# Patient Record
Sex: Female | Born: 1955
Health system: Southern US, Community
[De-identification: ages and names within clinical notes are randomized; demographics above are authoritative.]

## PROBLEM LIST (undated history)

## (undated) DIAGNOSIS — F1010 Alcohol abuse, uncomplicated: Secondary | ICD-10-CM

## (undated) DIAGNOSIS — J189 Pneumonia, unspecified organism: Secondary | ICD-10-CM

## (undated) DIAGNOSIS — K7460 Unspecified cirrhosis of liver: Secondary | ICD-10-CM

## (undated) DIAGNOSIS — N12 Tubulo-interstitial nephritis, not specified as acute or chronic: Secondary | ICD-10-CM

## (undated) DIAGNOSIS — K449 Diaphragmatic hernia without obstruction or gangrene: Secondary | ICD-10-CM

## (undated) DIAGNOSIS — K5730 Diverticulosis of large intestine without perforation or abscess without bleeding: Secondary | ICD-10-CM

## (undated) HISTORY — DX: Unspecified cirrhosis of liver: K74.60

## (undated) HISTORY — DX: Alcohol abuse, uncomplicated: F10.10

## (undated) HISTORY — DX: Diaphragmatic hernia without obstruction or gangrene: K44.9

## (undated) HISTORY — PX: TUBAL LIGATION: SHX77

## (undated) HISTORY — DX: Diverticulosis of large intestine without perforation or abscess without bleeding: K57.30

## (undated) HISTORY — DX: Pneumonia, unspecified organism: J18.9

## (undated) HISTORY — DX: Tubulo-interstitial nephritis, not specified as acute or chronic: N12

---

## 2007-06-13 ENCOUNTER — Ambulatory Visit: Payer: Self-pay | Admitting: Internal Medicine

## 2007-08-01 ENCOUNTER — Ambulatory Visit: Payer: Self-pay | Admitting: Internal Medicine

## 2007-08-06 ENCOUNTER — Ambulatory Visit: Payer: Self-pay | Admitting: Internal Medicine

## 2009-07-26 ENCOUNTER — Emergency Department: Payer: Self-pay | Admitting: Internal Medicine

## 2019-10-14 ENCOUNTER — Inpatient Hospital Stay (HOSPITAL_COMMUNITY)
Admission: EM | Admit: 2019-10-14 | Discharge: 2019-10-23 | DRG: 871 | Disposition: A | Discharge status: Home / Self Care | Payer: Self-pay | Attending: Student | Admitting: Student

## 2019-10-14 ENCOUNTER — Encounter (HOSPITAL_COMMUNITY): Payer: Self-pay

## 2019-10-14 ENCOUNTER — Other Ambulatory Visit: Payer: Self-pay

## 2019-10-14 ENCOUNTER — Emergency Department (HOSPITAL_COMMUNITY): Payer: Self-pay

## 2019-10-14 DIAGNOSIS — J9601 Acute respiratory failure with hypoxia: Secondary | ICD-10-CM | POA: Diagnosis present

## 2019-10-14 DIAGNOSIS — Z20822 Contact with and (suspected) exposure to covid-19: Secondary | ICD-10-CM | POA: Diagnosis present

## 2019-10-14 DIAGNOSIS — A419 Sepsis, unspecified organism: Secondary | ICD-10-CM

## 2019-10-14 DIAGNOSIS — F1010 Alcohol abuse, uncomplicated: Secondary | ICD-10-CM | POA: Diagnosis present

## 2019-10-14 DIAGNOSIS — J189 Pneumonia, unspecified organism: Secondary | ICD-10-CM | POA: Diagnosis present

## 2019-10-14 DIAGNOSIS — E8809 Other disorders of plasma-protein metabolism, not elsewhere classified: Secondary | ICD-10-CM | POA: Diagnosis present

## 2019-10-14 DIAGNOSIS — D509 Iron deficiency anemia, unspecified: Secondary | ICD-10-CM | POA: Diagnosis present

## 2019-10-14 DIAGNOSIS — K766 Portal hypertension: Secondary | ICD-10-CM | POA: Diagnosis present

## 2019-10-14 DIAGNOSIS — R188 Other ascites: Secondary | ICD-10-CM

## 2019-10-14 DIAGNOSIS — D72829 Elevated white blood cell count, unspecified: Secondary | ICD-10-CM

## 2019-10-14 DIAGNOSIS — K769 Liver disease, unspecified: Secondary | ICD-10-CM

## 2019-10-14 DIAGNOSIS — K5900 Constipation, unspecified: Secondary | ICD-10-CM | POA: Diagnosis present

## 2019-10-14 DIAGNOSIS — E872 Acidosis, unspecified: Secondary | ICD-10-CM

## 2019-10-14 DIAGNOSIS — E871 Hypo-osmolality and hyponatremia: Secondary | ICD-10-CM | POA: Diagnosis present

## 2019-10-14 DIAGNOSIS — K7040 Alcoholic hepatic failure without coma: Secondary | ICD-10-CM | POA: Diagnosis present

## 2019-10-14 DIAGNOSIS — D6959 Other secondary thrombocytopenia: Secondary | ICD-10-CM | POA: Diagnosis present

## 2019-10-14 DIAGNOSIS — E876 Hypokalemia: Secondary | ICD-10-CM | POA: Diagnosis present

## 2019-10-14 DIAGNOSIS — Z79899 Other long term (current) drug therapy: Secondary | ICD-10-CM

## 2019-10-14 DIAGNOSIS — D649 Anemia, unspecified: Secondary | ICD-10-CM

## 2019-10-14 DIAGNOSIS — R7989 Other specified abnormal findings of blood chemistry: Secondary | ICD-10-CM

## 2019-10-14 DIAGNOSIS — R17 Unspecified jaundice: Secondary | ICD-10-CM

## 2019-10-14 DIAGNOSIS — D696 Thrombocytopenia, unspecified: Secondary | ICD-10-CM

## 2019-10-14 DIAGNOSIS — R791 Abnormal coagulation profile: Secondary | ICD-10-CM

## 2019-10-14 DIAGNOSIS — D684 Acquired coagulation factor deficiency: Secondary | ICD-10-CM | POA: Diagnosis present

## 2019-10-14 DIAGNOSIS — E538 Deficiency of other specified B group vitamins: Secondary | ICD-10-CM | POA: Diagnosis present

## 2019-10-14 DIAGNOSIS — N179 Acute kidney failure, unspecified: Secondary | ICD-10-CM | POA: Diagnosis present

## 2019-10-14 DIAGNOSIS — K649 Unspecified hemorrhoids: Secondary | ICD-10-CM | POA: Diagnosis present

## 2019-10-14 DIAGNOSIS — F17210 Nicotine dependence, cigarettes, uncomplicated: Secondary | ICD-10-CM | POA: Diagnosis present

## 2019-10-14 DIAGNOSIS — R0902 Hypoxemia: Secondary | ICD-10-CM

## 2019-10-14 DIAGNOSIS — D539 Nutritional anemia, unspecified: Secondary | ICD-10-CM | POA: Diagnosis present

## 2019-10-14 DIAGNOSIS — K7031 Alcoholic cirrhosis of liver with ascites: Secondary | ICD-10-CM | POA: Diagnosis present

## 2019-10-14 DIAGNOSIS — A4151 Sepsis due to Escherichia coli [E. coli]: Principal | ICD-10-CM | POA: Diagnosis present

## 2019-10-14 MED ORDER — LACTATED RINGERS IV BOLUS (SEPSIS)
1000.0000 mL | Freq: Once | INTRAVENOUS | Status: AC
  Administered 2019-10-15: 1000 mL via INTRAVENOUS

## 2019-10-14 MED ORDER — VANCOMYCIN HCL IN DEXTROSE 1-5 GM/200ML-% IV SOLN
1000.0000 mg | Freq: Once | INTRAVENOUS | Status: AC
  Administered 2019-10-15: 1000 mg via INTRAVENOUS
  Filled 2019-10-14: qty 200

## 2019-10-14 MED ORDER — SODIUM CHLORIDE 0.9 % IV SOLN
2.0000 g | Freq: Once | INTRAVENOUS | Status: AC
  Administered 2019-10-15: 2 g via INTRAVENOUS
  Filled 2019-10-14: qty 2

## 2019-10-14 MED ORDER — METRONIDAZOLE IN NACL 5-0.79 MG/ML-% IV SOLN
500.0000 mg | Freq: Once | INTRAVENOUS | Status: AC
  Administered 2019-10-15: 500 mg via INTRAVENOUS
  Filled 2019-10-14: qty 100

## 2019-10-14 NOTE — ED Triage Notes (Signed)
Pt BIB [facility] from home c/o a fever x 2-3 days. Pt's daughter felt like she was more lethargic than normal and warm to the touch. EMS stated fire stated pt's temp was 103 with them and HR 150. Pt is also c/o of more abdominal distention than normal. Pt has a hx of ascites.

## 2019-10-14 NOTE — ED Provider Notes (Addendum)
[facility] EMERGENCY DEPARTMENT Provider Note   CSN: [id number] Arrival date & time: 10/14/19  2332    History Chief Complaint  Patient presents with  . Fever    Shirley Mays is a 64 y.o. female with no known past medical history who presents for evaluation of fever. Patient states she has had a fever at home x3 days. Has had a nonproductive cough. Feels like her abdomen has been larger than it normally has however denies any pain, nausea or vomiting. Per patient's daughter via EMS she felt like her mother was more lethargic. Patient was febrile with oral temp to 103 and heart rate to 150s. Per patient's daughter patient has history of ascites. Patient denies any complaints on initial evaluation. Tolerating p.o. intake at home. Has some lower extremity edema which he states is at baseline. Patient states she does not take any chronic medications and does not have a primary care provider.  History obtained patient past medical records.  No interpreter was used.  Tylenol given by EMS per nursing  Attempted to contact patient's daughter Shirley Mays at [phone].  Unable to reach x2 for collateral information.  HPI     History reviewed. No pertinent past medical history.  Patient Active Problem List   Diagnosis Date Noted  . CAP (community acquired pneumonia) 10/15/2019  . Sepsis (HCC) 10/15/2019  . Liver disease 10/15/2019  . Anemia 10/15/2019  . Thrombocytopenia (HCC) 10/15/2019    History reviewed. No pertinent surgical history.   OB History   No obstetric history on file.     History reviewed. No pertinent family history.  Social History   Tobacco Use  . Smoking status: Not on file  Substance Use Topics  . Alcohol use: Not on file  . Drug use: Not on file    Home Medications Prior to Admission medications   Not on File    Allergies    Patient has no known allergies.  Review of Systems   Review of Systems  Constitutional: Positive for  activity change, fatigue and fever. Negative for appetite change, chills and diaphoresis.  HENT: Negative.   Respiratory: Positive for cough. Negative for apnea, choking, chest tightness, shortness of breath, wheezing and stridor.   Cardiovascular: Negative.   Gastrointestinal: Positive for abdominal distention and nausea.  Genitourinary: Negative.   Musculoskeletal: Negative.   Skin: Negative.   Neurological: Positive for weakness (Generalized ). Negative for dizziness, tremors, seizures, syncope, facial asymmetry, speech difficulty, light-headedness, numbness and headaches.  All other systems reviewed and are negative.   Physical Exam Updated Vital Signs BP 102/62   Pulse (!) 107   Temp (!) 100.7 F (38.2 C) (Oral)   Resp (!) 24   Ht 5\' 7"  (1.702 m)   Wt 90.7 kg   SpO2 93%   BMI 31.32 kg/m   Physical Exam Vitals and nursing note reviewed.  Constitutional:      General: She is not in acute distress.    Appearance: She is well-developed. She is ill-appearing. She is not toxic-appearing.  HENT:     Head: Normocephalic and atraumatic.     Nose: Nose normal.     Mouth/Throat:     Mouth: Mucous membranes are dry.  Eyes:     Pupils: Pupils are equal, round, and reactive to light.     Comments: Scleral icterus   Cardiovascular:     Rate and Rhythm: Tachycardia present.     Pulses: Normal pulses.     Heart  sounds: Normal heart sounds.  Pulmonary:     Effort: Pulmonary effort is normal. No respiratory distress.     Breath sounds: Normal breath sounds.     Comments: Speaks in full sentences without difficulty. 2 L via nasal cannula for supplemental oxygen, does not use chronically at home Abdominal:     General: Bowel sounds are normal. There is distension.     Tenderness: There is no abdominal tenderness. There is no guarding or rebound.     Comments: Distended abdomen however soft, nontender.  Musculoskeletal:        General: Normal range of motion.     Cervical back:  Normal range of motion.     Comments: Moves all four extremities at difficulty. Denna HaggardHomans' sign negative. Compartments soft.  Skin:    General: Skin is warm and dry.     Capillary Refill: Capillary refill takes 2 to 3 seconds.     Comments: Jaundice. 1+ bilateral pitting edema to mid shins no erythema or warmth.  Neurological:     Mental Status: She is alert.     Comments: AxO x3. Cranial nerves II through XII grossly intact No asterixis 4.5/5 strength to bilateral upper and lower extremities without difficulty     ED Results / Procedures / Treatments   Labs (all labs ordered are listed, but only abnormal results are displayed) Labs Reviewed  LACTIC ACID, PLASMA - Abnormal; Notable for the following components:      Result Value   Lactic Acid, Venous 4.4 (*)    All other components within normal limits  LACTIC ACID, PLASMA - Abnormal; Notable for the following components:   Lactic Acid, Venous 3.2 (*)    All other components within normal limits  COMPREHENSIVE METABOLIC PANEL - Abnormal; Notable for the following components:   Sodium 128 (*)    Potassium 3.2 (*)    Chloride 93 (*)    CO2 20 (*)    Glucose, Bld 106 (*)    BUN 27 (*)    Creatinine, Ser 1.29 (*)    Calcium 8.0 (*)    Albumin 2.2 (*)    AST 162 (*)    Total Bilirubin 4.8 (*)    GFR calc non Af Amer 44 (*)    GFR calc Af Amer 51 (*)    All other components within normal limits  CBC WITH DIFFERENTIAL/PLATELET - Abnormal; Notable for the following components:   WBC 17.8 (*)    RBC 2.98 (*)    Hemoglobin 11.1 (*)    HCT 32.4 (*)    MCV 108.7 (*)    MCH 37.2 (*)    RDW 16.6 (*)    Platelets 123 (*)    Neutro Abs 14.0 (*)    Monocytes Absolute 2.3 (*)    Abs Immature Granulocytes 0.19 (*)    All other components within normal limits  APTT - Abnormal; Notable for the following components:   aPTT 47 (*)    All other components within normal limits  PROTIME-INR - Abnormal; Notable for the following  components:   Prothrombin Time 24.6 (*)    INR 2.3 (*)    All other components within normal limits  AMMONIA - Abnormal; Notable for the following components:   Ammonia 76 (*)    All other components within normal limits  SARS CORONAVIRUS 2 BY RT PCR (HOSPITAL ORDER, PERFORMED IN [facility])  CULTURE, BLOOD (ROUTINE X 2)  CULTURE, BLOOD (ROUTINE X 2)  URINE CULTURE  LIPASE, BLOOD  PROCALCITONIN  URINALYSIS, ROUTINE W REFLEX MICROSCOPIC  BRAIN NATRIURETIC PEPTIDE  HIV ANTIBODY (ROUTINE TESTING W REFLEX)  VITAMIN B12  FOLATE  CBC  OSMOLALITY  SODIUM, URINE, RANDOM  OSMOLALITY, URINE  BASIC METABOLIC PANEL  MAGNESIUM  PROTIME-INR    EKG EKG Interpretation  Date/Time:  Tuesday October 14 2019 23:52:42 EDT Ventricular Rate:  115 PR Interval:    QRS Duration: 93 QT Interval:  364 QTC Calculation: 504 R Axis:   32 Text Interpretation: Sinus tachycardia Borderline low voltage, extremity leads Prolonged QT interval Confirmed by Zadie Rhine ([id number]) on 10/14/2019 11:53:38 PM   Radiology DG Chest Port 1 View  Result Date: 10/15/2019 CLINICAL DATA:  Cough EXAM: PORTABLE CHEST 1 VIEW COMPARISON:  July 18, 2009 FINDINGS: There is mild cardiomegaly. Hazy patchy airspace consolidation seen at the left lung base. Mildly increased interstitial markings seen within the left upper lung. The right lung is clear. No acute osseous abnormality. IMPRESSION: Hazy patchy airspace consolidation at the left lung base, concerning for pneumonia. Electronically Signed   By: Jonna Clark M.D.   On: 10/15/2019 00:21    Procedures .Critical Care Performed by: Linwood Dibbles, PA-C Authorized by: Linwood Dibbles, PA-C   Critical care provider statement:    Critical care time (minutes):  45   Critical care time was exclusive of:  Separately billable procedures and treating other patients and teaching time   Critical care was necessary to treat or prevent imminent or  life-threatening deterioration of the following conditions:  Sepsis, dehydration and hepatic failure   Critical care was time spent personally by me on the following activities:  Discussions with consultants, evaluation of patient's response to treatment, examination of patient, ordering and performing treatments and interventions, ordering and review of laboratory studies, ordering and review of radiographic studies, pulse oximetry, re-evaluation of patient's condition, obtaining history from patient or surrogate and review of old charts   (including critical care time)  Medications Ordered in ED Medications  vancomycin (VANCOCIN) IVPB 1000 mg/200 mL premix (1,000 mg Intravenous New Bag/Given 10/15/19 0215)  potassium chloride 10 mEq in 100 mL IVPB (has no administration in time range)  cefTRIAXone (ROCEPHIN) 1 g in sodium chloride 0.9 % 100 mL IVPB (has no administration in time range)  azithromycin (ZITHROMAX) 500 mg in sodium chloride 0.9 % 250 mL IVPB (has no administration in time range)  LORazepam (ATIVAN) tablet 1-4 mg (has no administration in time range)    Or  LORazepam (ATIVAN) injection 1-4 mg (has no administration in time range)  thiamine tablet 100 mg (has no administration in time range)    Or  thiamine (B-1) injection 100 mg (has no administration in time range)  folic acid (FOLVITE) tablet 1 mg (has no administration in time range)  multivitamin with minerals tablet 1 tablet (has no administration in time range)  0.9 %  sodium chloride infusion (has no administration in time range)  acetaminophen (TYLENOL) tablet 650 mg (has no administration in time range)    Or  acetaminophen (TYLENOL) suppository 650 mg (has no administration in time range)  lactated ringers bolus 1,000 mL (0 mLs Intravenous Stopped 10/15/19 0142)  ceFEPIme (MAXIPIME) 2 g in sodium chloride 0.9 % 100 mL IVPB (0 g Intravenous Stopped 10/15/19 0116)  metroNIDAZOLE (FLAGYL) IVPB 500 mg (0 mg Intravenous  Stopped 10/15/19 0243)  sodium chloride 0.9 % bolus 2,000 mL (2,000 mLs Intravenous Bolus from Bag 10/15/19 0143)   ED Course  I have  reviewed the triage vital signs and the nursing notes.  Pertinent labs & imaging results that were available during my care of the patient were reviewed by me and considered in my medical decision making (see chart for details).  64 year old female presents for evaluation of fever.  On arrival she is febrile, tachycardic, tachypneic with oxygen saturation at 90% on room air.  Patient states she has had a cough.  No known Covid contacts.  On arrival code sepsis was called with broad-spectrum antibiotics.  Does have some abdominal distention however is nontender to palpation.  No diarrhea or urinary complaints.  She does have scleral icterus and appears jaundiced.  Admits to chronic alcohol use.  Last use yesterday.  Denies prior DTs or withdrawal seizures.  Per daughter via EMS patient has history of ascites however patient denies any known liver issues.  She is not followed by PCP.  Plan on labs, imaging and reassess.  Started with 1 L of fluids pending lactic.  We will add an additional fluids if MAP trends downwards her lactic acid is greater than 4.  Patient mentating appropriately.  AxO x4. Tylenol given by EMS PTA.  Labs and imaging personally viewed and interpreted: CBC with leukocytosis at 17.8, hemoglobin 16.1 Metabolic panel with hyponatremia to 128, hypokalemia, AKI at 1.29, T bili 4.8 Ammonia 76 Lipase 50 Lactic acid 4.4--> once lactic acid resulted, remaining fluids to total 30/cc/kg bolus consistent with sepsis order set ordered. Trending down to 3.2 Covid negative Procalcitonin 3.31 INR 2.3, patient denies anticoagulation Chest x-ray with consolidation in the left lung base consistent with pneumonia EKG tachycardia, prolonged QT interval  Patient reassessed. States she lives with her daughter Shirley Mays. I try to contact her x2 for additional collateral  information however patient's contact is not answering the telephone. Patient again denies any chronic medical conditions or per EMS has history of ascites, suspect her elevated INR, bili from liver failure. Has prior history of DTs or withdrawal seizures however admits to chronic alcohol use, CIWA order set monitor for possible withdrawal however does not appear to be actively withdrawing from alcohol at this time.  Patient with elevated ammonia however no asterixis on exam. She is mentating appropriately. Abdomen distended however soft. Low suspicion for SB or additional abdominal pathology given nontender abdomen on exam. Patient without any chest pain, no evidence of DVT on exam. Low suspicion for ACS, PE, dissection as cause of her abnormal vital signs. No evidence of septic shock at this time.  Sepsis - Repeat Assessment  Performed at:      Vitals     Blood pressure 102/62, pulse (!) 107, temperature (!) 100.7 F (38.2 C), temperature source Oral, resp. rate (!) 24, height 5\' 7"  (1.702 m), weight 90.7 kg, SpO2 93 %.  Heart:     Tachycardic  Lungs:    Rhonchi  Capillary Refill:   > 2 sec  Peripheral Pulse:   Radial pulse palpable  Skin:     Jaundice  Patient reassessed. Discussed lab and imaging findings. Discussed admission. She is agreeable to this. Patient will be admitted for sepsis likely due to pneumonia on chest x-ray. Will also need further evaluation for her possible liver failure. She has been given broad-spectrum antibiotics and 30 cc/kg bolus. Patient with maps greater than 65. Electolytes replaced in ED.  CONSULT with Dr. Marlowe Sax with [facility] who will evaluate patient for admission  The patient appears reasonably stabilized for admission considering the current resources, flow, and capabilities available in  the ED at this time, and I doubt any other [facility] requiring further screening and/or treatment in the ED prior to admission.  Attending Dr. Bebe Shaggy has evaluated patient. He  agrees above treatment, plan and disposition.    MDM Rules/Calculators/A&P                           Final Clinical Impression(s) / ED Diagnoses Final diagnoses:  Sepsis with acute hypoxic respiratory failure without septic shock, due to unspecified organism (HCC)  Elevated INR  Elevated bilirubin  Hyponatremia  Lactic acidosis  Hypokalemia  AKI (acute kidney injury) (HCC)  Elevated LFTs    Rx / DC Orders ED Discharge Orders    None       Terence Googe A, PA-C 10/15/19 0255    Mehar Sagen A, PA-C 10/15/19 0256    Zadie Rhine, MD 10/15/19 [phone]

## 2019-10-15 ENCOUNTER — Inpatient Hospital Stay (HOSPITAL_COMMUNITY): Payer: Self-pay

## 2019-10-15 ENCOUNTER — Emergency Department (HOSPITAL_COMMUNITY): Payer: Self-pay

## 2019-10-15 ENCOUNTER — Encounter (HOSPITAL_COMMUNITY): Payer: Self-pay | Admitting: Internal Medicine

## 2019-10-15 DIAGNOSIS — A419 Sepsis, unspecified organism: Secondary | ICD-10-CM

## 2019-10-15 DIAGNOSIS — K769 Liver disease, unspecified: Secondary | ICD-10-CM

## 2019-10-15 DIAGNOSIS — D649 Anemia, unspecified: Secondary | ICD-10-CM

## 2019-10-15 DIAGNOSIS — D696 Thrombocytopenia, unspecified: Secondary | ICD-10-CM

## 2019-10-15 DIAGNOSIS — J189 Pneumonia, unspecified organism: Secondary | ICD-10-CM | POA: Diagnosis present

## 2019-10-15 LAB — LACTIC ACID, PLASMA
Lactic Acid, Venous: 4.4 mmol/L (ref 0.5–1.9)
Lactic Acid, Venous: 3.2 mmol/L (ref 0.5–1.9)
Lactic Acid, Venous: 2.4 mmol/L (ref 0.5–1.9)

## 2019-10-15 LAB — CBC WITH DIFFERENTIAL/PLATELET
Abs Immature Granulocytes: 0.19 10*3/uL — ABNORMAL HIGH (ref 0.00–0.07)
Basophils Absolute: 0.1 10*3/uL (ref 0.0–0.1)
Basophils Relative: 0 %
Eosinophils Absolute: 0.1 10*3/uL (ref 0.0–0.5)
Eosinophils Relative: 0 %
HCT: 32.4 % — ABNORMAL LOW (ref 36.0–46.0)
Hemoglobin: 11.1 g/dL — ABNORMAL LOW (ref 12.0–15.0)
Immature Granulocytes: 1 %
Lymphocytes Relative: 7 %
Lymphs Abs: 1.3 10*3/uL (ref 0.7–4.0)
MCH: 37.2 pg — ABNORMAL HIGH (ref 26.0–34.0)
MCHC: 34.3 g/dL (ref 30.0–36.0)
MCV: 108.7 fL — ABNORMAL HIGH (ref 80.0–100.0)
Monocytes Absolute: 2.3 10*3/uL — ABNORMAL HIGH (ref 0.1–1.0)
Monocytes Relative: 13 %
Neutro Abs: 14 10*3/uL — ABNORMAL HIGH (ref 1.7–7.7)
Neutrophils Relative %: 79 %
Platelets: 123 10*3/uL — ABNORMAL LOW (ref 150–400)
RBC: 2.98 MIL/uL — ABNORMAL LOW (ref 3.87–5.11)
RDW: 16.6 % — ABNORMAL HIGH (ref 11.5–15.5)
WBC: 17.8 10*3/uL — ABNORMAL HIGH (ref 4.0–10.5)
nRBC: 0.1 % (ref 0.0–0.2)

## 2019-10-15 LAB — CBC
HCT: 31.5 % — ABNORMAL LOW (ref 36.0–46.0)
Hemoglobin: 10.8 g/dL — ABNORMAL LOW (ref 12.0–15.0)
MCH: 37.1 pg — ABNORMAL HIGH (ref 26.0–34.0)
MCHC: 34.3 g/dL (ref 30.0–36.0)
MCV: 108.2 fL — ABNORMAL HIGH (ref 80.0–100.0)
Platelets: 112 10*3/uL — ABNORMAL LOW (ref 150–400)
RBC: 2.91 MIL/uL — ABNORMAL LOW (ref 3.87–5.11)
RDW: 16.6 % — ABNORMAL HIGH (ref 11.5–15.5)
WBC: 16.5 10*3/uL — ABNORMAL HIGH (ref 4.0–10.5)
nRBC: 0.1 % (ref 0.0–0.2)

## 2019-10-15 LAB — SARS CORONAVIRUS 2 BY RT PCR (HOSPITAL ORDER, PERFORMED IN ~~LOC~~ HOSPITAL LAB): SARS Coronavirus 2: NEGATIVE

## 2019-10-15 LAB — BASIC METABOLIC PANEL
Anion gap: 9 (ref 5–15)
BUN: 23 mg/dL (ref 8–23)
CO2: 23 mmol/L (ref 22–32)
Calcium: 7.6 mg/dL — ABNORMAL LOW (ref 8.9–10.3)
Chloride: 98 mmol/L (ref 98–111)
Creatinine, Ser: 1.09 mg/dL — ABNORMAL HIGH (ref 0.44–1.00)
GFR calc Af Amer: >60 mL/min (ref >60)
GFR calc non Af Amer: 54 mL/min — ABNORMAL LOW (ref >60)
Glucose, Bld: 111 mg/dL — ABNORMAL HIGH (ref 70–99)
Potassium: 3 mmol/L — ABNORMAL LOW (ref 3.5–5.1)
Sodium: 130 mmol/L — ABNORMAL LOW (ref 135–145)

## 2019-10-15 LAB — BLOOD CULTURE ID PANEL (REFLEXED)

## 2019-10-15 LAB — COMPREHENSIVE METABOLIC PANEL
ALT: 41 U/L (ref 0–44)
AST: 162 U/L — ABNORMAL HIGH (ref 15–41)
Albumin: 2.2 g/dL — ABNORMAL LOW (ref 3.5–5.0)
Alkaline Phosphatase: 50 U/L (ref 38–126)
Anion gap: 15 (ref 5–15)
BUN: 27 mg/dL — ABNORMAL HIGH (ref 8–23)
CO2: 20 mmol/L — ABNORMAL LOW (ref 22–32)
Calcium: 8 mg/dL — ABNORMAL LOW (ref 8.9–10.3)
Chloride: 93 mmol/L — ABNORMAL LOW (ref 98–111)
Creatinine, Ser: 1.29 mg/dL — ABNORMAL HIGH (ref 0.44–1.00)
GFR calc Af Amer: 51 mL/min — ABNORMAL LOW (ref >60)
GFR calc non Af Amer: 44 mL/min — ABNORMAL LOW (ref >60)
Glucose, Bld: 106 mg/dL — ABNORMAL HIGH (ref 70–99)
Potassium: 3.2 mmol/L — ABNORMAL LOW (ref 3.5–5.1)
Sodium: 128 mmol/L — ABNORMAL LOW (ref 135–145)
Total Bilirubin: 4.8 mg/dL — ABNORMAL HIGH (ref 0.3–1.2)
Total Protein: 6.8 g/dL (ref 6.5–8.1)

## 2019-10-15 LAB — PROTIME-INR
INR: 2.3 — ABNORMAL HIGH (ref 0.8–1.2)
INR: 2.4 — ABNORMAL HIGH (ref 0.8–1.2)
Prothrombin Time: 24.6 seconds — ABNORMAL HIGH (ref 11.4–15.2)
Prothrombin Time: 25.7 seconds — ABNORMAL HIGH (ref 11.4–15.2)

## 2019-10-15 LAB — VITAMIN B12: Vitamin B-12: 500 pg/mL (ref 180–914)

## 2019-10-15 LAB — MAGNESIUM: Magnesium: 1.4 mg/dL — ABNORMAL LOW (ref 1.7–2.4)

## 2019-10-15 LAB — AMMONIA: Ammonia: 76 umol/L — ABNORMAL HIGH (ref 9–35)

## 2019-10-15 LAB — BRAIN NATRIURETIC PEPTIDE: B Natriuretic Peptide: 114.9 pg/mL — ABNORMAL HIGH (ref 0.0–100.0)

## 2019-10-15 LAB — PROCALCITONIN: Procalcitonin: 3.31 ng/mL

## 2019-10-15 LAB — APTT: aPTT: 47 seconds — ABNORMAL HIGH (ref 24–36)

## 2019-10-15 LAB — OSMOLALITY: Osmolality: 279 mOsm/kg (ref 275–295)

## 2019-10-15 LAB — LIPASE, BLOOD: Lipase: 50 U/L (ref 11–51)

## 2019-10-15 LAB — FOLATE: Folate: 5.9 ng/mL — ABNORMAL LOW (ref >5.9)

## 2019-10-15 LAB — GLUCOSE, CAPILLARY: Glucose-Capillary: 129 mg/dL — ABNORMAL HIGH (ref 70–99)

## 2019-10-15 LAB — HIV ANTIBODY (ROUTINE TESTING W REFLEX): HIV Screen 4th Generation wRfx: NONREACTIVE

## 2019-10-15 MED ORDER — ADULT MULTIVITAMIN W/MINERALS CH
1.0000 | ORAL_TABLET | Freq: Every day | ORAL | Status: DC
  Administered 2019-10-15 – 2019-10-23 (×9): 1 via ORAL
  Filled 2019-10-15 (×9): qty 1

## 2019-10-15 MED ORDER — POTASSIUM CHLORIDE 10 MEQ/100ML IV SOLN: 10.0000 meq | INTRAVENOUS | Status: AC

## 2019-10-15 MED ORDER — ACETAMINOPHEN 325 MG PO TABS
650.0000 mg | ORAL_TABLET | Freq: Four times a day (QID) | ORAL | Status: DC | PRN
  Administered 2019-10-15 – 2019-10-23 (×5): 650 mg via ORAL
  Filled 2019-10-15 (×5): qty 2

## 2019-10-15 MED ORDER — LORAZEPAM 1 MG PO TABS: 1.0000 mg | ORAL_TABLET | ORAL | Status: AC | PRN

## 2019-10-15 MED ORDER — LORAZEPAM 2 MG/ML IJ SOLN: 1.0000 mg | INTRAMUSCULAR | Status: AC | PRN

## 2019-10-15 MED ORDER — SODIUM CHLORIDE 0.9 % IV SOLN: INTRAVENOUS | Status: DC

## 2019-10-15 MED ORDER — SODIUM CHLORIDE 0.9% FLUSH: 10.0000 mL | INTRAVENOUS | Status: DC | PRN

## 2019-10-15 MED ORDER — SODIUM CHLORIDE 0.9 % IV BOLUS
500.0000 mL | Freq: Once | INTRAVENOUS | Status: AC
  Administered 2019-10-15: 500 mL via INTRAVENOUS

## 2019-10-15 MED ORDER — LACTULOSE 10 GM/15ML PO SOLN
30.0000 g | Freq: Two times a day (BID) | ORAL | Status: DC
  Administered 2019-10-15 (×2): 30 g via ORAL
  Filled 2019-10-15 (×3): qty 45

## 2019-10-15 MED ORDER — GUAIFENESIN-DM 100-10 MG/5ML PO SYRP: 5.0000 mL | ORAL_SOLUTION | Freq: Four times a day (QID) | ORAL | Status: DC | PRN

## 2019-10-15 MED ORDER — PHYTONADIONE 5 MG PO TABS
10.0000 mg | ORAL_TABLET | Freq: Once | ORAL | Status: AC
  Administered 2019-10-15: 10 mg via ORAL
  Filled 2019-10-15: qty 2

## 2019-10-15 MED ORDER — SODIUM CHLORIDE 0.9 % IV BOLUS
2000.0000 mL | Freq: Once | INTRAVENOUS | Status: AC
  Administered 2019-10-15: 2000 mL via INTRAVENOUS

## 2019-10-15 MED ORDER — MAGNESIUM SULFATE 2 GM/50ML IV SOLN
2.0000 g | Freq: Once | INTRAVENOUS | Status: AC
  Administered 2019-10-15: 2 g via INTRAVENOUS
  Filled 2019-10-15: qty 50

## 2019-10-15 MED ORDER — THIAMINE HCL 100 MG PO TABS
100.0000 mg | ORAL_TABLET | Freq: Every day | ORAL | Status: DC
  Administered 2019-10-15 – 2019-10-23 (×9): 100 mg via ORAL
  Filled 2019-10-15 (×9): qty 1

## 2019-10-15 MED ORDER — FOLIC ACID 1 MG PO TABS
1.0000 mg | ORAL_TABLET | Freq: Every day | ORAL | Status: DC
  Administered 2019-10-15 – 2019-10-23 (×9): 1 mg via ORAL
  Filled 2019-10-15 (×9): qty 1

## 2019-10-15 MED ORDER — POTASSIUM CHLORIDE CRYS ER 20 MEQ PO TBCR
40.0000 meq | EXTENDED_RELEASE_TABLET | Freq: Two times a day (BID) | ORAL | Status: AC
  Administered 2019-10-15 (×2): 40 meq via ORAL
  Filled 2019-10-15 (×2): qty 2

## 2019-10-15 MED ORDER — ACETAMINOPHEN 650 MG RE SUPP: 650.0000 mg | Freq: Four times a day (QID) | RECTAL | Status: DC | PRN

## 2019-10-15 MED ORDER — SODIUM CHLORIDE 0.9 % IV SOLN
2.0000 g | INTRAVENOUS | Status: DC
  Administered 2019-10-15 – 2019-10-19 (×5): 2 g via INTRAVENOUS
  Filled 2019-10-15: qty 20
  Filled 2019-10-15 (×2): qty 2
  Filled 2019-10-15 (×2): qty 20
  Filled 2019-10-15: qty 2
  Filled 2019-10-15: qty 20

## 2019-10-15 MED ORDER — THIAMINE HCL 100 MG/ML IJ SOLN: 100.0000 mg | Freq: Every day | INTRAMUSCULAR | Status: DC

## 2019-10-15 MED ORDER — SODIUM CHLORIDE 0.9 % IV SOLN: 2.0000 g | Freq: Two times a day (BID) | INTRAVENOUS | Status: DC

## 2019-10-15 MED ORDER — SODIUM CHLORIDE 0.9 % IV BOLUS
250.0000 mL | Freq: Once | INTRAVENOUS | Status: AC
  Administered 2019-10-15: 250 mL via INTRAVENOUS

## 2019-10-15 MED ORDER — SODIUM CHLORIDE 0.9 % IV SOLN
500.0000 mg | INTRAVENOUS | Status: DC
  Administered 2019-10-15 – 2019-10-19 (×5): 500 mg via INTRAVENOUS
  Filled 2019-10-15 (×5): qty 500

## 2019-10-15 MED ORDER — VANCOMYCIN HCL 1250 MG/250ML IV SOLN: 1250.0000 mg | INTRAVENOUS | Status: DC

## 2019-10-15 MED ORDER — SODIUM CHLORIDE 0.9 % IV SOLN
1.0000 g | INTRAVENOUS | Status: DC
  Filled 2019-10-15: qty 10

## 2019-10-15 NOTE — Progress Notes (Signed)
Pharmacy Antibiotic Note  Shirley Mays is a 64 y.o. female admitted on 10/14/2019 with sepsis.  Pharmacy has been consulted for vancomycin and cefepime dosing. Vancomycin 1gm and cefepime 2gm ordered in the ED  Plan: Continue vancomycin 1250 mg IV q24 hours Cont cefepime 2gm IV q12 hours F/u renal function, cultures and clinical course  Height: 5\' 7"  (170.2 cm) Weight: 90.7 kg (200 lb) IBW/kg (Calculated) : 61.6  Temp (24hrs), Avg:100.7 F (38.2 C), Min:100.7 F (38.2 C), Max:100.7 F (38.2 C)  Recent Labs  Lab 10/14/19 2357  WBC 17.8*  CREATININE 1.29*  LATICACIDVEN 4.4*    Estimated Creatinine Clearance: 50.9 mL/min (A) (by C-G formula based on SCr of 1.29 mg/dL (H)).    No Known Allergies  Thank you for allowing pharmacy to be a part of this patient's care.  10/16/19 [place] 10/15/2019 1:24 AM

## 2019-10-15 NOTE — ED Provider Notes (Signed)
Patient seen/examined in the Emergency Department in conjunction with Advanced Practice Provider  Patient reports fever and fatigue Exam : Awake alert, no acute distress.  Patient is febrile and tachycardic Plan: Code sepsis has been called.  We will follow closely    Zadie Rhine, MD 10/15/19 0006

## 2019-10-15 NOTE — H&P (Signed)
History and Physical    Shirley Mays BEE:[id number] DOB: 24-Jan-1956 DOA: 10/14/2019  PCP: Patient, No Pcp Per Patient coming from: Home  Chief Complaint: Fever, lethargy  HPI: Shirley Mays is a 64 y.o. female with no documented past medical history presenting to the ED via EMS for evaluation of fever and lethargy.  Temperature 103 F with EMS and heart rate in the 150s.  Daughter told EMS that patient has a history of ascites.  Patient reports 4-day history of fevers, chills, cough, shortness of breath, and generalized weakness.  Reports longstanding history of daily alcohol use, 30+ years.  ED Course: Febrile, tachycardic, and tachypneic.  Not hypotensive.  Oxygen saturation 88% on room air, placed on 4 L supplemental oxygen.  Labs showing leukocytosis with WBC count 17.8.  Lactic acid 4.4.  Procalcitonin elevated at 3.31.  Hemoglobin 11.1 and MCV 108.  Platelet count 123.  No prior labs for comparison.  Sodium 128, potassium 3.2, BUN 27, creatinine 1.2.  AST 162 and T bili 4.8.  ALT and alk phos normal.  Lipase normal.  INR 2.3.  Ammonia slightly elevated at 76.  UA and urine culture pending.  Blood culture x2 pending.  SARS-CoV-2 PCR test negative.  Chest x-ray personally reviewed showing consolidation at the left lung base concerning for pneumonia.  Patient was given vancomycin, cefepime, metronidazole, and 3 L IV fluid boluses.  Review of Systems:  All systems reviewed and apart from history of presenting illness, are negative.  History reviewed. No pertinent past medical history.  Past Surgical History:  Procedure Laterality Date  . TUBAL LIGATION       reports that she has been smoking cigarettes. She has never used smokeless tobacco. She reports current alcohol use. She reports that she does not use drugs.  No Known Allergies  History reviewed. No pertinent family history.  Prior to Admission medications   Not on File    Physical Exam: Vitals:   10/15/19 0145 10/15/19 0200  10/15/19 0230 10/15/19 0250  BP: (!) 106/56 104/62 92/61 102/62  Pulse: (!) 103 (!) 103 99 (!) 107  Resp: (!) 22 20 (!) 24   Temp:      TempSrc:      SpO2: 95% 95% 93%   Weight:      Height:        Physical Exam Constitutional:      General: She is not in acute distress.    Appearance: She is ill-appearing. She is not diaphoretic.  HENT:     Head: Normocephalic.     Mouth/Throat:     Mouth: Mucous membranes are dry.  Eyes:     Extraocular Movements: Extraocular movements intact.  Cardiovascular:     Rate and Rhythm: Regular rhythm. Tachycardia present.     Pulses: Normal pulses.  Pulmonary:     Effort: Pulmonary effort is normal.     Breath sounds: No wheezing.     Comments: Decreased breath sounds at the left lung base Abdominal:     General: Bowel sounds are normal. There is distension.     Palpations: Abdomen is soft.     Tenderness: There is no abdominal tenderness. There is no guarding.     Comments: Mild distention  Musculoskeletal:     Cervical back: Normal range of motion and neck supple.     Right lower leg: Edema present.     Left lower leg: Edema present.     Comments: +3 pedal edema bilaterally  Skin:  General: Skin is warm and dry.  Neurological:     Mental Status: She is alert and oriented to person, place, and time.     Labs on Admission: I have personally reviewed following labs and imaging studies  CBC: Recent Labs  Lab 10/14/19 2357  WBC 17.8*  NEUTROABS 14.0*  HGB 11.1*  HCT 32.4*  MCV 108.7*  PLT 786*   Basic Metabolic Panel: Recent Labs  Lab 10/14/19 2357  NA 128*  K 3.2*  CL 93*  CO2 20*  GLUCOSE 106*  BUN 27*  CREATININE 1.29*  CALCIUM 8.0*   GFR: Estimated Creatinine Clearance: 50.9 mL/min (A) (by C-G formula based on SCr of 1.29 mg/dL (H)). Liver Function Tests: Recent Labs  Lab 10/14/19 2357  AST 162*  ALT 41  ALKPHOS 50  BILITOT 4.8*  PROT 6.8  ALBUMIN 2.2*   Recent Labs  Lab 10/14/19 2357  LIPASE 50     Recent Labs  Lab 10/14/19 2357  AMMONIA 76*   Coagulation Profile: Recent Labs  Lab 10/14/19 2357  INR 2.3*   Cardiac Enzymes: No results for input(s): CKTOTAL, CKMB, CKMBINDEX, TROPONINI in the last 168 hours. BNP (last 3 results) No results for input(s): PROBNP in the last 8760 hours. HbA1C: No results for input(s): HGBA1C in the last 72 hours. CBG: No results for input(s): GLUCAP in the last 168 hours. Lipid Profile: No results for input(s): CHOL, HDL, LDLCALC, TRIG, CHOLHDL, LDLDIRECT in the last 72 hours. Thyroid Function Tests: No results for input(s): TSH, T4TOTAL, FREET4, T3FREE, THYROIDAB in the last 72 hours. Anemia Panel: No results for input(s): VITAMINB12, FOLATE, FERRITIN, TIBC, IRON, RETICCTPCT in the last 72 hours. Urine analysis: No results found for: COLORURINE, APPEARANCEUR, [place], [place], [place], [place], [place], [place], [place], [place], [place], [place]  Radiological Exams on Admission: DG Chest Port 1 View  Result Date: 10/15/2019 CLINICAL DATA:  Cough EXAM: PORTABLE CHEST 1 VIEW COMPARISON:  July 18, 2009 FINDINGS: There is mild cardiomegaly. Hazy patchy airspace consolidation seen at the left lung base. Mildly increased interstitial markings seen within the left upper lung. The right lung is clear. No acute osseous abnormality. IMPRESSION: Hazy patchy airspace consolidation at the left lung base, concerning for pneumonia. Electronically Signed   By: Prudencio Pair M.D.   On: 10/15/2019 00:21    EKG: Independently reviewed.  Sinus tachycardia, QTC 504.  Assessment/Plan Principal Problem:   CAP (community acquired pneumonia) Active Problems:   Sepsis ([place])   Liver disease   Anemia   Thrombocytopenia ([place])   Sepsis and acute hypoxemic respiratory failure secondary to community-acquired pneumonia: Febrile, tachycardic, and tachypneic.  Not hypotensive.  Oxygen saturation 88% on room air, currently satting well on 4 L  supplemental oxygen WBC count 17.8.  Lactic acid 4.4.  Procalcitonin elevated at 3.31.  SARS-CoV-2 PCR test negative.  Chest x-ray personally reviewed showing consolidation at the left lung base concerning for pneumonia. -Ceftriaxone and azithromycin.  Patient received IV fluid boluses per sepsis protocol, continue IV fluid hydration.  Blood culture x2 pending. Trend lactate. Continue to monitor CBC. -Continuous pulse ox, supplemental oxygen to keep oxygen saturation above 92%  Liver disease/ coagulopathy: Has mild abdominal distention on exam.  AST 162 and T bili 4.8.  ALT and alk phos normal. INR 2.3.  Ammonia slightly elevated at 76.  Could possibly have underlying liver cirrhosis given history of alcohol use. -Right upper quadrant ultrasound. Hold lactulose at this time as she does not appear encephalopathic.  Continue to monitor  PT/INR.  Alcohol use disorder: Patient reports longstanding history of daily alcohol use.  Currently slightly tachycardic in the setting of sepsis. -CIWA protocol; Ativan as needed. Thiamine, folate, and multivitamin.  Macrocytic anemia: Hemoglobin 11.1 and MCV 108.  Likely related to alcohol use. -Check folate and B12 levels  Mild thrombocytopenia: Platelet count 123.  Likely related to alcohol use and underlying liver disease. -Continue to monitor  Hyponatremia: Possibly related to underlying liver disease in the setting of alcohol use.  Sodium 128, no prior labs for comparison. -IV fluid hydration.  Check serum osmolarity, urine sodium and urine osmolarity.  Repeat BMP in a.m.  Hypokalemia: Potassium 3.2. -Replete potassium.  Check magnesium level and replete if low.  Continue to monitor electrolytes.  DVT prophylaxis: SCDs given elevated INR Code Status: Full code Family Communication: No family available this time. Disposition Plan: Status is: Inpatient  Remains inpatient appropriate because:Ongoing diagnostic testing needed not appropriate for outpatient  work up, IV treatments appropriate due to intensity of illness or inability to take PO and Inpatient level of care appropriate due to severity of illness   Dispo: The patient is from: Home              Anticipated d/c is to: Home              Anticipated d/c date is: 3 days              Patient currently is not medically stable to d/c.  The medical decision making on this patient was of high complexity and the patient is at high risk for clinical deterioration, therefore this is a level 3 visit.  Shela Leff MD [facility]  If 7PM-7AM, please contact night-coverage [email or web address]  10/15/2019, 3:09 AM

## 2019-10-15 NOTE — Progress Notes (Signed)
PROGRESS NOTE    Shirley Mays  NWG:[id number] DOB: 1956-04-21 DOA: 10/14/2019 PCP: Patient, No Pcp Per   Brief Narrative: 64 year old with no documented past medical history presented to the ED for evaluation of fever and lethargy.  Patient was found to be febrile temperature 103, tachycardic.  Daughter told EMS that patient has a history of ascites.  She reports 4 days of fever, chills, cough shortness of breath.  Reports a longstanding history of daily alcohol use more than 30 years.  Patient admitted with sepsis, lactic acid 4.4, leukocytosis, compensator cirrhosis Monia bacteremia.   Assessment & Plan:   Principal Problem:   CAP (community acquired pneumonia) Active Problems:   Sepsis (HCC)   Liver disease   Anemia   Thrombocytopenia (HCC)  1-acute hypoxic respiratory failure secondary to pneumonia: Repeat a chest x-ray this morning showed some pulmonary edema as well. Continue with IV antibiotics ceftriaxone and azithromycin. Stop IV fluids. Continue with oxygen supplementation currently at 4 L.  2-sepsis secondary to pneumonia found to have E. coli bacteremia. Continue with IV ceftriaxone.  3-Liver disease coagulopathy, increased bilirubin. Likely has cirrhosis Ultrasound without significant ascites findings consistent with cirrhosis Ammonia level elevated start lactulose. Vitamin K.  4-Alcohol  use disorder: Continue to monitor on CIWA protocol 5-hypomagnesemia: Replete magnesium 6-hyponatremia: We will see if IV fluids 7-hypokalemia: Continue with potassium supplementation 8-anemia: Folic acid deficiency: Started on supplementation  Estimated body mass index is 31.32 kg/m as calculated from the following:   Height as of this encounter: 5\' 7"  (1.702 m).   Weight as of this encounter: 90.7 kg.   DVT prophylaxis: SCDs Code Status: Full code Family Communication: Care discussed with patient Disposition Plan:  Status is: Inpatient  Remains inpatient appropriate  because:Hemodynamically unstable   Dispo: The patient is from: Home              Anticipated d/c is to: Home              Anticipated d/c date is: 3 days              Patient currently is not medically stable to d/c.        Consultants:   None  Procedures:  Right upper quadrant ultrasound:Layering sludge/stones. No definite evidence of acute cholecystitis.  Findings suggestive of cirrhosis  Small amount of perihepatic ascites.    Antimicrobials:  Ceftriaxone and azithromycin  Subjective: Patient is a sleepy, wake up answer questions.  She report feeling a little bit better today.  Report mild cough.  Denies worsening shortness of breath  Objective: Vitals:   10/15/19 0230 10/15/19 0250 10/15/19 0414 10/15/19 0750  BP: 92/61 102/62 114/62 120/60  Pulse: 99 (!) 107 (!) 102 (!) 116  Resp: (!) 24  18 18   Temp:   98.9 F (37.2 C) 98.6 F (37 C)  TempSrc:   Oral   SpO2: 93%  90% 91%  Weight:      Height:        Intake/Output Summary (Last 24 hours) at 10/15/2019 1527 Last data filed at 10/15/2019 1517 Gross per 24 hour  Intake 3056.35 ml  Output --  Net 3056.35 ml   Filed Weights   10/15/19 0038  Weight: 90.7 kg    Examination:  General exam: Appears calm and comfortable  Respiratory system: C bilateral rhonchus. Respiratory effort normal. Cardiovascular system: S1 & S2 heard, RRR. No JVD, murmurs, rubs, gallops or clicks. No pedal edema. Gastrointestinal system: Abdomen is nondistended, soft and nontender.  No organomegaly or masses felt. Normal bowel sounds heard. Central nervous system: Alert and oriented. No focal neurological deficits. Extremities: Symmetric 5 x 5 power.   Data Reviewed: I have personally reviewed following labs and imaging studies  CBC: Recent Labs  Lab 10/14/19 2357 10/15/19 0520  WBC 17.8* 16.5*  NEUTROABS 14.0*  --   HGB 11.1* 10.8*  HCT 32.4* 31.5*  MCV 108.7* 108.2*  PLT 123* 112*   Basic Metabolic  Panel: Recent Labs  Lab 10/14/19 2357 10/15/19 0520  NA 128* 130*  K 3.2* 3.0*  CL 93* 98  CO2 20* 23  GLUCOSE 106* 111*  BUN 27* 23  CREATININE 1.29* 1.09*  CALCIUM 8.0* 7.6*  MG  --  1.4*   GFR: Estimated Creatinine Clearance: 60.3 mL/min (A) (by C-G formula based on SCr of 1.09 mg/dL (H)). Liver Function Tests: Recent Labs  Lab 10/14/19 2357  AST 162*  ALT 41  ALKPHOS 50  BILITOT 4.8*  PROT 6.8  ALBUMIN 2.2*   Recent Labs  Lab 10/14/19 2357  LIPASE 50   Recent Labs  Lab 10/14/19 2357  AMMONIA 76*   Coagulation Profile: Recent Labs  Lab 10/14/19 2357 10/15/19 0520  INR 2.3* 2.4*   Cardiac Enzymes: No results for input(s): CKTOTAL, CKMB, CKMBINDEX, TROPONINI in the last 168 hours. BNP (last 3 results) No results for input(s): PROBNP in the last 8760 hours. HbA1C: No results for input(s): HGBA1C in the last 72 hours. CBG: Recent Labs  Lab 10/15/19 0442  GLUCAP 129*   Lipid Profile: No results for input(s): CHOL, HDL, LDLCALC, TRIG, CHOLHDL, LDLDIRECT in the last 72 hours. Thyroid Function Tests: No results for input(s): TSH, T4TOTAL, FREET4, T3FREE, THYROIDAB in the last 72 hours. Anemia Panel: Recent Labs    10/15/19 0520  VITAMINB12 500  FOLATE 5.9*   Sepsis Labs: Recent Labs  Lab 10/14/19 2357 10/15/19 0153 10/15/19 0520  PROCALCITON 3.31  --   --   LATICACIDVEN 4.4* 3.2* 2.4*    Recent Results (from the past 240 hour(s))  Blood Culture (routine x 2)     Status: None (Preliminary result)   Collection Time: 10/14/19 11:57 PM   Specimen: BLOOD RIGHT FOREARM  Result Value Ref Range Status   Specimen Description BLOOD RIGHT FOREARM  Final   Special Requests   Final    BOTTLES DRAWN AEROBIC AND ANAEROBIC Blood Culture adequate volume   Culture  Setup Time   Final    GRAM NEGATIVE RODS AEROBIC BOTTLE ONLY Performed at [facility], [address] [place], [place] [address]    Culture PENDING  Incomplete   Report Status  PENDING  Incomplete  SARS Coronavirus 2 by RT PCR (hospital order, performed in [facility] hospital lab) Nasopharyngeal Nasopharyngeal Swab     Status: None   Collection Time: 10/14/19 11:57 PM   Specimen: Nasopharyngeal Swab  Result Value Ref Range Status   SARS Coronavirus 2 NEGATIVE NEGATIVE Final    Comment: (NOTE) SARS-CoV-2 target nucleic acids are NOT DETECTED.  The SARS-CoV-2 RNA is generally detectable in upper and lower respiratory specimens during the acute phase of infection. The lowest concentration of SARS-CoV-2 viral copies this assay can detect is 250 copies / mL. A negative result does not preclude SARS-CoV-2 infection and should not be used as the sole basis for treatment or other patient management decisions.  A negative result may occur with improper specimen collection / handling, submission of specimen other than nasopharyngeal swab, presence of  viral mutation(s) within the areas targeted by this assay, and inadequate number of viral copies (<250 copies / mL). A negative result must be combined with clinical observations, patient history, and epidemiological information.  Fact Sheet for Patients:   StrictlyIdeas.no  Fact Sheet for Healthcare Providers: BankingDealers.co.za  This test is not yet approved or  cleared by the [place] FDA and has been authorized for detection and/or diagnosis of SARS-CoV-2 by FDA under an Emergency Use Authorization (EUA).  This EUA will remain in effect (meaning this test can be used) for the duration of the COVID-19 declaration under Section 564(b)(1) of the Act, 21 U.S.C. section 360bbb-3(b)(1), unless the authorization is terminated or revoked sooner.  Performed at [facility], [place] [address], [place], [place] [address]   Blood Culture (routine x 2)     Status: None (Preliminary result)   Collection Time: 10/15/19 12:08 AM   Specimen: BLOOD LEFT HAND  Result  Value Ref Range Status   Specimen Description BLOOD LEFT HAND  Final   Special Requests   Final    BOTTLES DRAWN AEROBIC AND ANAEROBIC Blood Culture results may not be optimal due to an inadequate volume of blood received in culture bottles   Culture  Setup Time   Final    GRAM NEGATIVE RODS ANAEROBIC BOTTLE ONLY Organism ID to follow Performed at [facility], [place] [address], [place], [place] [address]    Culture GRAM NEGATIVE RODS  Final   Report Status PENDING  Incomplete         Radiology Studies: DG CHEST PORT 1 VIEW  Result Date: 10/15/2019 CLINICAL DATA:  Fever and hypoxia EXAM: PORTABLE CHEST 1 VIEW COMPARISON:  October 15, 2019 FINDINGS: There is a left pleural effusion with atelectasis and questionable superimposed pneumonia left lower lobe, stable. There is a small right pleural effusion. There is mild interstitial edema. There is mild cardiomegaly with pulmonary venous hypertension. There is aortic atherosclerosis. IMPRESSION: Mild cardiomegaly with a degree of pulmonary vascular congestion and mild interstitial edema. Question a degree of congestive heart failure. Small left pleural effusion. Concern for pneumonia left lower lobe with consolidation left base. A degree of alveolar edema in this area is possible. Aortic Atherosclerosis (ICD10-I70.0). Electronically Signed   By: Lowella Grip III M.D.   On: 10/15/2019 10:00   DG Chest Port 1 View  Result Date: 10/15/2019 CLINICAL DATA:  Cough EXAM: PORTABLE CHEST 1 VIEW COMPARISON:  July 18, 2009 FINDINGS: There is mild cardiomegaly. Hazy patchy airspace consolidation seen at the left lung base. Mildly increased interstitial markings seen within the left upper lung. The right lung is clear. No acute osseous abnormality. IMPRESSION: Hazy patchy airspace consolidation at the left lung base, concerning for pneumonia. Electronically Signed   By: Prudencio Pair M.D.   On: 10/15/2019 00:21   US Abdomen Limited RUQ  Result Date:  10/15/2019 CLINICAL DATA:  Elevated LFTs EXAM: ULTRASOUND ABDOMEN LIMITED RIGHT UPPER QUADRANT COMPARISON:  None. FINDINGS: Gallbladder: Layering sludge/small stones are seen. No sonographic Murphy sign noted by sonographer. Common bile duct: Diameter: 5 Liver: A nodular liver contour is seen with a heterogeneous echotexture. Portal vein is patent on color Doppler imaging with normal direction of blood flow towards the liver. Other: A small amount of perihepatic ascites is seen. IMPRESSION: Layering sludge/stones. No definite evidence of acute cholecystitis. Findings suggestive of cirrhosis Small amount of perihepatic ascites. Electronically Signed   By: Prudencio Pair M.D.   On: 10/15/2019 03:28  Scheduled Meds:  folic acid  1 mg Oral Daily   lactulose  30 g Oral BID   multivitamin with minerals  1 tablet Oral Daily   potassium chloride  40 mEq Oral BID   thiamine  100 mg Oral Daily   Or   thiamine  100 mg Intravenous Daily   Continuous Infusions:  azithromycin 500 mg (10/15/19 0547)   [START ON 10/16/2019] cefTRIAXone (ROCEPHIN)  IV       LOS: 0 days    Time spent: 35 minutes    Katlin Bortner A Arieal Cuoco, MD [facility]   If 7PM-7AM, please contact night-coverage [email or web address]  10/15/2019, 3:27 PM

## 2019-10-15 NOTE — Progress Notes (Signed)
PHARMACY - PHYSICIAN COMMUNICATION CRITICAL VALUE ALERT - BLOOD CULTURE IDENTIFICATION (BCID)  Shirley Mays is an 64 y.o. female who presented to [facility] on 10/14/2019 with a chief complaint of fever and fatigue  Assessment:  Patient presented to the ED with fever and weakness thought to be from community acquired pneumonia.  Ceftriaxone and azithromycin started.  2 sets of blood cultures found to be positive with Ecoli.  Name of physician (or Provider) Contacted: Dr. Sunnie Nielsen paged  Current antibiotics: Ceftriaxone + azithromycin  Changes to prescribed antibiotics recommended: Will increase ceftriaxone to 2g IV daily.  Consider stopping azithromycin if appropriate.   Results for orders placed or performed during the hospital encounter of 10/14/19  Blood Culture ID Panel (Reflexed) (Collected: 10/15/2019 12:08 AM)  Result Value Ref Range   Enterococcus species NOT DETECTED NOT DETECTED   Listeria monocytogenes NOT DETECTED NOT DETECTED   Staphylococcus species NOT DETECTED NOT DETECTED   Staphylococcus aureus (BCID) NOT DETECTED NOT DETECTED   Streptococcus species NOT DETECTED NOT DETECTED   Streptococcus agalactiae NOT DETECTED NOT DETECTED   Streptococcus pneumoniae NOT DETECTED NOT DETECTED   Streptococcus pyogenes NOT DETECTED NOT DETECTED   Acinetobacter baumannii NOT DETECTED NOT DETECTED   Enterobacteriaceae species DETECTED (A) NOT DETECTED   Enterobacter cloacae complex NOT DETECTED NOT DETECTED   Escherichia coli DETECTED (A) NOT DETECTED   Klebsiella oxytoca NOT DETECTED NOT DETECTED   Klebsiella pneumoniae NOT DETECTED NOT DETECTED   Proteus species NOT DETECTED NOT DETECTED   Serratia marcescens NOT DETECTED NOT DETECTED   Carbapenem resistance NOT DETECTED NOT DETECTED   Haemophilus influenzae NOT DETECTED NOT DETECTED   Neisseria meningitidis NOT DETECTED NOT DETECTED   Pseudomonas aeruginosa NOT DETECTED NOT DETECTED   Candida albicans NOT DETECTED NOT DETECTED    Candida glabrata NOT DETECTED NOT DETECTED   Candida krusei NOT DETECTED NOT DETECTED   Candida parapsilosis NOT DETECTED NOT DETECTED   Candida tropicalis NOT DETECTED NOT DETECTED    Mickeal Skinner 10/15/2019  3:34 PM

## 2019-10-16 ENCOUNTER — Inpatient Hospital Stay (HOSPITAL_COMMUNITY): Payer: Self-pay

## 2019-10-16 HISTORY — PX: IR PARACENTESIS: IMG2679

## 2019-10-16 LAB — COMPREHENSIVE METABOLIC PANEL
ALT: 48 U/L — ABNORMAL HIGH (ref 0–44)
AST: 203 U/L — ABNORMAL HIGH (ref 15–41)
Albumin: 1.9 g/dL — ABNORMAL LOW (ref 3.5–5.0)
Alkaline Phosphatase: 58 U/L (ref 38–126)
Anion gap: 6 (ref 5–15)
BUN: 20 mg/dL (ref 8–23)
CO2: 24 mmol/L (ref 22–32)
Calcium: 7.5 mg/dL — ABNORMAL LOW (ref 8.9–10.3)
Chloride: 98 mmol/L (ref 98–111)
Creatinine, Ser: 0.85 mg/dL (ref 0.44–1.00)
GFR calc Af Amer: >60 mL/min (ref >60)
GFR calc non Af Amer: >60 mL/min (ref >60)
Glucose, Bld: 111 mg/dL — ABNORMAL HIGH (ref 70–99)
Potassium: 5.1 mmol/L (ref 3.5–5.1)
Sodium: 128 mmol/L — ABNORMAL LOW (ref 135–145)
Total Bilirubin: 5.5 mg/dL — ABNORMAL HIGH (ref 0.3–1.2)
Total Protein: 5.9 g/dL — ABNORMAL LOW (ref 6.5–8.1)

## 2019-10-16 LAB — CBC
HCT: 30.3 % — ABNORMAL LOW (ref 36.0–46.0)
Hemoglobin: 10.7 g/dL — ABNORMAL LOW (ref 12.0–15.0)
MCH: 37.3 pg — ABNORMAL HIGH (ref 26.0–34.0)
MCHC: 35.3 g/dL (ref 30.0–36.0)
MCV: 105.6 fL — ABNORMAL HIGH (ref 80.0–100.0)
Platelets: 117 10*3/uL — ABNORMAL LOW (ref 150–400)
RBC: 2.87 MIL/uL — ABNORMAL LOW (ref 3.87–5.11)
RDW: 16.2 % — ABNORMAL HIGH (ref 11.5–15.5)
WBC: 18.2 10*3/uL — ABNORMAL HIGH (ref 4.0–10.5)
nRBC: 0.1 % (ref 0.0–0.2)

## 2019-10-16 LAB — PROTEIN, PLEURAL OR PERITONEAL FLUID: Total protein, fluid: <3 g/dL

## 2019-10-16 LAB — BODY FLUID CELL COUNT WITH DIFFERENTIAL
Eos, Fluid: 0 %
Lymphs, Fluid: 8 %
Monocyte-Macrophage-Serous Fluid: 59 % (ref 50–90)
Neutrophil Count, Fluid: 33 % — ABNORMAL HIGH (ref 0–25)
Total Nucleated Cell Count, Fluid: 590 cu mm (ref 0–1000)

## 2019-10-16 LAB — GRAM STAIN

## 2019-10-16 LAB — PROTIME-INR
INR: 2.1 — ABNORMAL HIGH (ref 0.8–1.2)
Prothrombin Time: 22.4 seconds — ABNORMAL HIGH (ref 11.4–15.2)

## 2019-10-16 LAB — ALBUMIN, PLEURAL OR PERITONEAL FLUID: Albumin, Fluid: <1 g/dL

## 2019-10-16 LAB — AMMONIA: Ammonia: 22 umol/L (ref 9–35)

## 2019-10-16 MED ORDER — FUROSEMIDE 10 MG/ML IJ SOLN
20.0000 mg | Freq: Once | INTRAMUSCULAR | Status: AC
  Administered 2019-10-16: 20 mg via INTRAVENOUS
  Filled 2019-10-16: qty 2

## 2019-10-16 MED ORDER — PHYTONADIONE 5 MG PO TABS
10.0000 mg | ORAL_TABLET | Freq: Once | ORAL | Status: AC
  Administered 2019-10-16: 10 mg via ORAL
  Filled 2019-10-16: qty 2

## 2019-10-16 MED ORDER — LIDOCAINE HCL 1 % IJ SOLN
INTRAMUSCULAR | Status: DC | PRN
  Administered 2019-10-16: 10 mL

## 2019-10-16 MED ORDER — LIDOCAINE HCL 1 % IJ SOLN
INTRAMUSCULAR | Status: AC
  Filled 2019-10-16: qty 20

## 2019-10-16 MED ORDER — LACTULOSE 10 GM/15ML PO SOLN
30.0000 g | Freq: Every day | ORAL | Status: DC
  Administered 2019-10-16: 30 g via ORAL

## 2019-10-16 MED ORDER — FUROSEMIDE 20 MG PO TABS
20.0000 mg | ORAL_TABLET | Freq: Every day | ORAL | Status: DC
  Administered 2019-10-17: 20 mg via ORAL
  Filled 2019-10-16: qty 1

## 2019-10-16 MED ORDER — LACTULOSE 10 GM/15ML PO SOLN
30.0000 g | Freq: Every day | ORAL | Status: DC
  Administered 2019-10-16 – 2019-10-18 (×3): 30 g via ORAL
  Filled 2019-10-16 (×3): qty 45

## 2019-10-16 MED ORDER — SPIRONOLACTONE 25 MG PO TABS
50.0000 mg | ORAL_TABLET | Freq: Every day | ORAL | Status: DC
  Administered 2019-10-16 – 2019-10-17 (×2): 50 mg via ORAL
  Filled 2019-10-16 (×2): qty 2

## 2019-10-16 NOTE — Progress Notes (Signed)
PROGRESS NOTE    Shirley Mays  WFU:[id number] DOB: 11-Aug-1955 DOA: 10/14/2019 PCP: Patient, No Pcp Per   Brief Narrative: 64 year old with no documented past medical history presented to the ED for evaluation of fever and lethargy.  Patient was found to be febrile temperature 103, tachycardic.  Daughter told EMS that patient has a history of ascites.  She reports 4 days of fever, chills, cough shortness of breath.  Reports a longstanding history of daily alcohol use more than 30 years.  Patient admitted with sepsis, lactic acid 4.4, leukocytosis, compensator cirrhosis Monia bacteremia.   Assessment & Plan:   Principal Problem:   CAP (community acquired pneumonia) Active Problems:   Sepsis (HCC)   Liver disease   Anemia   Thrombocytopenia (HCC)  1-acute hypoxic respiratory failure secondary to pneumonia: Repeat a chest x-ray this morning showed some pulmonary edema as well. Continue with IV antibiotics ceftriaxone and azithromycin. Stop IV fluids. Continue with oxygen supplementation currently at 4 L. Will give IV lasix.   2-sepsis secondary to pneumonia found to have E. coli bacteremia. Continue with IV ceftriaxone. WBC still elevated.   3-Liver disease coagulopathy, increased bilirubin. Decompensated Liver failure Likely has cirrhosis Ultrasound without significant ascites findings consistent with cirrhosis Ammonia level elevated start lactulose. Vitamin K. GI consulted. Underwent paracentesis 6/17 yielding 1.2 L. Follow fluid results.   4-Alcohol  use disorder: Continue to monitor on CIWA protocol. counseling provided.  5-Hypomagnesemia: Replete magnesium 6-hyponatremia: related to cirrhosis. Monitor on lasix.  7-hypokalemia: replaced.  8-anemia: Folic acid deficiency: Started on supplementation  Estimated body mass index is 31.32 kg/m as calculated from the following:   Height as of this encounter: 5\' 7"  (1.702 m).   Weight as of this encounter: 90.7 kg.   DVT  prophylaxis: SCDs Code Status: Full code Family Communication: Care discussed with patient Disposition Plan:  Status is: Inpatient  Remains inpatient appropriate because:Hemodynamically unstable   Dispo: The patient is from: Home              Anticipated d/c is to: Home              Anticipated d/c date is: 3 days              Patient currently is not medically stable to d/c.        Consultants:   None  Procedures:  Right upper quadrant ultrasound:Layering sludge/stones. No definite evidence of acute cholecystitis.  Findings suggestive of cirrhosis  Small amount of perihepatic ascites.    Antimicrobials:  Ceftriaxone and azithromycin  Subjective: Dyspnea improved.  deneis abdominal pain.  Feels less confuse  Objective: Vitals:   10/15/19 0750 10/15/19 1953 10/15/19 2007 10/16/19 0733  BP: 120/60 (!) 95/53 (!) 95/53 101/61  Pulse: (!) 116 84 84 98  Resp: 18   18  Temp: 98.6 F (37 C) (!) 97.2 F (36.2 C) (!) 97.2 F (36.2 C) 98.5 F (36.9 C)  TempSrc:   Oral   SpO2: 91% 100% 100% 99%  Weight:      Height:        Intake/Output Summary (Last 24 hours) at 10/16/2019 1617 Last data filed at 10/16/2019 0400 Gross per 24 hour  Intake 100.64 ml  Output --  Net 100.64 ml   Filed Weights   10/15/19 0038  Weight: 90.7 kg    Examination:  General exam; NAD Respiratory system; Bilateral crackles.  Cardiovascular system: S 1, S 2 RRR Gastrointestinal system: BS present, soft, nt Central nervous system:  alert Extremities: Symmetric power.   Data Reviewed: I have personally reviewed following labs and imaging studies  CBC: Recent Labs  Lab 10/14/19 2357 10/15/19 0520 10/16/19 0500  WBC 17.8* 16.5* 18.2*  NEUTROABS 14.0*  --   --   HGB 11.1* 10.8* 10.7*  HCT 32.4* 31.5* 30.3*  MCV 108.7* 108.2* 105.6*  PLT 123* 112* 117*   Basic Metabolic Panel: Recent Labs  Lab 10/14/19 2357 10/15/19 0520 10/16/19 0500  NA 128* 130* 128*  K 3.2*  3.0* 5.1  CL 93* 98 98  CO2 20* 23 24  GLUCOSE 106* 111* 111*  BUN 27* 23 20  CREATININE 1.29* 1.09* 0.85  CALCIUM 8.0* 7.6* 7.5*  MG  --  1.4*  --    GFR: Estimated Creatinine Clearance: 77.3 mL/min (by C-G formula based on SCr of 0.85 mg/dL). Liver Function Tests: Recent Labs  Lab 10/14/19 2357 10/16/19 0500  AST 162* 203*  ALT 41 48*  ALKPHOS 50 58  BILITOT 4.8* 5.5*  PROT 6.8 5.9*  ALBUMIN 2.2* 1.9*   Recent Labs  Lab 10/14/19 2357  LIPASE 50   Recent Labs  Lab 10/14/19 2357 10/16/19 0617  AMMONIA 76* 22   Coagulation Profile: Recent Labs  Lab 10/14/19 2357 10/15/19 0520 10/16/19 0500  INR 2.3* 2.4* 2.1*   Cardiac Enzymes: No results for input(s): CKTOTAL, CKMB, CKMBINDEX, TROPONINI in the last 168 hours. BNP (last 3 results) No results for input(s): PROBNP in the last 8760 hours. HbA1C: No results for input(s): HGBA1C in the last 72 hours. CBG: Recent Labs  Lab 10/15/19 0442  GLUCAP 129*   Lipid Profile: No results for input(s): CHOL, HDL, LDLCALC, TRIG, CHOLHDL, LDLDIRECT in the last 72 hours. Thyroid Function Tests: No results for input(s): TSH, T4TOTAL, FREET4, T3FREE, THYROIDAB in the last 72 hours. Anemia Panel: Recent Labs    10/15/19 0520  VITAMINB12 500  FOLATE 5.9*   Sepsis Labs: Recent Labs  Lab 10/14/19 2357 10/15/19 0153 10/15/19 0520  PROCALCITON 3.31  --   --   LATICACIDVEN 4.4* 3.2* 2.4*    Recent Results (from the past 240 hour(s))  Blood Culture (routine x 2)     Status: None (Preliminary result)   Collection Time: 10/14/19 11:57 PM   Specimen: BLOOD RIGHT FOREARM  Result Value Ref Range Status   Specimen Description BLOOD RIGHT FOREARM  Final   Special Requests   Final    BOTTLES DRAWN AEROBIC AND ANAEROBIC Blood Culture adequate volume   Culture  Setup Time   Final    GRAM NEGATIVE RODS IN BOTH AEROBIC AND ANAEROBIC BOTTLES CRITICAL RESULT CALLED TO, READ BACK BY AND VERIFIED WITHPeter Minium PHARMD [id number]  10/15/19 A BROWNING Performed at [facility], [address], [place], [place] [address]    Culture GRAM NEGATIVE RODS  Final   Report Status PENDING  Incomplete  SARS Coronavirus 2 by RT PCR (hospital order, performed in [facility] hospital lab) Nasopharyngeal Nasopharyngeal Swab     Status: None   Collection Time: 10/14/19 11:57 PM   Specimen: Nasopharyngeal Swab  Result Value Ref Range Status   SARS Coronavirus 2 NEGATIVE NEGATIVE Final    Comment: (NOTE) SARS-CoV-2 target nucleic acids are NOT DETECTED.  The SARS-CoV-2 RNA is generally detectable in upper and lower respiratory specimens during the acute phase of infection. The lowest concentration of SARS-CoV-2 viral copies this assay can detect is 250 copies / mL. A negative result does not preclude SARS-CoV-2 infection and  should not be used as the sole basis for treatment or other patient management decisions.  A negative result may occur with improper specimen collection / handling, submission of specimen other than nasopharyngeal swab, presence of viral mutation(s) within the areas targeted by this assay, and inadequate number of viral copies (<250 copies / mL). A negative result must be combined with clinical observations, patient history, and epidemiological information.  Fact Sheet for Patients:   BoilerBrush.com.cyhttps://www.fda.gov/media/136312/download  Fact Sheet for Healthcare Providers: https://pope.com/https://www.fda.gov/media/136313/download  This test is not yet approved or  cleared by the [place] FDA and has been authorized for detection and/or diagnosis of SARS-CoV-2 by FDA under an Emergency Use Authorization (EUA).  This EUA will remain in effect (meaning this test can be used) for the duration of the COVID-19 declaration under Section 564(b)(1) of the Act, 21 U.S.C. section 360bbb-3(b)(1), unless the authorization is terminated or revoked sooner.  Performed at [facility], [address] [place],  [place] [address]   Blood Culture (routine x 2)     Status: Abnormal (Preliminary result)   Collection Time: 10/15/19 12:08 AM   Specimen: BLOOD LEFT HAND  Result Value Ref Range Status   Specimen Description BLOOD LEFT HAND  Final   Special Requests   Final    BOTTLES DRAWN AEROBIC AND ANAEROBIC Blood Culture results may not be optimal due to an inadequate volume of blood received in culture bottles   Culture  Setup Time   Final    GRAM NEGATIVE RODS ANAEROBIC BOTTLE ONLY Organism ID to follow CRITICAL RESULT CALLED TO, READ BACK BY AND VERIFIED WITHPeter Minium: J FRENS PHARMD [id number] 10/15/19 A BROWNING Performed at [facility], [address] [place], [place] [address]    Culture ESCHERICHIA COLI (A)  Final   Report Status PENDING  Incomplete  Blood Culture ID Panel (Reflexed)     Status: Abnormal   Collection Time: 10/15/19 12:08 AM  Result Value Ref Range Status   Enterococcus species NOT DETECTED NOT DETECTED Final   Listeria monocytogenes NOT DETECTED NOT DETECTED Final   Staphylococcus species NOT DETECTED NOT DETECTED Final   Staphylococcus aureus (BCID) NOT DETECTED NOT DETECTED Final   Streptococcus species NOT DETECTED NOT DETECTED Final   Streptococcus agalactiae NOT DETECTED NOT DETECTED Final   Streptococcus pneumoniae NOT DETECTED NOT DETECTED Final   Streptococcus pyogenes NOT DETECTED NOT DETECTED Final   Acinetobacter baumannii NOT DETECTED NOT DETECTED Final   Enterobacteriaceae species DETECTED (A) NOT DETECTED Final    Comment: Enterobacteriaceae represent a large family of gram-negative bacteria, not a single organism. CRITICAL RESULT CALLED TO, READ BACK BY AND VERIFIED WITH: Peter MiniumJ FRENS PHARMD 1527 10/15/19 A BROWNING    Enterobacter cloacae complex NOT DETECTED NOT DETECTED Final   Escherichia coli DETECTED (A) NOT DETECTED Final    Comment: CRITICAL RESULT CALLED TO, READ BACK BY AND VERIFIED WITH: Peter MiniumJ FRENS PHARMD 1527 10/15/19 A BROWNING    Klebsiella oxytoca NOT  DETECTED NOT DETECTED Final   Klebsiella pneumoniae NOT DETECTED NOT DETECTED Final   Proteus species NOT DETECTED NOT DETECTED Final   Serratia marcescens NOT DETECTED NOT DETECTED Final   Carbapenem resistance NOT DETECTED NOT DETECTED Final   Haemophilus influenzae NOT DETECTED NOT DETECTED Final   Neisseria meningitidis NOT DETECTED NOT DETECTED Final   Pseudomonas aeruginosa NOT DETECTED NOT DETECTED Final   Candida albicans NOT DETECTED NOT DETECTED Final   Candida glabrata NOT DETECTED NOT DETECTED Final   Candida krusei  NOT DETECTED NOT DETECTED Final   Candida parapsilosis NOT DETECTED NOT DETECTED Final   Candida tropicalis NOT DETECTED NOT DETECTED Final    Comment: Performed at [facility], [address], [place], [place] [address]         Radiology Studies: DG CHEST PORT 1 VIEW  Result Date: 10/15/2019 CLINICAL DATA:  Fever and hypoxia EXAM: PORTABLE CHEST 1 VIEW COMPARISON:  October 15, 2019 FINDINGS: There is a left pleural effusion with atelectasis and questionable superimposed pneumonia left lower lobe, stable. There is a small right pleural effusion. There is mild interstitial edema. There is mild cardiomegaly with pulmonary venous hypertension. There is aortic atherosclerosis. IMPRESSION: Mild cardiomegaly with a degree of pulmonary vascular congestion and mild interstitial edema. Question a degree of congestive heart failure. Small left pleural effusion. Concern for pneumonia left lower lobe with consolidation left base. A degree of alveolar edema in this area is possible. Aortic Atherosclerosis (ICD10-I70.0). Electronically Signed   By: Lowella Grip III M.D.   On: 10/15/2019 10:00   DG Chest Port 1 View  Result Date: 10/15/2019 CLINICAL DATA:  Cough EXAM: PORTABLE CHEST 1 VIEW COMPARISON:  July 18, 2009 FINDINGS: There is mild cardiomegaly. Hazy patchy airspace consolidation seen at the left lung base. Mildly increased interstitial markings seen within the  left upper lung. The right lung is clear. No acute osseous abnormality. IMPRESSION: Hazy patchy airspace consolidation at the left lung base, concerning for pneumonia. Electronically Signed   By: Prudencio Pair M.D.   On: 10/15/2019 00:21   US Abdomen Limited RUQ  Result Date: 10/15/2019 CLINICAL DATA:  Elevated LFTs EXAM: ULTRASOUND ABDOMEN LIMITED RIGHT UPPER QUADRANT COMPARISON:  None. FINDINGS: Gallbladder: Layering sludge/small stones are seen. No sonographic Murphy sign noted by sonographer. Common bile duct: Diameter: 5 Liver: A nodular liver contour is seen with a heterogeneous echotexture. Portal vein is patent on color Doppler imaging with normal direction of blood flow towards the liver. Other: A small amount of perihepatic ascites is seen. IMPRESSION: Layering sludge/stones. No definite evidence of acute cholecystitis. Findings suggestive of cirrhosis Small amount of perihepatic ascites. Electronically Signed   By: Prudencio Pair M.D.   On: 10/15/2019 03:28   IR Paracentesis  Result Date: 10/16/2019 INDICATION: Ascites secondary to alcoholic cirrhosis. Request for diagnostic and therapeutic paracentesis. EXAM: ULTRASOUND GUIDED PARACENTESIS MEDICATIONS: 1% lidocaine 15 mL COMPLICATIONS: None immediate. PROCEDURE: Informed written consent was obtained from the patient after a discussion of the risks, benefits and alternatives to treatment. A timeout was performed prior to the initiation of the procedure. Initial ultrasound scanning demonstrates a moderate amount of ascites within the right lower abdominal quadrant. The right lower abdomen was prepped and draped in the usual sterile fashion. 1% lidocaine was used for local anesthesia. Following this, a 19 gauge, 7-cm, Yueh catheter was introduced. An ultrasound image was saved for documentation purposes. The paracentesis was performed. The catheter was removed and a dressing was applied. The patient tolerated the procedure well without immediate post  procedural complication. FINDINGS: A total of approximately 1.2 L of clear yellow fluid was removed. Samples were sent to the laboratory as requested by the clinical team. IMPRESSION: Successful ultrasound-guided paracentesis yielding 1.2 liters of peritoneal fluid. Read by: Gareth Eagle, PA-C Electronically Signed   By: Lucrezia Europe M.D.   On: 10/16/2019 14:43        Scheduled Meds:  folic acid  1 mg Oral Daily   [START ON 10/17/2019] furosemide  20 mg  Oral Daily   [START ON 10/17/2019] lactulose  30 g Oral Daily   lidocaine       multivitamin with minerals  1 tablet Oral Daily   spironolactone  50 mg Oral Daily   thiamine  100 mg Oral Daily   Or   thiamine  100 mg Intravenous Daily   Continuous Infusions:  azithromycin 500 mg (10/16/19 0651)   cefTRIAXone (ROCEPHIN)  IV 2 g (10/15/19 1724)     LOS: 1 day    Time spent: 35 minutes    Keasha Malkiewicz A Deaunte Dente, MD [facility]   If 7PM-7AM, please contact night-coverage [email or web address]  10/16/2019, 4:17 PM

## 2019-10-16 NOTE — Procedures (Signed)
PROCEDURE SUMMARY:  Successful US guided paracentesis from right lateral abdomen.  Yielded 1.2 liters of clear yellow fluid.  No immediate complications.  Patient tolerated well.  EBL = trace  Specimen was sent for labs.  Toniann Fail S Mairely Foxworth PA-C 10/16/2019 2:44 PM

## 2019-10-16 NOTE — Consult Note (Addendum)
Referring Provider: Dr. Niel Hummer Primary Care Physician:  Patient, No Pcp Per Primary Gastroenterologist: Althia Forts  Reason for Consultation: Cirrhosis, ascites  HPI: Shirley Mays is a 64 y.o. female with history of chronic alcohol use and tubal ligation presenting for consultation of cirrhosis and ascites.  Patient presented to the ED on 6/15 with fever (103F), tachycardia (HR in the 150s), nonproductive cough, and lethargy.  Findings were consistent with sepsis, thought to be related to community-acquired pneumonia.  Patient was also reportedly confused.  Today, patient states she is feeling bit better, though she feels cold.  She endorses diffuse abdominal pain and constipation.  She states she has been constipated for several days.  She denies a history of constipation in the past.  She denies any melena or hematochezia.  Patient reports abdominal distention for at least 1 month, though thinks this has slowly improved.  She is also reported worsening bilateral lower extremity edema (L>R) for the past 3 weeks.  She reports having heartburn once last week for which she took Alka-Seltzer which caused her to feel nauseated and have 1 episode of emesis.  Denies any coffee-ground emesis or hematemesis.  Denies any heartburn, nausea, or vomiting since that time.  Denies any dysphagia, changes in appetite, unexplained weight loss.  Patient denies any history of known liver disease.  Denies prior blood transfusions.  Denies IV drug use.  Patient reports chronic alcohol use.  Recently, she states she has not had as much alcohol to drink because she has felt ill after drinking.  However, up until a few days ago, she drank at least 3 shots per day, sometimes more.  Denies any aspirin, NSAID, or blood thinner use.  She has never had an EGD or colonoscopy.  Patient oriented to person, place, time today and able to provide history, report location, and report date.  Ultrasound 10/15/2019: A nodular  liver contour is seen with a heterogeneous echotexture, suggestive of cirrhosis.  Small amount of perihepatic ascites. Gallbladder with layering sludge/stones. No definite evidence of acute cholecystitis.  Normal CBD diameter (69mm).   History reviewed. No pertinent past medical history.  Past Surgical History:  Procedure Laterality Date  . TUBAL LIGATION      Prior to Admission medications   Not on File    Scheduled Meds: . folic acid  1 mg Oral Daily  . [START ON 10/17/2019] lactulose  30 g Oral Daily  . multivitamin with minerals  1 tablet Oral Daily  . thiamine  100 mg Oral Daily   Or  . thiamine  100 mg Intravenous Daily   Continuous Infusions: . azithromycin 500 mg (10/16/19 0651)  . cefTRIAXone (ROCEPHIN)  IV 2 g (10/15/19 1724)   PRN Meds:.acetaminophen **OR** acetaminophen, guaiFENesin-dextromethorphan, LORazepam **OR** LORazepam, sodium chloride flush  Allergies as of 10/14/2019  . (No Known Allergies)    History reviewed. No pertinent family history.  Social History   Socioeconomic History  . Marital status: Single    Spouse name: Not on file  . Number of children: Not on file  . Years of education: Not on file  . Highest education level: Not on file  Occupational History  . Not on file  Tobacco Use  . Smoking status: Current Every Day Smoker    Types: Cigarettes  . Smokeless tobacco: Never Used  Substance and Sexual Activity  . Alcohol use: Yes    Comment: Daily  . Drug use: Never  . Sexual activity: Not on file  Other Topics Concern  .  Not on file  Social History Narrative  . Not on file   Social Determinants of Health   Financial Resource Strain:   . Difficulty of Paying Living Expenses:   Food Insecurity:   . Worried About Programme researcher, broadcasting/film/video in the Last Year:   . Barista in the Last Year:   Transportation Needs:   . Freight forwarder (Medical):   Marland Kitchen Lack of Transportation (Non-Medical):   Physical Activity:   . Days of  Exercise per Week:   . Minutes of Exercise per Session:   Stress:   . Feeling of Stress :   Social Connections:   . Frequency of Communication with Friends and Family:   . Frequency of Social Gatherings with Friends and Family:   . Attends Religious Services:   . Active Member of Clubs or Organizations:   . Attends Banker Meetings:   Marland Kitchen Marital Status:   Intimate Partner Violence:   . Fear of Current or Ex-Partner:   . Emotionally Abused:   Marland Kitchen Physically Abused:   . Sexually Abused:     Review of Systems: Review of Systems  Constitutional: Positive for chills, fever and malaise/fatigue. Negative for weight loss.  HENT: Negative for hearing loss and tinnitus.   Eyes: Negative for pain and redness.  Respiratory: Positive for cough. Negative for shortness of breath.   Cardiovascular: Negative for chest pain and palpitations.  Gastrointestinal: Positive for abdominal pain and constipation. Negative for blood in stool, diarrhea, heartburn, melena, nausea and vomiting.  Genitourinary: Negative for flank pain and hematuria.  Musculoskeletal: Negative for falls and joint pain.  Skin: Negative for itching and rash.  Neurological: Negative for seizures and loss of consciousness.  Endo/Heme/Allergies: Negative for polydipsia. Does not bruise/bleed easily.  Psychiatric/Behavioral: Positive for substance abuse (alcohol). The patient is not nervous/anxious.     Physical Exam: Vital signs: Vitals:   10/15/19 2007 10/16/19 0733  BP: (!) 95/53 101/61  Pulse: 84 98  Resp:  18  Temp: (!) 97.2 F (36.2 C) 98.5 F (36.9 C)  SpO2: 100% 99%   Last BM Date: 10/15/19 Physical Exam Constitutional:      General: She is not in acute distress.    Appearance: Normal appearance. She is well-developed.  HENT:     Head: Normocephalic and atraumatic.     Nose: Nose normal.     Mouth/Throat:     Mouth: Mucous membranes are moist.     Pharynx: Oropharynx is clear.  Eyes:     General:  Scleral icterus present.     Extraocular Movements: Extraocular movements intact.  Cardiovascular:     Rate and Rhythm: Normal rate and regular rhythm.     Pulses: Normal pulses.     Heart sounds: Normal heart sounds.  Pulmonary:     Effort: Pulmonary effort is normal. No respiratory distress.     Breath sounds: Normal breath sounds.  Abdominal:     General: Bowel sounds are normal. There is distension (moderate).     Palpations: Abdomen is soft. There is no mass.     Tenderness: There is abdominal tenderness (mild, diffuse). There is no guarding or rebound.     Hernia: No hernia is present.  Musculoskeletal:        General: No deformity.     Cervical back: Normal range of motion and neck supple.     Right lower leg: Edema (1+ pedal edema) present.     Left lower leg:  Edema (2-3+ pedal edema) present.  Skin:    General: Skin is warm and dry.     Coloration: Skin is jaundiced.  Neurological:     General: No focal deficit present.     Mental Status: She is alert and oriented to person, place, and time.  Psychiatric:        Mood and Affect: Mood normal.        Behavior: Behavior normal.    GI:  Lab Results: Recent Labs    10/14/19 2357 10/15/19 0520 10/16/19 0500  WBC 17.8* 16.5* 18.2*  HGB 11.1* 10.8* 10.7*  HCT 32.4* 31.5* 30.3*  PLT 123* 112* 117*   BMET Recent Labs    10/14/19 2357 10/15/19 0520 10/16/19 0500  NA 128* 130* 128*  K 3.2* 3.0* 5.1  CL 93* 98 98  CO2 20* 23 24  GLUCOSE 106* 111* 111*  BUN 27* 23 20  CREATININE 1.29* 1.09* 0.85  CALCIUM 8.0* 7.6* 7.5*   LFT Recent Labs    10/16/19 0500  PROT 5.9*  ALBUMIN 1.9*  AST 203*  ALT 48*  ALKPHOS 58  BILITOT 5.5*   PT/INR Recent Labs    10/15/19 0520 10/16/19 0500  LABPROT 25.7* 22.4*  INR 2.4* 2.1*     Studies/Results: DG CHEST PORT 1 VIEW  Result Date: 10/15/2019 CLINICAL DATA:  Fever and hypoxia EXAM: PORTABLE CHEST 1 VIEW COMPARISON:  October 15, 2019 FINDINGS: There is a left  pleural effusion with atelectasis and questionable superimposed pneumonia left lower lobe, stable. There is a small right pleural effusion. There is mild interstitial edema. There is mild cardiomegaly with pulmonary venous hypertension. There is aortic atherosclerosis. IMPRESSION: Mild cardiomegaly with a degree of pulmonary vascular congestion and mild interstitial edema. Question a degree of congestive heart failure. Small left pleural effusion. Concern for pneumonia left lower lobe with consolidation left base. A degree of alveolar edema in this area is possible. Aortic Atherosclerosis (ICD10-I70.0). Electronically Signed   By: Bretta Bang III M.D.   On: 10/15/2019 10:00   DG Chest Port 1 View  Result Date: 10/15/2019 CLINICAL DATA:  Cough EXAM: PORTABLE CHEST 1 VIEW COMPARISON:  July 18, 2009 FINDINGS: There is mild cardiomegaly. Hazy patchy airspace consolidation seen at the left lung base. Mildly increased interstitial markings seen within the left upper lung. The right lung is clear. No acute osseous abnormality. IMPRESSION: Hazy patchy airspace consolidation at the left lung base, concerning for pneumonia. Electronically Signed   By: Jonna Clark M.D.   On: 10/15/2019 00:21   US Abdomen Limited RUQ  Result Date: 10/15/2019 CLINICAL DATA:  Elevated LFTs EXAM: ULTRASOUND ABDOMEN LIMITED RIGHT UPPER QUADRANT COMPARISON:  None. FINDINGS: Gallbladder: Layering sludge/small stones are seen. No sonographic Murphy sign noted by sonographer. Common bile duct: Diameter: 5 Liver: A nodular liver contour is seen with a heterogeneous echotexture. Portal vein is patent on color Doppler imaging with normal direction of blood flow towards the liver. Other: A small amount of perihepatic ascites is seen. IMPRESSION: Layering sludge/stones. No definite evidence of acute cholecystitis. Findings suggestive of cirrhosis Small amount of perihepatic ascites. Electronically Signed   By: Jonna Clark M.D.   On:  10/15/2019 03:28    Impression Decompensated cirrhosis with abdominal ascites; MELD-Na of 27 as of 10/16/19 -Cirrhosis per imaging, abdominal distention per physical exam.  Suspect alcoholic cirrhosis.  AST>ALT is consistent with this. -T bili 5.5/AST 203/ALT 48/ALP 58 today, increased from T bili 4.8/AST 162/ALT 41/ALP 50 on 6/15  Sepsis: suspected to be secondary to pneumonia; however, blood culture positive for enterobacteriaceae and E. Coli so we have concern for SBP  Chronic alcohol use  Plan: Recommend proceeding with IR-guided paracentesis to rule out SBP as well to as to further evaluate ascitic fluid.  If >5L fluid removed, she will need 50g albumin post-paracentesis.  Continue empiric antibiotic therapy.  Initiate spironolactone 50mg  once daily (ordered) and continue Lasix 20mg  once daily (ordered).  Start lactulose 30g once daily; titrate for 2-3 soft bowel movements per day.   Continue to monitor LFTs.  Recommend outpatient EGD (for variceal screening) and outpatient colonoscopy (patient past due for colon cancer screening). No signs of GI bleeding at this time, and patient currently has sepsis, so we do not believe inpatient work-up is indicated.  [facility] will follow.    LOS: 1 day    PA-C 10/16/2019, 12:38 PM  Contact #  [phone]

## 2019-10-17 LAB — CBC
HCT: 30.3 % — ABNORMAL LOW (ref 36.0–46.0)
Hemoglobin: 11 g/dL — ABNORMAL LOW (ref 12.0–15.0)
MCH: 37.5 pg — ABNORMAL HIGH (ref 26.0–34.0)
MCHC: 36.3 g/dL — ABNORMAL HIGH (ref 30.0–36.0)
MCV: 103.4 fL — ABNORMAL HIGH (ref 80.0–100.0)
Platelets: 142 10*3/uL — ABNORMAL LOW (ref 150–400)
RBC: 2.93 MIL/uL — ABNORMAL LOW (ref 3.87–5.11)
RDW: 16.2 % — ABNORMAL HIGH (ref 11.5–15.5)
WBC: 17.8 10*3/uL — ABNORMAL HIGH (ref 4.0–10.5)
nRBC: 0.1 % (ref 0.0–0.2)

## 2019-10-17 LAB — CULTURE, BLOOD (ROUTINE X 2): Special Requests: ADEQUATE

## 2019-10-17 LAB — COMPREHENSIVE METABOLIC PANEL
ALT: 66 U/L — ABNORMAL HIGH (ref 0–44)
AST: 229 U/L — ABNORMAL HIGH (ref 15–41)
Albumin: 2 g/dL — ABNORMAL LOW (ref 3.5–5.0)
Alkaline Phosphatase: 50 U/L (ref 38–126)
Anion gap: 10 (ref 5–15)
BUN: 14 mg/dL (ref 8–23)
CO2: 23 mmol/L (ref 22–32)
Calcium: 7.7 mg/dL — ABNORMAL LOW (ref 8.9–10.3)
Chloride: 96 mmol/L — ABNORMAL LOW (ref 98–111)
Creatinine, Ser: 0.81 mg/dL (ref 0.44–1.00)
GFR calc Af Amer: >60 mL/min (ref >60)
GFR calc non Af Amer: >60 mL/min (ref >60)
Glucose, Bld: 125 mg/dL — ABNORMAL HIGH (ref 70–99)
Potassium: 2.9 mmol/L — ABNORMAL LOW (ref 3.5–5.1)
Sodium: 129 mmol/L — ABNORMAL LOW (ref 135–145)
Total Bilirubin: 8 mg/dL — ABNORMAL HIGH (ref 0.3–1.2)
Total Protein: 6.4 g/dL — ABNORMAL LOW (ref 6.5–8.1)

## 2019-10-17 LAB — SODIUM, URINE, RANDOM: Sodium, Ur: <10 mmol/L

## 2019-10-17 LAB — CYTOLOGY - NON PAP

## 2019-10-17 LAB — HEPATITIS B SURFACE ANTIGEN: Hepatitis B Surface Ag: NONREACTIVE

## 2019-10-17 LAB — OSMOLALITY, URINE: Osmolality, Ur: 464 mOsm/kg (ref 300–900)

## 2019-10-17 LAB — HEPATITIS C ANTIBODY: HCV Ab: NONREACTIVE

## 2019-10-17 LAB — HEPATITIS B CORE ANTIBODY, TOTAL: Hep B Core Total Ab: NONREACTIVE

## 2019-10-17 MED ORDER — POTASSIUM CHLORIDE CRYS ER 20 MEQ PO TBCR
40.0000 meq | EXTENDED_RELEASE_TABLET | Freq: Once | ORAL | Status: AC
  Administered 2019-10-17: 40 meq via ORAL
  Filled 2019-10-17: qty 2

## 2019-10-17 MED ORDER — SPIRONOLACTONE 25 MG PO TABS
100.0000 mg | ORAL_TABLET | Freq: Every day | ORAL | Status: DC
  Administered 2019-10-18: 100 mg via ORAL
  Filled 2019-10-17: qty 4

## 2019-10-17 NOTE — Progress Notes (Signed)
PROGRESS NOTE    Shirley Mays  RCV:[id number] DOB: 1956-03-21 DOA: 10/14/2019 PCP: Patient, No Pcp Per   Brief Narrative: 64 year old with no documented past medical history presented to the ED for evaluation of fever and lethargy.  Patient was found to be febrile temperature 103, tachycardic.  Daughter told EMS that patient has a history of ascites.  She reports 4 days of fever, chills, cough shortness of breath.  Reports a longstanding history of daily alcohol use more than 30 years.  Patient admitted with sepsis, lactic acid 4.4, leukocytosis, compensator cirrhosis Monia bacteremia.   Assessment & Plan:   Principal Problem:   CAP (community acquired pneumonia) Active Problems:   Sepsis ([place])   Liver disease   Anemia   Thrombocytopenia ([place])  1-Acute Hypoxic respiratory failure secondary to pneumonia: Repeat a chest x-ray this morning showed some pulmonary edema as well. Continue with IV antibiotics ceftriaxone and azithromycin. Stop IV fluids. Initially requiring 4 L , taper as possible.  On oral lasix.   2-Sepsis secondary to pneumonia found to have E. coli bacteremia. Continue with IV ceftriaxone. WBC still elevated. trending down today at 17.  E coli sensitive to ceftriaxone.  Repeat Blood culture.   3-Liver disease coagulopathy, increased bilirubin. Decompensated Liver failure Likely has cirrhosis Ultrasound without significant ascites findings consistent with cirrhosis Ammonia level elevated start lactulose. Vitamin K. GI consulted. Underwent paracentesis 6/17 yielding 1.2 L. Follow fluid results. [place], she will need SBP prophylaxis at discharge.  Started on lasix, spironolactone.  Appreciate GI evaluation.  Worsening bili level. Follow GI rec  4-Alcohol  use disorder: Continue to monitor on CIWA protocol. counseling provided.  5-Hypomagnesemia: Replete magnesium 6-Hyponatremia: related to cirrhosis. Monitor on lasix.  7-Hypokalemia: replete orally.  8-Anemia:  Folic acid deficiency: Started on supplementation  Estimated body mass index is 31.32 kg/m as calculated from the following:   Height as of this encounter: 5\' 7"  (1.702 m).   Weight as of this encounter: 90.7 kg.   DVT prophylaxis: SCDs Code Status: Full code Family Communication: Care discussed with patient Disposition Plan:  Status is: Inpatient  Remains inpatient appropriate because:Hemodynamically unstable   Dispo: The patient is from: Home              Anticipated d/c is to: Home              Anticipated d/c date is: 3 days              Patient currently is not medically stable to d/c.        Consultants:   None  Procedures:  Right upper quadrant ultrasound:Layering sludge/stones. No definite evidence of acute cholecystitis.  Findings suggestive of cirrhosis  Small amount of perihepatic ascites.    Antimicrobials:  Ceftriaxone and azithromycin  Subjective: Breathing better, feeling better.  Moving her bowel   Objective: Vitals:   10/16/19 0733 10/16/19 1629 10/16/19 2134 10/17/19 0851  BP: 101/61 101/60 100/65 (!) 109/53  Pulse: 98 99 87 98  Resp: 18 16 17 16   Temp: 98.5 F (36.9 C) 98.8 F (37.1 C) 98.5 F (36.9 C) 98.6 F (37 C)  TempSrc:   Oral   SpO2: 99% 96% 97% 93%  Weight:      Height:        Intake/Output Summary (Last 24 hours) at 10/17/2019 1424 Last data filed at 10/16/2019 2000 Gross per 24 hour  Intake 120 ml  Output --  Net 120 ml   [employer]  10/15/19 0038  Weight: 90.7 kg    Examination:  General exam; NAD Respiratory system; Bilateral crackles.  Cardiovascular system: S 1, S 2 RRR Gastrointestinal system: BS present, soft, nt Central nervous system: Alert Extremities: Symmetric power.   Data Reviewed: I have personally reviewed following labs and imaging studies  CBC: Recent Labs  Lab 10/14/19 2357 10/15/19 0520 10/16/19 0500 10/17/19 0925  WBC 17.8* 16.5* 18.2* 17.8*  NEUTROABS 14.0*  --   --    --   HGB 11.1* 10.8* 10.7* 11.0*  HCT 32.4* 31.5* 30.3* 30.3*  MCV 108.7* 108.2* 105.6* 103.4*  PLT 123* 112* 117* 142*   Basic Metabolic Panel: Recent Labs  Lab 10/14/19 2357 10/15/19 0520 10/16/19 0500 10/17/19 0925  NA 128* 130* 128* 129*  K 3.2* 3.0* 5.1 2.9*  CL 93* 98 98 96*  CO2 20* 23 24 23   GLUCOSE 106* 111* 111* 125*  BUN 27* 23 20 14   CREATININE 1.29* 1.09* 0.85 0.81  CALCIUM 8.0* 7.6* 7.5* 7.7*  MG  --  1.4*  --   --    GFR: Estimated Creatinine Clearance: 81.1 mL/min (by C-G formula based on SCr of 0.81 mg/dL). Liver Function Tests: Recent Labs  Lab 10/14/19 2357 10/16/19 0500 10/17/19 0925  AST 162* 203* 229*  ALT 41 48* 66*  ALKPHOS 50 58 50  BILITOT 4.8* 5.5* 8.0*  PROT 6.8 5.9* 6.4*  ALBUMIN 2.2* 1.9* 2.0*   Recent Labs  Lab 10/14/19 2357  LIPASE 50   Recent Labs  Lab 10/14/19 2357 10/16/19 0617  AMMONIA 76* 22   Coagulation Profile: Recent Labs  Lab 10/14/19 2357 10/15/19 0520 10/16/19 0500  INR 2.3* 2.4* 2.1*   Cardiac Enzymes: No results for input(s): CKTOTAL, CKMB, CKMBINDEX, TROPONINI in the last 168 hours. BNP (last 3 results) No results for input(s): PROBNP in the last 8760 hours. HbA1C: No results for input(s): HGBA1C in the last 72 hours. CBG: Recent Labs  Lab 10/15/19 0442  GLUCAP 129*   Lipid Profile: No results for input(s): CHOL, HDL, LDLCALC, TRIG, CHOLHDL, LDLDIRECT in the last 72 hours. Thyroid Function Tests: No results for input(s): TSH, T4TOTAL, FREET4, T3FREE, THYROIDAB in the last 72 hours. Anemia Panel: Recent Labs    10/15/19 0520  VITAMINB12 500  FOLATE 5.9*   Sepsis Labs: Recent Labs  Lab 10/14/19 2357 10/15/19 0153 10/15/19 0520  PROCALCITON 3.31  --   --   LATICACIDVEN 4.4* 3.2* 2.4*    Recent Results (from the past 240 hour(s))  Blood Culture (routine x 2)     Status: Abnormal   Collection Time: 10/14/19 11:57 PM   Specimen: BLOOD RIGHT FOREARM  Result Value Ref Range Status    Specimen Description BLOOD RIGHT FOREARM  Final   Special Requests   Final    BOTTLES DRAWN AEROBIC AND ANAEROBIC Blood Culture adequate volume   Culture  Setup Time   Final    GRAM NEGATIVE RODS IN BOTH AEROBIC AND ANAEROBIC BOTTLES CRITICAL RESULT CALLED TO, READ BACK BY AND VERIFIED WITH: 10/17/19 PHARMD 1527 10/15/19 A BROWNING    Culture (A)  Final    ESCHERICHIA COLI SUSCEPTIBILITIES PERFORMED ON PREVIOUS CULTURE WITHIN THE LAST 5 DAYS. Performed at [facility], [address], [place], [address] [place]    Report Status 10/17/2019 FINAL  Final  SARS Coronavirus 2 by RT PCR (hospital order, performed in [facility] hospital lab) Nasopharyngeal Nasopharyngeal Swab     Status: None   Collection  Time: 10/14/19 11:57 PM   Specimen: Nasopharyngeal Swab  Result Value Ref Range Status   SARS Coronavirus 2 NEGATIVE NEGATIVE Final    Comment: (NOTE) SARS-CoV-2 target nucleic acids are NOT DETECTED.  The SARS-CoV-2 RNA is generally detectable in upper and lower respiratory specimens during the acute phase of infection. The lowest concentration of SARS-CoV-2 viral copies this assay can detect is 250 copies / mL. A negative result does not preclude SARS-CoV-2 infection and should not be used as the sole basis for treatment or other patient management decisions.  A negative result may occur with improper specimen collection / handling, submission of specimen other than nasopharyngeal swab, presence of viral mutation(s) within the areas targeted by this assay, and inadequate number of viral copies (<250 copies / mL). A negative result must be combined with clinical observations, patient history, and epidemiological information.  Fact Sheet for Patients:   BoilerBrush.com.cy  Fact Sheet for Healthcare Providers: https://pope.com/  This test is not yet approved or  cleared by the [place] FDA and has been authorized for detection  and/or diagnosis of SARS-CoV-2 by FDA under an Emergency Use Authorization (EUA).  This EUA will remain in effect (meaning this test can be used) for the duration of the COVID-19 declaration under Section 564(b)(1) of the Act, 21 U.S.C. section 360bbb-3(b)(1), unless the authorization is terminated or revoked sooner.  Performed at [facility], [address] [place], [place] [address]   Blood Culture (routine x 2)     Status: Abnormal   Collection Time: 10/15/19 12:08 AM   Specimen: BLOOD LEFT HAND  Result Value Ref Range Status   Specimen Description BLOOD LEFT HAND  Final   Special Requests   Final    BOTTLES DRAWN AEROBIC AND ANAEROBIC Blood Culture results may not be optimal due to an inadequate volume of blood received in culture bottles   Culture  Setup Time   Final    GRAM NEGATIVE RODS IN BOTH AEROBIC AND ANAEROBIC BOTTLES CRITICAL RESULT CALLED TO, READ BACK BY AND VERIFIED WITHPeter Minium PHARMD 6045 10/15/19 A BROWNING Performed at [facility], [address], [place], [place] [address]    Culture ESCHERICHIA COLI (A)  Final   Report Status 10/17/2019 FINAL  Final   Organism ID, Bacteria ESCHERICHIA COLI  Final      Susceptibility   Escherichia coli - MIC*    AMPICILLIN >=32 RESISTANT Resistant     CEFAZOLIN <=4 SENSITIVE Sensitive     CEFEPIME <=0.12 SENSITIVE Sensitive     CEFTAZIDIME <=1 SENSITIVE Sensitive     CEFTRIAXONE <=0.25 SENSITIVE Sensitive     CIPROFLOXACIN <=0.25 SENSITIVE Sensitive     GENTAMICIN <=1 SENSITIVE Sensitive     IMIPENEM <=0.25 SENSITIVE Sensitive     TRIMETH/SULFA <=20 SENSITIVE Sensitive     AMPICILLIN/SULBACTAM 16 INTERMEDIATE Intermediate     PIP/TAZO <=4 SENSITIVE Sensitive     * ESCHERICHIA COLI  Blood Culture ID Panel (Reflexed)     Status: Abnormal   Collection Time: 10/15/19 12:08 AM  Result Value Ref Range Status   Enterococcus species NOT DETECTED NOT DETECTED Final   Listeria monocytogenes NOT DETECTED NOT  DETECTED Final   Staphylococcus species NOT DETECTED NOT DETECTED Final   Staphylococcus aureus (BCID) NOT DETECTED NOT DETECTED Final   Streptococcus species NOT DETECTED NOT DETECTED Final   Streptococcus agalactiae NOT DETECTED NOT DETECTED Final   Streptococcus pneumoniae NOT DETECTED NOT DETECTED Final   Streptococcus pyogenes  NOT DETECTED NOT DETECTED Final   Acinetobacter baumannii NOT DETECTED NOT DETECTED Final   Enterobacteriaceae species DETECTED (A) NOT DETECTED Final    Comment: Enterobacteriaceae represent a large family of gram-negative bacteria, not a single organism. CRITICAL RESULT CALLED TO, READ BACK BY AND VERIFIED WITH: Peter Minium PHARMD 1527 10/15/19 A BROWNING    Enterobacter cloacae complex NOT DETECTED NOT DETECTED Final   Escherichia coli DETECTED (A) NOT DETECTED Final    Comment: CRITICAL RESULT CALLED TO, READ BACK BY AND VERIFIED WITH: Peter Minium PHARMD 1527 10/15/19 A BROWNING    Klebsiella oxytoca NOT DETECTED NOT DETECTED Final   Klebsiella pneumoniae NOT DETECTED NOT DETECTED Final   Proteus species NOT DETECTED NOT DETECTED Final   Serratia marcescens NOT DETECTED NOT DETECTED Final   Carbapenem resistance NOT DETECTED NOT DETECTED Final   Haemophilus influenzae NOT DETECTED NOT DETECTED Final   Neisseria meningitidis NOT DETECTED NOT DETECTED Final   Pseudomonas aeruginosa NOT DETECTED NOT DETECTED Final   Candida albicans NOT DETECTED NOT DETECTED Final   Candida glabrata NOT DETECTED NOT DETECTED Final   Candida krusei NOT DETECTED NOT DETECTED Final   Candida parapsilosis NOT DETECTED NOT DETECTED Final   Candida tropicalis NOT DETECTED NOT DETECTED Final    Comment: Performed at [facility], [address] [place], [place] [address]  Gram stain     Status: None   Collection Time: 10/16/19  2:50 PM   Specimen: Abdomen; Peritoneal Fluid  Result Value Ref Range Status   Specimen Description FLUID PERITONEAL ABDOMEN  Final   Special Requests  NONE  Final   Gram Stain   Final    WBC PRESENT,BOTH PMN AND MONONUCLEAR NO ORGANISMS SEEN CYTOSPIN SMEAR Performed at [facility], [address] [place], [place] [address]    Report Status 10/16/2019 FINAL  Final  Culture, body fluid-bottle     Status: None (Preliminary result)   Collection Time: 10/16/19  2:50 PM   Specimen: Fluid  Result Value Ref Range Status   Specimen Description FLUID PERITONEAL ABDOMEN  Final   Special Requests BOTTLES DRAWN AEROBIC AND ANAEROBIC  Final   Culture   Final    NO GROWTH < 12 HOURS Performed at [facility], [address], [place], [place] [address]    Report Status PENDING  Incomplete         Radiology Studies: IR Paracentesis  Result Date: 10/16/2019 INDICATION: Ascites secondary to alcoholic cirrhosis. Request for diagnostic and therapeutic paracentesis. EXAM: ULTRASOUND GUIDED PARACENTESIS MEDICATIONS: 1% lidocaine 15 mL COMPLICATIONS: None immediate. PROCEDURE: Informed written consent was obtained from the patient after a discussion of the risks, benefits and alternatives to treatment. A timeout was performed prior to the initiation of the procedure. Initial ultrasound scanning demonstrates a moderate amount of ascites within the right lower abdominal quadrant. The right lower abdomen was prepped and draped in the usual sterile fashion. 1% lidocaine was used for local anesthesia. Following this, a 19 gauge, 7-cm, Yueh catheter was introduced. An ultrasound image was saved for documentation purposes. The paracentesis was performed. The catheter was removed and a dressing was applied. The patient tolerated the procedure well without immediate post procedural complication. FINDINGS: A total of approximately 1.2 L of clear yellow fluid was removed. Samples were sent to the laboratory as requested by the clinical team. IMPRESSION: Successful ultrasound-guided paracentesis yielding 1.2 liters of peritoneal fluid. Read by: Corrin Parker,  PA-C Electronically Signed   By: Algis Downs  Deanne CofferHassell M.D.   On: 10/16/2019 14:43        Scheduled Meds: . folic acid  1 mg Oral Daily  . furosemide  20 mg Oral Daily  . lactulose  30 g Oral Daily  . multivitamin with minerals  1 tablet Oral Daily  . potassium chloride  40 mEq Oral Once  . spironolactone  50 mg Oral Daily  . thiamine  100 mg Oral Daily   Or  . thiamine  100 mg Intravenous Daily   Continuous Infusions: . azithromycin 500 mg (10/17/19 0538)  . cefTRIAXone (ROCEPHIN)  IV 2 g (10/16/19 1738)     LOS: 2 days    Time spent: 35 minutes    Juluis Fitzsimmons A Nickholas Goldston, MD [facility]   If 7PM-7AM, please contact night-coverage [email or web address]  10/17/2019, 2:24 PM

## 2019-10-17 NOTE — Progress Notes (Addendum)
[facility] Gastroenterology Progress Note  Shirley Mays 64 y.o. Jul 25, 1955  CC:  Cirrhosis, abdominal ascites  Subjective: Patient reports feeling well today. She has had a couple of stools since lactulose was initiated and is noi longer feeling constipated.  She denies abdominal pain, nausea, and vomiting, melena, and hematochezia.  She is tolerating a diet and had a banana and milk for breakfast.  ROS : Review of Systems  Respiratory: Negative for cough and shortness of breath.   Cardiovascular: Negative for chest pain and palpitations.  Gastrointestinal: Negative for abdominal pain, blood in stool, constipation, diarrhea, heartburn, melena, nausea and vomiting.   Objective: Vital signs in last 24 hours: Vitals:   10/16/19 2134 10/17/19 0851  BP: 100/65 (!) 109/53  Pulse: 87 98  Resp: 17 16  Temp: 98.5 F (36.9 C) 98.6 F (37 C)  SpO2: 97% 93%    Physical Exam:  General:  Alert, oriented x3, cooperative, no acute distress  Head:  Normocephalic, without obvious abnormality, atraumatic  Eyes:   Scleral icterus, EOMs intact  Lungs:    Coarse lung sounds bilaterally, respirations unlabored  Heart:  Regular rate and rhythm, S1, S2 normal  Abdomen:   Soft, non-tender, mildly distended, bowel sounds active all four quadrants,  no guarding or peritoneal signs  Extremities:  2+ bilateral pedal edema  Pulses: 2+ and symmetric    Lab Results: Recent Labs    10/15/19 0520 10/16/19 0500  NA 130* 128*  K 3.0* 5.1  CL 98 98  CO2 23 24  GLUCOSE 111* 111*  BUN 23 20  CREATININE 1.09* 0.85  CALCIUM 7.6* 7.5*  MG 1.4*  --    Recent Labs    10/14/19 2357 10/16/19 0500  AST 162* 203*  ALT 41 48*  ALKPHOS 50 58  BILITOT 4.8* 5.5*  PROT 6.8 5.9*  ALBUMIN 2.2* 1.9*   Recent Labs    10/14/19 2357 10/14/19 2357 10/15/19 0520 10/16/19 0500  WBC 17.8*   < > 16.5* 18.2*  NEUTROABS 14.0*  --   --   --   HGB 11.1*   < > 10.8* 10.7*  HCT 32.4*   < > 31.5* 30.3*  MCV 108.7*   < >  108.2* 105.6*  PLT 123*   < > 112* 117*   < > = values in this interval not displayed.   Recent Labs    10/15/19 0520 10/16/19 0500  LABPROT 25.7* 22.4*  INR 2.4* 2.1*   Impression Decompensated cirrhosis with abdominal ascites; MELD-Na of 27 as of 10/16/19 -Underwent paracentesis yesterday which yielded 1.2L of ascitic fluid.  SAAG >1.1, compatible with portal HTN. Cell count showed 590 WBCs with 33% neutrophils, no compatible with SBP but patient has been on Ceftriaxone for CAP.  Total protein in ascitic fluid <3. -Cirrhosis per imaging, abdominal distention per physical exam.  Suspect alcoholic cirrhosis.  AST>ALT is consistent with this. -Today, T bilirubin increased to 8.0 with AST 229/ALT 66/ALP 50 as compared to yesterday T bili 5.5/AST 203/ALT 48/ALP 58 -Hepatitis B/C Ab screen ordered and were negative.  Hepatitis B surface Ag pending.  Sepsis: blood culture positive for enterobacteriaceae and E. Coli so we have concern for SBP, patient currently being treated with antibiotic therapy  Chronic alcohol use  Plan: Continue empiric antibiotic therapy.  Addendum: CMP resulted and shows worsening hypokalemia (K+ of 2.9).  We will hold Lasix for now and increase spironolactone to 100mg  once daily.  Addendum: Patient has had 4 bowel movements today so  we will decrease lactulose to 10g once daily; titrate for 2-3 soft bowel movements per day.   Due to ascitic fluid total protein <3, patient will need SBP prophylaxis as an outpatient: 1 tablet of Bactrim double strength daily.  Continue to monitor LFTs.  I discussed the importance of complete alcohol cessation with the patient in the setting of cirrhosis.  Patient expressed understanding and states she plans to abstain from alcohol at time of discharge.  Recommend outpatient EGD (for variceal screening) and outpatient colonoscopy (patient past due for colon cancer screening). No signs of GI bleeding at this time, and patient  currently has sepsis, so we do not believe inpatient work-up is indicated.  [facility] will follow.  Edrick Kins PA-C 10/17/2019, 10:49 AM  Contact #  [phone]

## 2019-10-18 LAB — COMPREHENSIVE METABOLIC PANEL
ALT: 57 U/L — ABNORMAL HIGH (ref 0–44)
AST: 180 U/L — ABNORMAL HIGH (ref 15–41)
Albumin: 1.8 g/dL — ABNORMAL LOW (ref 3.5–5.0)
Alkaline Phosphatase: 53 U/L (ref 38–126)
Anion gap: 7 (ref 5–15)
BUN: 13 mg/dL (ref 8–23)
CO2: 23 mmol/L (ref 22–32)
Calcium: 7.6 mg/dL — ABNORMAL LOW (ref 8.9–10.3)
Chloride: 97 mmol/L — ABNORMAL LOW (ref 98–111)
Creatinine, Ser: 0.87 mg/dL (ref 0.44–1.00)
GFR calc Af Amer: >60 mL/min (ref >60)
GFR calc non Af Amer: >60 mL/min (ref >60)
Glucose, Bld: 91 mg/dL (ref 70–99)
Potassium: 3.2 mmol/L — ABNORMAL LOW (ref 3.5–5.1)
Sodium: 127 mmol/L — ABNORMAL LOW (ref 135–145)
Total Bilirubin: 8.2 mg/dL — ABNORMAL HIGH (ref 0.3–1.2)
Total Protein: 5.6 g/dL — ABNORMAL LOW (ref 6.5–8.1)

## 2019-10-18 LAB — URINE CULTURE: Culture: NO GROWTH

## 2019-10-18 LAB — CBC
HCT: 28.4 % — ABNORMAL LOW (ref 36.0–46.0)
Hemoglobin: 10.1 g/dL — ABNORMAL LOW (ref 12.0–15.0)
MCH: 37 pg — ABNORMAL HIGH (ref 26.0–34.0)
MCHC: 35.6 g/dL (ref 30.0–36.0)
MCV: 104 fL — ABNORMAL HIGH (ref 80.0–100.0)
Platelets: 143 10*3/uL — ABNORMAL LOW (ref 150–400)
RBC: 2.73 MIL/uL — ABNORMAL LOW (ref 3.87–5.11)
RDW: 16.2 % — ABNORMAL HIGH (ref 11.5–15.5)
WBC: 17.6 10*3/uL — ABNORMAL HIGH (ref 4.0–10.5)
nRBC: 0.1 % (ref 0.0–0.2)

## 2019-10-18 MED ORDER — POTASSIUM CHLORIDE CRYS ER 20 MEQ PO TBCR
40.0000 meq | EXTENDED_RELEASE_TABLET | Freq: Once | ORAL | Status: AC
  Administered 2019-10-18: 40 meq via ORAL
  Filled 2019-10-18: qty 2

## 2019-10-18 MED ORDER — LACTULOSE 10 GM/15ML PO SOLN
10.0000 g | Freq: Two times a day (BID) | ORAL | Status: DC
  Administered 2019-10-19: 10 g via ORAL
  Filled 2019-10-18 (×3): qty 15

## 2019-10-18 MED ORDER — SPIRONOLACTONE 25 MG PO TABS
200.0000 mg | ORAL_TABLET | Freq: Every day | ORAL | Status: DC
  Administered 2019-10-19 – 2019-10-22 (×4): 200 mg via ORAL
  Filled 2019-10-18 (×4): qty 8

## 2019-10-18 MED ORDER — ALBUMIN HUMAN 25 % IV SOLN
25.0000 g | Freq: Four times a day (QID) | INTRAVENOUS | Status: AC
  Administered 2019-10-18 – 2019-10-19 (×3): 25 g via INTRAVENOUS
  Filled 2019-10-18 (×3): qty 100

## 2019-10-18 MED ORDER — POTASSIUM CHLORIDE CRYS ER 20 MEQ PO TBCR
20.0000 meq | EXTENDED_RELEASE_TABLET | Freq: Once | ORAL | Status: AC
  Administered 2019-10-18: 20 meq via ORAL
  Filled 2019-10-18: qty 1

## 2019-10-18 NOTE — Plan of Care (Signed)

## 2019-10-18 NOTE — Progress Notes (Signed)
Subjective: Patient complains of more leg swelling today, some abdominal swelling as well. She feels very tired and complains of pain in the leg when she attempts to walk. She has been having several bowel movements with lactulose.  Objective: Vital signs in last 24 hours: Temp:  [97.9 F (36.6 C)-98.4 F (36.9 C)] 97.9 F (36.6 C) (06/19 0804) Pulse Rate:  [89-97] 97 (06/19 0804) Resp:  [14-20] 16 (06/19 0804) BP: (87-97)/(44-60) 97/60 (06/19 0804) SpO2:  [94 %-95 %] 94 % (06/19 0804) Weight change:  Last BM Date: 10/16/19  PE: Prominent icterus, mild pallor GENERAL: Alert, oriented x3, no asterixis ABDOMEN: Soft, mild distention, mild tenderness, normoactive bowel sounds EXTREMITIES: Worsening bipedal pitting  edema  Lab Results: Results for orders placed or performed during the hospital encounter of 10/14/19 (from the past 48 hour(s))  Cytology - Non PAP;     Status: None   Collection Time: 10/16/19  2:50 PM  Result Value Ref Range   CYTOLOGY - NON GYN      CYTOLOGY - NON PAP CASE: [id number] PATIENT: Shirley Mays Non-Gynecological Cytology Report     Clinical History: None provided Specimen Submitted:  A. PERITONEL FLUID, PARACENTESIS   FINAL MICROSCOPIC DIAGNOSIS: - Reactive mesothelial cells present  SPECIMEN ADEQUACY: Satisfactory for evaluation  GROSS: Received is/are: 900 cc's of gold fluid (NM:nm) Smears: 0 Concentration Method (Thin Prep): 1 Cell Block: 1 Conventional Additional Studies: 2 Hematology slides labeled [id number]     Final Diagnosis performed by Gillie Manners, MD.   Electronically signed 10/17/2019 Technical and / or Professional components performed at [employer] [employer], [place] [address], [place], [place] [address].  Immunohistochemistry Technical component (if applicable) was performed at [facility]. [address] [place], [place], [place] [address].   IMMUNOHISTOCHEMISTRY DISCLAIMER (if  applicable): Some of these immunohistochemical stains may have b een developed and the performance characteristics determine by [facility]. Some may not have been cleared or approved by the U.S. Food and Drug Administration. The FDA has determined that such clearance or approval is not necessary. This test is used for clinical purposes. It should not be regarded as investigational or for research. This laboratory is certified under the [employer] (CLIA-88) as qualified to perform high complexity clinical laboratory testing.  The controls stained appropriately.   Body fluid cell count with differential     Status: Abnormal   Collection Time: 10/16/19  2:50 PM  Result Value Ref Range   Fluid Type-FCT PERITONEAL     Comment: CORRECTED ON 06/17 AT 1459: PREVIOUSLY REPORTED AS ABDOMEN   Color, Fluid YELLOW (A) YELLOW   Appearance, Fluid HAZY (A) CLEAR   Total Nucleated Cell Count, Fluid 590 0 - 1,000 cu mm   Neutrophil Count, Fluid 33 (H) 0 - 25 %   Lymphs, Fluid 8 %   Monocyte-Macrophage-Serous Fluid 59 50 - 90 %   Eos, Fluid 0 %    Comment: Performed at [facility], [place] [address] [place], [place] [address]  Gram stain     Status: None   Collection Time: 10/16/19  2:50 PM   Specimen: Abdomen; Peritoneal Fluid  Result Value Ref Range   Specimen Description FLUID PERITONEAL ABDOMEN    Special Requests NONE    Gram Stain      WBC PRESENT,BOTH PMN AND MONONUCLEAR NO ORGANISMS SEEN CYTOSPIN SMEAR Performed at [facility], [place] [address], [place], [place] [address]  Report Status 10/16/2019 FINAL   Albumin, pleural or peritoneal fluid     Status: None   Collection Time: 10/16/19  2:50 PM  Result Value Ref Range   Albumin, Fluid <1.0 g/dL    Comment: REPEATED TO VERIFY   Fluid Type-FALB PERITONEAL     Comment: Performed at [facility], [address] [place], [place] [address] CORRECTED ON 06/17 AT 1459:  PREVIOUSLY REPORTED AS ABDOMEN   Protein, pleural or peritoneal fluid     Status: None   Collection Time: 10/16/19  2:50 PM  Result Value Ref Range   Total protein, fluid <3.0 g/dL    Comment: REPEATED TO VERIFY   Fluid Type-FTP PERITONEAL     Comment: Performed at [facility], [address], [place], [place] [address] CORRECTED ON 06/17 AT 1459: PREVIOUSLY REPORTED AS ABDOMEN   Culture, body fluid-bottle     Status: None (Preliminary result)   Collection Time: 10/16/19  2:50 PM   Specimen: Fluid  Result Value Ref Range   Specimen Description FLUID PERITONEAL ABDOMEN    Special Requests BOTTLES DRAWN AEROBIC AND ANAEROBIC    Culture      NO GROWTH 2 DAYS Performed at [facility], [address] [place], [place] [address]    Report Status PENDING   Culture, blood (routine x 2)     Status: None (Preliminary result)   Collection Time: 10/17/19  9:22 AM   Specimen: BLOOD  Result Value Ref Range   Specimen Description BLOOD RIGHT ANTECUBITAL    Special Requests      BOTTLES DRAWN AEROBIC AND ANAEROBIC Blood Culture adequate volume   Culture      NO GROWTH 1 DAY Performed at [facility], [address], [place], [place] [address]    Report Status PENDING   CBC     Status: Abnormal   Collection Time: 10/17/19  9:25 AM  Result Value Ref Range   WBC 17.8 (H) 4.0 - 10.5 K/uL   RBC 2.93 (L) 3.87 - 5.11 MIL/uL   Hemoglobin 11.0 (L) 12.0 - 15.0 g/dL   HCT 18.2 (L) 36 - 46 %   MCV 103.4 (H) 80.0 - 100.0 fL   MCH 37.5 (H) 26.0 - 34.0 pg   MCHC 36.3 (H) 30.0 - 36.0 g/dL   RDW 99.3 (H) 71.6 - 96.7 %   Platelets 142 (L) 150 - 400 K/uL   nRBC 0.1 0.0 - 0.2 %    Comment: Performed at [facility], [address], [place], [place] [address]  Comprehensive metabolic panel     Status: Abnormal   Collection Time: 10/17/19  9:25 AM  Result Value Ref Range   Sodium 129 (L) 135 - 145 mmol/L   Potassium 2.9 (L) 3.5 - 5.1 mmol/L   Chloride 96 (L) 98 - 111 mmol/L    CO2 23 22 - 32 mmol/L   Glucose, Bld 125 (H) 70 - 99 mg/dL    Comment: Glucose reference range applies only to samples taken after fasting for at least 8 hours.   BUN 14 8 - 23 mg/dL   Creatinine, Ser 0.17 0.44 - 1.00 mg/dL   Calcium 7.7 (L) 8.9 - 10.3 mg/dL   Total Protein 6.4 (L) 6.5 - 8.1 g/dL   Albumin 2.0 (L) 3.5 - 5.0 g/dL   AST 510 (H) 15 - 41 U/L   ALT 66 (H) 0 - 44 U/L   Alkaline Phosphatase 50  38 - 126 U/L   Total Bilirubin 8.0 (H) 0.3 - 1.2 mg/dL   GFR calc non Af Amer >60 >60 mL/min   GFR calc Af Amer >60 >60 mL/min   Anion gap 10 5 - 15    Comment: Performed at [facility], [address] [place], [place] [address]  Culture, blood (routine x 2)     Status: None (Preliminary result)   Collection Time: 10/17/19  9:27 AM   Specimen: BLOOD LEFT HAND  Result Value Ref Range   Specimen Description BLOOD LEFT HAND    Special Requests      BOTTLES DRAWN AEROBIC ONLY Blood Culture adequate volume   Culture      NO GROWTH 1 DAY Performed at [facility], [address] [place], [place] [address]    Report Status PENDING   Sodium, urine, random     Status: None   Collection Time: 10/17/19 10:00 AM  Result Value Ref Range   Sodium, Ur <10 mmol/L    Comment: REPEATED TO VERIFY Performed at [facility], [address], [place], [place] [address]   Osmolality, urine     Status: None   Collection Time: 10/17/19 10:00 AM  Result Value Ref Range   Osmolality, Ur 464 300 - 900 mOsm/kg    Comment: Performed at [facility], [address] [place], [place] [address]  Hepatitis C antibody     Status: None   Collection Time: 10/17/19 11:11 AM  Result Value Ref Range   HCV Ab NON REACTIVE NON REACTIVE    Comment: (NOTE) Nonreactive HCV antibody screen is consistent with no HCV infections,  unless recent infection is suspected or other evidence exists to indicate HCV infection.  Performed at [facility], [address] [place],  [place] [address]   Hepatitis B core antibody, total     Status: None   Collection Time: 10/17/19 11:11 AM  Result Value Ref Range   Hep B Core Total Ab NON REACTIVE NON REACTIVE    Comment: Performed at [facility], [address] [place], [place] [address]  Hepatitis B surface antigen     Status: None   Collection Time: 10/17/19 11:11 AM  Result Value Ref Range   Hepatitis B Surface Ag NON REACTIVE NON REACTIVE    Comment: Performed at [facility], [address] [place], [place] [address]  Comprehensive metabolic panel     Status: Abnormal   Collection Time: 10/18/19  3:35 AM  Result Value Ref Range   Sodium 127 (L) 135 - 145 mmol/L   Potassium 3.2 (L) 3.5 - 5.1 mmol/L   Chloride 97 (L) 98 - 111 mmol/L   CO2 23 22 - 32 mmol/L   Glucose, Bld 91 70 - 99 mg/dL    Comment: Glucose reference range applies only to samples taken after fasting for at least 8 hours.   BUN 13 8 - 23 mg/dL   Creatinine, Ser 4.08 0.44 - 1.00 mg/dL   Calcium 7.6 (L) 8.9 - 10.3 mg/dL   Total Protein 5.6 (L) 6.5 - 8.1 g/dL   Albumin 1.8 (L) 3.5 - 5.0 g/dL   AST 144 (H) 15 - 41 U/L   ALT 57 (H) 0 - 44 U/L   Alkaline Phosphatase 53 38 - 126 U/L   Total Bilirubin 8.2 (H) 0.3 - 1.2 mg/dL   GFR calc non Af Amer >60 >60 mL/min  GFR calc Af Amer >60 >60 mL/min   Anion gap 7 5 - 15    Comment: Performed at [facility], [address] [place], [place] [address]  CBC     Status: Abnormal   Collection Time: 10/18/19  3:35 AM  Result Value Ref Range   WBC 17.6 (H) 4.0 - 10.5 K/uL   RBC 2.73 (L) 3.87 - 5.11 MIL/uL   Hemoglobin 10.1 (L) 12.0 - 15.0 g/dL   HCT 27.2 (L) 36 - 46 %   MCV 104.0 (H) 80.0 - 100.0 fL   MCH 37.0 (H) 26.0 - 34.0 pg   MCHC 35.6 30.0 - 36.0 g/dL   RDW 53.6 (H) 64.4 - 03.4 %   Platelets 143 (L) 150 - 400 K/uL   nRBC 0.1 0.0 - 0.2 %    Comment: Performed at [facility], [address], [place], [place] [address]    Studies/Results: IR Paracentesis  Result Date:  10/16/2019 INDICATION: Ascites secondary to alcoholic cirrhosis. Request for diagnostic and therapeutic paracentesis. EXAM: ULTRASOUND GUIDED PARACENTESIS MEDICATIONS: 1% lidocaine 15 mL COMPLICATIONS: None immediate. PROCEDURE: Informed written consent was obtained from the patient after a discussion of the risks, benefits and alternatives to treatment. A timeout was performed prior to the initiation of the procedure. Initial ultrasound scanning demonstrates a moderate amount of ascites within the right lower abdominal quadrant. The right lower abdomen was prepped and draped in the usual sterile fashion. 1% lidocaine was used for local anesthesia. Following this, a 19 gauge, 7-cm, Yueh catheter was introduced. An ultrasound image was saved for documentation purposes. The paracentesis was performed. The catheter was removed and a dressing was applied. The patient tolerated the procedure well without immediate post procedural complication. FINDINGS: A total of approximately 1.2 L of clear yellow fluid was removed. Samples were sent to the laboratory as requested by the clinical team. IMPRESSION: Successful ultrasound-guided paracentesis yielding 1.2 liters of peritoneal fluid. Read by: Corrin Parker, PA-C Electronically Signed   By: Corlis Leak M.D.   On: 10/16/2019 14:43    Medications: I have reviewed the patient's current medications.  Assessment: Decompensated cirrhosis likely related to alcohol use Ascites, status post paracentesis, possible SBP as patient had elevated WBC and ANC but was treated with antibiotics for community-acquired pneumonia No signs of encephalopathy currently Worsening pedal edema with hypokalemia and hyponatremia  Plan: Increase spironolactone 200 mg daily.  Furosemide was discontinued yesterday because of hyponatremia. Plan on giving IV albumin x4 doses today. Decrease lactulose to 10 g twice daily as diarrhea may also worsen hypokalemia and hyponatremia. Continue folic acid,  multivitamin and thiamine. Possible discharge in a.m.. Will need EGD for variceal screening and screening colonoscopy as an outpatient   Kerin Salen, MD 10/18/2019, 10:09 AM

## 2019-10-18 NOTE — Progress Notes (Signed)
PROGRESS NOTE    Shirley Mays  ZOX:[id number] DOB: March 06, 1956 DOA: 10/14/2019 PCP: Shirley Mays, No Pcp Per   Brief Narrative: 64 year old with no documented past medical history presented to the ED for evaluation of fever and lethargy.  Shirley Mays was found to be febrile temperature 103, tachycardic.  Daughter told EMS that Shirley Mays has a history of ascites.  She reports 4 days of fever, chills, cough shortness of breath.  Reports a longstanding history of daily alcohol use more than 30 years.  Shirley Mays admitted with sepsis, lactic acid 4.4, leukocytosis, compensator cirrhosis Monia bacteremia.   Assessment & Plan:   Principal Problem:   CAP (community acquired pneumonia) Active Problems:   Sepsis (HCC)   Liver disease   Anemia   Thrombocytopenia (HCC)  1-Acute Hypoxic respiratory failure secondary to pneumonia: Repeat a chest x-ray this morning showed some pulmonary edema as well. Continue with IV antibiotics ceftriaxone and azithromycin. Stop IV fluids. Initially requiring 4 L , now on room air.  On oral lasix.   2-Sepsis secondary to pneumonia found to have E. coli bacteremia. Continue with IV ceftriaxone. WBC stable at 17 E coli sensitive to ceftriaxone.  Repeat Blood culture. Pending.   3-Liver disease coagulopathy, increased bilirubin. Decompensated Liver failure Likely has cirrhosis Ultrasound without significant ascites findings consistent with cirrhosis Ammonia level elevated start lactulose. Vitamin K. GI consulted. Underwent paracentesis 6/17 yielding 1.2 L. Follow fluid results. ANC 194, she will need SBP prophylaxis at discharge.  Started on lasix, spironolactone.  Appreciate GI evaluation.  Worsening LE edema, plan for IV lasix and Albumin today.   4-Alcohol  use disorder: Continue to monitor on CIWA protocol. counseling provided.  5-Hypomagnesemia: Replete magnesium 6-Hyponatremia: related to cirrhosis. Monitor on lasix.  7-Hypokalemia: replete orally.  8-Anemia:  Folic acid deficiency: Started on supplementation  Estimated body mass index is 31.32 kg/m as calculated from the following:   Height as of this encounter: 5\' 7"  (1.702 m).   Weight as of this encounter: 90.7 kg.   DVT prophylaxis: SCDs Code Status: Full code Family Communication: Care discussed with Shirley Mays Disposition Plan:  Status is: Inpatient  Remains inpatient appropriate because:Hemodynamically unstable   Dispo: The Shirley Mays is from: Home              Anticipated d/c is to: Home              Anticipated d/c date is: 3 days              Shirley Mays currently is not medically stable to d/c.        Consultants:   None  Procedures:  Right upper quadrant ultrasound:Layering sludge/stones. No definite evidence of acute cholecystitis.  Findings suggestive of cirrhosis  Small amount of perihepatic ascites.    Antimicrobials:  Ceftriaxone and azithromycin  Subjective: Report worsening LE edema. Denies worsening dyspnea. Having BM.   Objective: Vitals:   10/17/19 1514 10/17/19 2056 10/18/19 0111 10/18/19 0804  BP: (!) 94/48 (!) 87/44 (!) 96/57 97/60  Pulse: 95 89 94 97  Resp: 14 20  16   Temp: 98.1 F (36.7 C) 98.4 F (36.9 C)  97.9 F (36.6 C)  TempSrc:      SpO2:  95%  94%  Weight:      Height:        Intake/Output Summary (Last 24 hours) at 10/18/2019 0902 Last data filed at 10/17/2019 2030 Gross per 24 hour  Intake 180 ml  Output --  Net 180 ml   10/20/2019  10/15/19 0038  Weight: 90.7 kg    Examination:  General exam; NAD Respiratory system: Crackles bases Cardiovascular system: S 1, S 2 RRR Gastrointestinal system: BS present, soft, nt Central nervous system: Alert Extremities: plus 2 edema   Data Reviewed: I have personally reviewed following labs and imaging studies  CBC: Recent Labs  Lab 10/14/19 2357 10/15/19 0520 10/16/19 0500 10/17/19 0925 10/18/19 0335  WBC 17.8* 16.5* 18.2* 17.8* 17.6*  NEUTROABS 14.0*  --   --    --   --   HGB 11.1* 10.8* 10.7* 11.0* 10.1*  HCT 32.4* 31.5* 30.3* 30.3* 28.4*  MCV 108.7* 108.2* 105.6* 103.4* 104.0*  PLT 123* 112* 117* 142* 017*   Basic Metabolic Panel: Recent Labs  Lab 10/14/19 2357 10/15/19 0520 10/16/19 0500 10/17/19 0925 10/18/19 0335  NA 128* 130* 128* 129* 127*  K 3.2* 3.0* 5.1 2.9* 3.2*  CL 93* 98 98 96* 97*  CO2 20* 23 24 23 23   GLUCOSE 106* 111* 111* 125* 91  BUN 27* 23 20 14 13   CREATININE 1.29* 1.09* 0.85 0.81 0.87  CALCIUM 8.0* 7.6* 7.5* 7.7* 7.6*  MG  --  1.4*  --   --   --    GFR: Estimated Creatinine Clearance: 75.5 mL/min (by C-G formula based on SCr of 0.87 mg/dL). Liver Function Tests: Recent Labs  Lab 10/14/19 2357 10/16/19 0500 10/17/19 0925 10/18/19 0335  AST 162* 203* 229* 180*  ALT 41 48* 66* 57*  ALKPHOS 50 58 50 53  BILITOT 4.8* 5.5* 8.0* 8.2*  PROT 6.8 5.9* 6.4* 5.6*  ALBUMIN 2.2* 1.9* 2.0* 1.8*   Recent Labs  Lab 10/14/19 2357  LIPASE 50   Recent Labs  Lab 10/14/19 2357 10/16/19 0617  AMMONIA 76* 22   Coagulation Profile: Recent Labs  Lab 10/14/19 2357 10/15/19 0520 10/16/19 0500  INR 2.3* 2.4* 2.1*   Cardiac Enzymes: No results for input(s): CKTOTAL, CKMB, CKMBINDEX, TROPONINI in the last 168 hours. BNP (last 3 results) No results for input(s): PROBNP in the last 8760 hours. HbA1C: No results for input(s): HGBA1C in the last 72 hours. CBG: Recent Labs  Lab 10/15/19 0442  GLUCAP 129*   Lipid Profile: No results for input(s): CHOL, HDL, LDLCALC, TRIG, CHOLHDL, LDLDIRECT in the last 72 hours. Thyroid Function Tests: No results for input(s): TSH, T4TOTAL, FREET4, T3FREE, THYROIDAB in the last 72 hours. Anemia Panel: No results for input(s): VITAMINB12, FOLATE, FERRITIN, TIBC, IRON, RETICCTPCT in the last 72 hours. Sepsis Labs: Recent Labs  Lab 10/14/19 2357 10/15/19 0153 10/15/19 0520  PROCALCITON 3.31  --   --   LATICACIDVEN 4.4* 3.2* 2.4*    Recent Results (from the past 240  hour(s))  Blood Culture (routine x 2)     Status: Abnormal   Collection Time: 10/14/19 11:57 PM   Specimen: BLOOD RIGHT FOREARM  Result Value Ref Range Status   Specimen Description BLOOD RIGHT FOREARM  Final   Special Requests   Final    BOTTLES DRAWN AEROBIC AND ANAEROBIC Blood Culture adequate volume   Culture  Setup Time   Final    GRAM NEGATIVE RODS IN BOTH AEROBIC AND ANAEROBIC BOTTLES CRITICAL RESULT CALLED TO, READ BACK BY AND VERIFIED WITH: Andres Shad PHARMD 1527 10/15/19 A BROWNING    Culture (A)  Final    ESCHERICHIA COLI SUSCEPTIBILITIES PERFORMED ON PREVIOUS CULTURE WITHIN THE LAST 5 DAYS. Performed at [facility], [place] [address], [place], [place] [address]    Report Status  10/17/2019 FINAL  Final  SARS Coronavirus 2 by RT PCR (hospital order, performed in [facility] hospital lab) Nasopharyngeal Nasopharyngeal Swab     Status: None   Collection Time: 10/14/19 11:57 PM   Specimen: Nasopharyngeal Swab  Result Value Ref Range Status   SARS Coronavirus 2 NEGATIVE NEGATIVE Final    Comment: (NOTE) SARS-CoV-2 target nucleic acids are NOT DETECTED.  The SARS-CoV-2 RNA is generally detectable in upper and lower respiratory specimens during the acute phase of infection. The lowest concentration of SARS-CoV-2 viral copies this assay can detect is 250 copies / mL. A negative result does not preclude SARS-CoV-2 infection and should not be used as the sole basis for treatment or other Shirley Mays management decisions.  A negative result may occur with improper specimen collection / handling, submission of specimen other than nasopharyngeal swab, presence of viral mutation(s) within the areas targeted by this assay, and inadequate number of viral copies (<250 copies / mL). A negative result must be combined with clinical observations, Shirley Mays history, and epidemiological information.  Fact Sheet for Patients:   BoilerBrush.com.cyhttps://www.fda.gov/media/136312/download  Fact Sheet for  Healthcare Providers: https://pope.com/https://www.fda.gov/media/136313/download  This test is not yet approved or  cleared by the [place] FDA and has been authorized for detection and/or diagnosis of SARS-CoV-2 by FDA under an Emergency Use Authorization (EUA).  This EUA will remain in effect (meaning this test can be used) for the duration of the COVID-19 declaration under Section 564(b)(1) of the Act, 21 U.S.C. section 360bbb-3(b)(1), unless the authorization is terminated or revoked sooner.  Performed at [facility], [address] [place], [place] [address]   Blood Culture (routine x 2)     Status: Abnormal   Collection Time: 10/15/19 12:08 AM   Specimen: BLOOD LEFT HAND  Result Value Ref Range Status   Specimen Description BLOOD LEFT HAND  Final   Special Requests   Final    BOTTLES DRAWN AEROBIC AND ANAEROBIC Blood Culture results may not be optimal due to an inadequate volume of blood received in culture bottles   Culture  Setup Time   Final    GRAM NEGATIVE RODS IN BOTH AEROBIC AND ANAEROBIC BOTTLES CRITICAL RESULT CALLED TO, READ BACK BY AND VERIFIED WITHPeter Minium: J FRENS PHARMD [id number] 10/15/19 A BROWNING Performed at [facility], [address] [place], [place] [address]    Culture ESCHERICHIA COLI (A)  Final   Report Status 10/17/2019 FINAL  Final   Organism ID, Bacteria ESCHERICHIA COLI  Final      Susceptibility   Escherichia coli - MIC*    AMPICILLIN >=32 RESISTANT Resistant     CEFAZOLIN <=4 SENSITIVE Sensitive     CEFEPIME <=0.12 SENSITIVE Sensitive     CEFTAZIDIME <=1 SENSITIVE Sensitive     CEFTRIAXONE <=0.25 SENSITIVE Sensitive     CIPROFLOXACIN <=0.25 SENSITIVE Sensitive     GENTAMICIN <=1 SENSITIVE Sensitive     IMIPENEM <=0.25 SENSITIVE Sensitive     TRIMETH/SULFA <=20 SENSITIVE Sensitive     AMPICILLIN/SULBACTAM 16 INTERMEDIATE Intermediate     PIP/TAZO <=4 SENSITIVE Sensitive     * ESCHERICHIA COLI  Blood Culture ID Panel (Reflexed)     Status: Abnormal    Collection Time: 10/15/19 12:08 AM  Result Value Ref Range Status   Enterococcus species NOT DETECTED NOT DETECTED Final   Listeria monocytogenes NOT DETECTED NOT DETECTED Final   Staphylococcus species NOT DETECTED NOT DETECTED Final   Staphylococcus aureus (BCID) NOT DETECTED NOT DETECTED Final  Streptococcus species NOT DETECTED NOT DETECTED Final   Streptococcus agalactiae NOT DETECTED NOT DETECTED Final   Streptococcus pneumoniae NOT DETECTED NOT DETECTED Final   Streptococcus pyogenes NOT DETECTED NOT DETECTED Final   Acinetobacter baumannii NOT DETECTED NOT DETECTED Final   Enterobacteriaceae species DETECTED (A) NOT DETECTED Final    Comment: Enterobacteriaceae represent a large family of gram-negative bacteria, not a single organism. CRITICAL RESULT CALLED TO, READ BACK BY AND VERIFIED WITH: Peter Minium PHARMD 1527 10/15/19 A BROWNING    Enterobacter cloacae complex NOT DETECTED NOT DETECTED Final   Escherichia coli DETECTED (A) NOT DETECTED Final    Comment: CRITICAL RESULT CALLED TO, READ BACK BY AND VERIFIED WITH: Peter Minium PHARMD 1527 10/15/19 A BROWNING    Klebsiella oxytoca NOT DETECTED NOT DETECTED Final   Klebsiella pneumoniae NOT DETECTED NOT DETECTED Final   Proteus species NOT DETECTED NOT DETECTED Final   Serratia marcescens NOT DETECTED NOT DETECTED Final   Carbapenem resistance NOT DETECTED NOT DETECTED Final   Haemophilus influenzae NOT DETECTED NOT DETECTED Final   Neisseria meningitidis NOT DETECTED NOT DETECTED Final   Pseudomonas aeruginosa NOT DETECTED NOT DETECTED Final   Candida albicans NOT DETECTED NOT DETECTED Final   Candida glabrata NOT DETECTED NOT DETECTED Final   Candida krusei NOT DETECTED NOT DETECTED Final   Candida parapsilosis NOT DETECTED NOT DETECTED Final   Candida tropicalis NOT DETECTED NOT DETECTED Final    Comment: Performed at [facility], [address] [place], [place] [address]  Gram stain     Status: None   Collection  Time: 10/16/19  2:50 PM   Specimen: Abdomen; Peritoneal Fluid  Result Value Ref Range Status   Specimen Description FLUID PERITONEAL ABDOMEN  Final   Special Requests NONE  Final   Gram Stain   Final    WBC PRESENT,BOTH PMN AND MONONUCLEAR NO ORGANISMS SEEN CYTOSPIN SMEAR Performed at [facility], [address], [place], [place] [address]    Report Status 10/16/2019 FINAL  Final  Culture, body fluid-bottle     Status: None (Preliminary result)   Collection Time: 10/16/19  2:50 PM   Specimen: Fluid  Result Value Ref Range Status   Specimen Description FLUID PERITONEAL ABDOMEN  Final   Special Requests BOTTLES DRAWN AEROBIC AND ANAEROBIC  Final   Culture   Final    NO GROWTH < 12 HOURS Performed at [facility], [address] [place], [place] [address]    Report Status PENDING  Incomplete         Radiology Studies: IR Paracentesis  Result Date: 10/16/2019 INDICATION: Ascites secondary to alcoholic cirrhosis. Request for diagnostic and therapeutic paracentesis. EXAM: ULTRASOUND GUIDED PARACENTESIS MEDICATIONS: 1% lidocaine 15 mL COMPLICATIONS: None immediate. PROCEDURE: Informed written consent was obtained from the Shirley Mays after a discussion of the risks, benefits and alternatives to treatment. A timeout was performed prior to the initiation of the procedure. Initial ultrasound scanning demonstrates a moderate amount of ascites within the right lower abdominal quadrant. The right lower abdomen was prepped and draped in the usual sterile fashion. 1% lidocaine was used for local anesthesia. Following this, a 19 gauge, 7-cm, Yueh catheter was introduced. An ultrasound image was saved for documentation purposes. The paracentesis was performed. The catheter was removed and a dressing was applied. The Shirley Mays tolerated the procedure well without immediate post procedural complication. FINDINGS: A total of approximately 1.2 L of clear yellow fluid was removed. Samples were sent  to  the laboratory as requested by the clinical team. IMPRESSION: Successful ultrasound-guided paracentesis yielding 1.2 liters of peritoneal fluid. Read by: Corrin Parker, PA-C Electronically Signed   By: Corlis Leak M.D.   On: 10/16/2019 14:43        Scheduled Meds: . folic acid  1 mg Oral Daily  . lactulose  30 g Oral Daily  . multivitamin with minerals  1 tablet Oral Daily  . spironolactone  100 mg Oral Daily  . thiamine  100 mg Oral Daily   Or  . thiamine  100 mg Intravenous Daily   Continuous Infusions: . azithromycin 500 mg (10/18/19 0540)  . cefTRIAXone (ROCEPHIN)  IV 2 g (10/17/19 1755)     LOS: 3 days    Time spent: 35 minutes    Prithvi Kooi A Matraca Hunkins, MD [facility]   If 7PM-7AM, please contact night-coverage [email or web address]  10/18/2019, 9:02 AM

## 2019-10-19 LAB — CBC
HCT: 27.2 % — ABNORMAL LOW (ref 36.0–46.0)
Hemoglobin: 9.8 g/dL — ABNORMAL LOW (ref 12.0–15.0)
MCH: 37.7 pg — ABNORMAL HIGH (ref 26.0–34.0)
MCHC: 36 g/dL (ref 30.0–36.0)
MCV: 104.6 fL — ABNORMAL HIGH (ref 80.0–100.0)
Platelets: 243 10*3/uL (ref 150–400)
RBC: 2.6 MIL/uL — ABNORMAL LOW (ref 3.87–5.11)
RDW: 16.8 % — ABNORMAL HIGH (ref 11.5–15.5)
WBC: 18 10*3/uL — ABNORMAL HIGH (ref 4.0–10.5)
nRBC: 0.2 % (ref 0.0–0.2)

## 2019-10-19 LAB — BASIC METABOLIC PANEL
Anion gap: 7 (ref 5–15)
BUN: 12 mg/dL (ref 8–23)
CO2: 24 mmol/L (ref 22–32)
Calcium: 8 mg/dL — ABNORMAL LOW (ref 8.9–10.3)
Chloride: 99 mmol/L (ref 98–111)
Creatinine, Ser: 0.74 mg/dL (ref 0.44–1.00)
GFR calc Af Amer: >60 mL/min (ref >60)
GFR calc non Af Amer: >60 mL/min (ref >60)
Glucose, Bld: 86 mg/dL (ref 70–99)
Potassium: 4.4 mmol/L (ref 3.5–5.1)
Sodium: 130 mmol/L — ABNORMAL LOW (ref 135–145)

## 2019-10-19 LAB — GLUCOSE, CAPILLARY: Glucose-Capillary: 121 mg/dL — ABNORMAL HIGH (ref 70–99)

## 2019-10-19 MED ORDER — ONDANSETRON HCL 4 MG/2ML IJ SOLN: 4.0000 mg | Freq: Four times a day (QID) | INTRAMUSCULAR | Status: DC | PRN

## 2019-10-19 MED ORDER — WITCH HAZEL-GLYCERIN EX PADS
MEDICATED_PAD | Freq: Two times a day (BID) | CUTANEOUS | Status: DC | PRN
  Filled 2019-10-19 (×3): qty 100

## 2019-10-19 MED ORDER — NICOTINE 14 MG/24HR TD PT24
14.0000 mg | MEDICATED_PATCH | Freq: Every day | TRANSDERMAL | Status: DC
  Administered 2019-10-20 – 2019-10-23 (×5): 14 mg via TRANSDERMAL
  Filled 2019-10-19 (×5): qty 1

## 2019-10-19 MED ORDER — PHENYLEPH-SHARK LIV OIL-MO-PET 0.25-3-14-71.9 % RE OINT
TOPICAL_OINTMENT | Freq: Two times a day (BID) | RECTAL | Status: DC | PRN
  Filled 2019-10-19: qty 28.4

## 2019-10-19 NOTE — Progress Notes (Signed)
Subjective: Patient complains of continued leg swelling. She has been having multiple bowel movements on lactulose. Denies nausea, vomiting or abdominal pain.  Objective: Vital signs in last 24 hours: Temp:  [97.9 F (36.6 C)-98.1 F (36.7 C)] 97.9 F (36.6 C) (06/20 0831) Pulse Rate:  [82-98] 82 (06/20 0831) Resp:  [16-19] 16 (06/20 0831) BP: (95-114)/(54-65) 96/54 (06/20 0831) SpO2:  [98 %] 98 % (06/20 0831) Weight change:  Last BM Date: 10/18/19  PE: Prominent pallor, prominent icterus GENERAL: Alert, oriented x3 ABDOMEN: Soft, mild distention, no tenderness, no rebound EXTREMITIES: Bipedal pitting edema of lower extremities  Lab Results: Results for orders placed or performed during the hospital encounter of 10/14/19 (from the past 48 hour(s))  Comprehensive metabolic panel     Status: Abnormal   Collection Time: 10/18/19  3:35 AM  Result Value Ref Range   Sodium 127 (L) 135 - 145 mmol/L   Potassium 3.2 (L) 3.5 - 5.1 mmol/L   Chloride 97 (L) 98 - 111 mmol/L   CO2 23 22 - 32 mmol/L   Glucose, Bld 91 70 - 99 mg/dL    Comment: Glucose reference range applies only to samples taken after fasting for at least 8 hours.   BUN 13 8 - 23 mg/dL   Creatinine, Ser 7.42 0.44 - 1.00 mg/dL   Calcium 7.6 (L) 8.9 - 10.3 mg/dL   Total Protein 5.6 (L) 6.5 - 8.1 g/dL   Albumin 1.8 (L) 3.5 - 5.0 g/dL   AST 595 (H) 15 - 41 U/L   ALT 57 (H) 0 - 44 U/L   Alkaline Phosphatase 53 38 - 126 U/L   Total Bilirubin 8.2 (H) 0.3 - 1.2 mg/dL   GFR calc non Af Amer >60 >60 mL/min   GFR calc Af Amer >60 >60 mL/min   Anion gap 7 5 - 15    Comment: Performed at [facility], [address] [place], [place] [address]  CBC     Status: Abnormal   Collection Time: 10/18/19  3:35 AM  Result Value Ref Range   WBC 17.6 (H) 4.0 - 10.5 K/uL   RBC 2.73 (L) 3.87 - 5.11 MIL/uL   Hemoglobin 10.1 (L) 12.0 - 15.0 g/dL   HCT 64.3 (L) 36 - 46 %   MCV 104.0 (H) 80.0 - 100.0 fL   MCH 37.0 (H) 26.0 - 34.0 pg    MCHC 35.6 30.0 - 36.0 g/dL   RDW 32.9 (H) 51.8 - 84.1 %   Platelets 143 (L) 150 - 400 K/uL   nRBC 0.1 0.0 - 0.2 %    Comment: Performed at [facility], [address] [place], [place] [address]  CBC     Status: Abnormal   Collection Time: 10/19/19  6:35 AM  Result Value Ref Range   WBC 18.0 (H) 4.0 - 10.5 K/uL   RBC 2.60 (L) 3.87 - 5.11 MIL/uL   Hemoglobin 9.8 (L) 12.0 - 15.0 g/dL   HCT 01.6 (L) 36 - 46 %   MCV 104.6 (H) 80.0 - 100.0 fL   MCH 37.7 (H) 26.0 - 34.0 pg   MCHC 36.0 30.0 - 36.0 g/dL   RDW 01.0 (H) 93.2 - 35.5 %   Platelets 243 150 - 400 K/uL   nRBC 0.2 0.0 - 0.2 %    Comment: Performed at [facility], [address] [place], [place] [address]  Basic metabolic panel     Status: Abnormal   Collection  Time: 10/19/19  6:35 AM  Result Value Ref Range   Sodium 130 (L) 135 - 145 mmol/L   Potassium 4.4 3.5 - 5.1 mmol/L    Comment: SLIGHT HEMOLYSIS   Chloride 99 98 - 111 mmol/L   CO2 24 22 - 32 mmol/L   Glucose, Bld 86 70 - 99 mg/dL    Comment: Glucose reference range applies only to samples taken after fasting for at least 8 hours.   BUN 12 8 - 23 mg/dL   Creatinine, Ser 0.74 0.44 - 1.00 mg/dL   Calcium 8.0 (L) 8.9 - 10.3 mg/dL   GFR calc non Af Amer >60 >60 mL/min   GFR calc Af Amer >60 >60 mL/min   Anion gap 7 5 - 15    Comment: Performed at [facility] [address], [place], [place] [address]    Studies/Results: No results found.  Medications: I have reviewed the patient's current medications.  Assessment: Decompensated liver cirrhosis-newly diagnosed likely related to alcohol Ascites, status post paracentesis, elevated WBC and neutrophils, likely had SBP but was treated with IV antibiotics for pneumonia History of encephalopathy, on lactulose  Continues to have elevated WBC count of 18, hemoglobin relatively stable at 9.8, MCV elevated at 104.6, platelet 243 Mild hyponatremia today sodium 130, potassium normal at 4.4, normal renal  function  Plan: Continue spironolactone at 200 mg p.o. daily for today. If tomorrow sodium is above 130, start furosemide 40 mg daily and decrease spironolactone to 100 mg daily, which is to be the dose to be taken at home on discharge. Continue lactulose. Patient will need to be placed on Bactrim double strength 1 tablet p.o. daily for SBP prophylaxis on discharge. She will need to follow-up for EGD for variceal screening and colonoscopy for screening as an outpatient. GI will sign off, please recall if needed.   Ronnette Juniper, MD 10/19/2019, 12:42 PM

## 2019-10-19 NOTE — Progress Notes (Signed)
PROGRESS NOTE    Shirley Mays  FBP:[id number] DOB: 1955-10-13 DOA: 10/14/2019 PCP: Patient, No Pcp Per   Brief Narrative: 64 year old with no documented past medical history presented to the ED for evaluation of fever and lethargy.  Patient was found to be febrile temperature 103, tachycardic.  Daughter told EMS that patient has a history of ascites.  She reports 4 days of fever, chills, cough shortness of breath.  Reports a longstanding history of daily alcohol use more than 30 years.  Patient admitted with sepsis, lactic acid 4.4, leukocytosis, compensator cirrhosis Monia bacteremia.   Assessment & Plan:   Principal Problem:   CAP (community acquired pneumonia) Active Problems:   Sepsis ([place])   Liver disease   Anemia   Thrombocytopenia ([place])  1-Acute Hypoxic respiratory failure secondary to pneumonia: Repeat a chest x-ray this morning showed some pulmonary edema as well. Continue with IV antibiotics ceftriaxone and azithromycin. Stop IV fluids. Initially requiring 4 L , now on room air.  Improved.   2-Sepsis secondary to pneumonia found to have E. coli bacteremia. Continue with IV ceftriaxone. WBC 18. Continue with IV antibiotics.  E coli sensitive to ceftriaxone.  Repeat Blood culture. No growth to date.   3-Liver disease coagulopathy, increased bilirubin. Decompensated Liver failure Likely has cirrhosis Ultrasound without significant ascites findings consistent with cirrhosis Ammonia level elevated start lactulose. Vitamin K. GI consulted. Underwent paracentesis 6/17 yielding 1.2 L. Follow fluid results. Posey 194, she will need SBP prophylaxis at discharge.  Started on lasix, spironolactone.  Appreciate GI evaluation.  Received albumin. Plan to start lasix tomorrow if sodium stable.   4-Alcohol  use disorder: Continue to monitor on CIWA protocol. counseling provided.  5-Hypomagnesemia: Replete magnesium 6-Hyponatremia: related to cirrhosis. Monitor on lasix.    7-Hypokalemia: replete orally.  8-Anemia: Folic acid deficiency: Started on supplementation  Estimated body mass index is 31.32 kg/m as calculated from the following:   Height as of this encounter: 5\' 7"  (1.702 m).   Weight as of this encounter: 90.7 kg.   DVT prophylaxis: SCDs Code Status: Full code Family Communication: Care discussed with patient Disposition Plan:  Status is: Inpatient  Remains inpatient appropriate because:Hemodynamically unstable   Dispo: The patient is from: Home              Anticipated d/c is to: Home              Anticipated d/c date is: 3 days              Patient currently is not medically stable to d/c.        Consultants:   None  Procedures:  Right upper quadrant ultrasound:Layering sludge/stones. No definite evidence of acute cholecystitis.  Findings suggestive of cirrhosis  Small amount of perihepatic ascites.    Antimicrobials:  Ceftriaxone and azithromycin  Subjective: No ne complaints. Still with LE edema.   Objective: Vitals:   10/18/19 1610 10/18/19 2129 10/19/19 0600 10/19/19 0831  BP: 114/65 95/61 102/63 (!) 96/54  Pulse: 97 98 88 82  Resp: 16 19 18 16   Temp: 98 F (36.7 C) 98 F (36.7 C) 98.1 F (36.7 C) 97.9 F (36.6 C)  TempSrc:  Oral Oral   SpO2: 98% 98% 98% 98%  Weight:      Height:       No intake or output data in the 24 hours ending 10/19/19 1323 Filed Weights   10/15/19 0038  Weight: 90.7 kg    Examination:  General exam; NAD  Respiratory system: CTA Cardiovascular system: S 1, S 2 RRR Gastrointestinal system: BS present. Soft, nt Central nervous system: Alert Extremities: plus 2 edema   Data Reviewed: I have personally reviewed following labs and imaging studies  CBC: Recent Labs  Lab 10/14/19 2357 10/14/19 2357 10/15/19 0520 10/16/19 0500 10/17/19 0925 10/18/19 0335 10/19/19 0635  WBC 17.8*   < > 16.5* 18.2* 17.8* 17.6* 18.0*  NEUTROABS 14.0*  --   --   --   --   --   --    HGB 11.1*   < > 10.8* 10.7* 11.0* 10.1* 9.8*  HCT 32.4*   < > 31.5* 30.3* 30.3* 28.4* 27.2*  MCV 108.7*   < > 108.2* 105.6* 103.4* 104.0* 104.6*  PLT 123*   < > 112* 117* 142* 143* 243   < > = values in this interval not displayed.   Basic Metabolic Panel: Recent Labs  Lab 10/15/19 0520 10/16/19 0500 10/17/19 0925 10/18/19 0335 10/19/19 0635  NA 130* 128* 129* 127* 130*  K 3.0* 5.1 2.9* 3.2* 4.4  CL 98 98 96* 97* 99  CO2 23 24 23 23 24   GLUCOSE 111* 111* 125* 91 86  BUN 23 20 14 13 12   CREATININE 1.09* 0.85 0.81 0.87 0.74  CALCIUM 7.6* 7.5* 7.7* 7.6* 8.0*  MG 1.4*  --   --   --   --    GFR: Estimated Creatinine Clearance: 82.1 mL/min (by C-G formula based on SCr of 0.74 mg/dL). Liver Function Tests: Recent Labs  Lab 10/14/19 2357 10/16/19 0500 10/17/19 0925 10/18/19 0335  AST 162* 203* 229* 180*  ALT 41 48* 66* 57*  ALKPHOS 50 58 50 53  BILITOT 4.8* 5.5* 8.0* 8.2*  PROT 6.8 5.9* 6.4* 5.6*  ALBUMIN 2.2* 1.9* 2.0* 1.8*   Recent Labs  Lab 10/14/19 2357  LIPASE 50   Recent Labs  Lab 10/14/19 2357 10/16/19 0617  AMMONIA 76* 22   Coagulation Profile: Recent Labs  Lab 10/14/19 2357 10/15/19 0520 10/16/19 0500  INR 2.3* 2.4* 2.1*   Cardiac Enzymes: No results for input(s): CKTOTAL, CKMB, CKMBINDEX, TROPONINI in the last 168 hours. BNP (last 3 results) No results for input(s): PROBNP in the last 8760 hours. HbA1C: No results for input(s): HGBA1C in the last 72 hours. CBG: Recent Labs  Lab 10/15/19 0442  GLUCAP 129*   Lipid Profile: No results for input(s): CHOL, HDL, LDLCALC, TRIG, CHOLHDL, LDLDIRECT in the last 72 hours. Thyroid Function Tests: No results for input(s): TSH, T4TOTAL, FREET4, T3FREE, THYROIDAB in the last 72 hours. Anemia Panel: No results for input(s): VITAMINB12, FOLATE, FERRITIN, TIBC, IRON, RETICCTPCT in the last 72 hours. Sepsis Labs: Recent Labs  Lab 10/14/19 2357 10/15/19 0153 10/15/19 0520  PROCALCITON 3.31  --   --    LATICACIDVEN 4.4* 3.2* 2.4*    Recent Results (from the past 240 hour(s))  Blood Culture (routine x 2)     Status: Abnormal   Collection Time: 10/14/19 11:57 PM   Specimen: BLOOD RIGHT FOREARM  Result Value Ref Range Status   Specimen Description BLOOD RIGHT FOREARM  Final   Special Requests   Final    BOTTLES DRAWN AEROBIC AND ANAEROBIC Blood Culture adequate volume   Culture  Setup Time   Final    GRAM NEGATIVE RODS IN BOTH AEROBIC AND ANAEROBIC BOTTLES CRITICAL RESULT CALLED TO, READ BACK BY AND VERIFIED WITH06/18/21 PHARMD 1527 10/15/19 A BROWNING    Culture (A)  Final  ESCHERICHIA COLI SUSCEPTIBILITIES PERFORMED ON PREVIOUS CULTURE WITHIN THE LAST 5 DAYS. Performed at [facility], [address] [place], [place] [address]    Report Status 10/17/2019 FINAL  Final  SARS Coronavirus 2 by RT PCR (hospital order, performed in [facility] hospital lab) Nasopharyngeal Nasopharyngeal Swab     Status: None   Collection Time: 10/14/19 11:57 PM   Specimen: Nasopharyngeal Swab  Result Value Ref Range Status   SARS Coronavirus 2 NEGATIVE NEGATIVE Final    Comment: (NOTE) SARS-CoV-2 target nucleic acids are NOT DETECTED.  The SARS-CoV-2 RNA is generally detectable in upper and lower respiratory specimens during the acute phase of infection. The lowest concentration of SARS-CoV-2 viral copies this assay can detect is 250 copies / mL. A negative result does not preclude SARS-CoV-2 infection and should not be used as the sole basis for treatment or other patient management decisions.  A negative result may occur with improper specimen collection / handling, submission of specimen other than nasopharyngeal swab, presence of viral mutation(s) within the areas targeted by this assay, and inadequate number of viral copies (<250 copies / mL). A negative result must be combined with clinical observations, patient history, and epidemiological information.  Fact Sheet for Patients:    BoilerBrush.com.cy  Fact Sheet for Healthcare Providers: https://pope.com/  This test is not yet approved or  cleared by the [place] FDA and has been authorized for detection and/or diagnosis of SARS-CoV-2 by FDA under an Emergency Use Authorization (EUA).  This EUA will remain in effect (meaning this test can be used) for the duration of the COVID-19 declaration under Section 564(b)(1) of the Act, 21 U.S.C. section 360bbb-3(b)(1), unless the authorization is terminated or revoked sooner.  Performed at [facility], [address], [place], [place] [address]   Blood Culture (routine x 2)     Status: Abnormal   Collection Time: 10/15/19 12:08 AM   Specimen: BLOOD LEFT HAND  Result Value Ref Range Status   Specimen Description BLOOD LEFT HAND  Final   Special Requests   Final    BOTTLES DRAWN AEROBIC AND ANAEROBIC Blood Culture results may not be optimal due to an inadequate volume of blood received in culture bottles   Culture  Setup Time   Final    GRAM NEGATIVE RODS IN BOTH AEROBIC AND ANAEROBIC BOTTLES CRITICAL RESULT CALLED TO, READ BACK BY AND VERIFIED WITHPeter Minium PHARMD 9628 10/15/19 A BROWNING Performed at [facility] Lab, [address] [place], [place] [address]    Culture ESCHERICHIA COLI (A)  Final   Report Status 10/17/2019 FINAL  Final   Organism ID, Bacteria ESCHERICHIA COLI  Final      Susceptibility   Escherichia coli - MIC*    AMPICILLIN >=32 RESISTANT Resistant     CEFAZOLIN <=4 SENSITIVE Sensitive     CEFEPIME <=0.12 SENSITIVE Sensitive     CEFTAZIDIME <=1 SENSITIVE Sensitive     CEFTRIAXONE <=0.25 SENSITIVE Sensitive     CIPROFLOXACIN <=0.25 SENSITIVE Sensitive     GENTAMICIN <=1 SENSITIVE Sensitive     IMIPENEM <=0.25 SENSITIVE Sensitive     TRIMETH/SULFA <=20 SENSITIVE Sensitive     AMPICILLIN/SULBACTAM 16 INTERMEDIATE Intermediate     PIP/TAZO <=4 SENSITIVE Sensitive     * ESCHERICHIA  COLI  Blood Culture ID Panel (Reflexed)     Status: Abnormal   Collection Time: 10/15/19 12:08 AM  Result Value Ref Range Status   Enterococcus species NOT DETECTED NOT  DETECTED Final   Listeria monocytogenes NOT DETECTED NOT DETECTED Final   Staphylococcus species NOT DETECTED NOT DETECTED Final   Staphylococcus aureus (BCID) NOT DETECTED NOT DETECTED Final   Streptococcus species NOT DETECTED NOT DETECTED Final   Streptococcus agalactiae NOT DETECTED NOT DETECTED Final   Streptococcus pneumoniae NOT DETECTED NOT DETECTED Final   Streptococcus pyogenes NOT DETECTED NOT DETECTED Final   Acinetobacter baumannii NOT DETECTED NOT DETECTED Final   Enterobacteriaceae species DETECTED (A) NOT DETECTED Final    Comment: Enterobacteriaceae represent a large family of gram-negative bacteria, not a single organism. CRITICAL RESULT CALLED TO, READ BACK BY AND VERIFIED WITH: Peter MiniumJ FRENS PHARMD 1527 10/15/19 A BROWNING    Enterobacter cloacae complex NOT DETECTED NOT DETECTED Final   Escherichia coli DETECTED (A) NOT DETECTED Final    Comment: CRITICAL RESULT CALLED TO, READ BACK BY AND VERIFIED WITH: Peter MiniumJ FRENS PHARMD 1527 10/15/19 A BROWNING    Klebsiella oxytoca NOT DETECTED NOT DETECTED Final   Klebsiella pneumoniae NOT DETECTED NOT DETECTED Final   Proteus species NOT DETECTED NOT DETECTED Final   Serratia marcescens NOT DETECTED NOT DETECTED Final   Carbapenem resistance NOT DETECTED NOT DETECTED Final   Haemophilus influenzae NOT DETECTED NOT DETECTED Final   Neisseria meningitidis NOT DETECTED NOT DETECTED Final   Pseudomonas aeruginosa NOT DETECTED NOT DETECTED Final   Candida albicans NOT DETECTED NOT DETECTED Final   Candida glabrata NOT DETECTED NOT DETECTED Final   Candida krusei NOT DETECTED NOT DETECTED Final   Candida parapsilosis NOT DETECTED NOT DETECTED Final   Candida tropicalis NOT DETECTED NOT DETECTED Final    Comment: Performed at [facility], [address]  [place], [place] [address]  Gram stain     Status: None   Collection Time: 10/16/19  2:50 PM   Specimen: Abdomen; Peritoneal Fluid  Result Value Ref Range Status   Specimen Description FLUID PERITONEAL ABDOMEN  Final   Special Requests NONE  Final   Gram Stain   Final    WBC PRESENT,BOTH PMN AND MONONUCLEAR NO ORGANISMS SEEN CYTOSPIN SMEAR Performed at [facility], [address] [place], [place] [address]    Report Status 10/16/2019 FINAL  Final  Culture, body fluid-bottle     Status: None (Preliminary result)   Collection Time: 10/16/19  2:50 PM   Specimen: Fluid  Result Value Ref Range Status   Specimen Description FLUID PERITONEAL ABDOMEN  Final   Special Requests BOTTLES DRAWN AEROBIC AND ANAEROBIC  Final   Culture   Final    NO GROWTH 3 DAYS Performed at [facility], [address] [place], [place] [address]    Report Status PENDING  Incomplete  Culture, blood (routine x 2)     Status: None (Preliminary result)   Collection Time: 10/17/19  9:22 AM   Specimen: BLOOD  Result Value Ref Range Status   Specimen Description BLOOD RIGHT ANTECUBITAL  Final   Special Requests   Final    BOTTLES DRAWN AEROBIC AND ANAEROBIC Blood Culture adequate volume   Culture   Final    NO GROWTH 2 DAYS Performed at [facility], [address] [place], [place] [address]    Report Status PENDING  Incomplete  Culture, blood (routine x 2)     Status: None (Preliminary result)   Collection Time: 10/17/19  9:27 AM   Specimen: BLOOD LEFT HAND  Result Value Ref Range Status   Specimen Description BLOOD LEFT HAND  Final   Special Requests   Final    BOTTLES DRAWN AEROBIC ONLY Blood Culture adequate volume   Culture   Final    NO GROWTH 2 DAYS Performed at [facility], [address] [place], [place] [address]    Report Status PENDING  Incomplete  Urine culture     Status: None   Collection Time: 10/17/19 10:00 AM   Specimen: In/Out Cath Urine  Result Value Ref  Range Status   Specimen Description IN/OUT CATH URINE  Final   Special Requests NONE  Final   Culture   Final    NO GROWTH Performed at [facility], [address], [place], [place] [address]    Report Status 10/18/2019 FINAL  Final         Radiology Studies: No results found.      Scheduled Meds:  folic acid  1 mg Oral Daily   lactulose  10 g Oral BID   multivitamin with minerals  1 tablet Oral Daily   spironolactone  200 mg Oral Daily   thiamine  100 mg Oral Daily   Continuous Infusions:  cefTRIAXone (ROCEPHIN)  IV 2 g (10/18/19 1712)     LOS: 4 days    Time spent: 35 minutes    Iram Lundberg A Adib Wahba, MD [facility]   If 7PM-7AM, please contact night-coverage [email or web address]  10/19/2019, 1:23 PM

## 2019-10-20 ENCOUNTER — Inpatient Hospital Stay (HOSPITAL_COMMUNITY): Payer: Self-pay

## 2019-10-20 LAB — CBC
HCT: 26.7 % — ABNORMAL LOW (ref 36.0–46.0)
Hemoglobin: 9.6 g/dL — ABNORMAL LOW (ref 12.0–15.0)
MCH: 37.4 pg — ABNORMAL HIGH (ref 26.0–34.0)
MCHC: 36 g/dL (ref 30.0–36.0)
MCV: 103.9 fL — ABNORMAL HIGH (ref 80.0–100.0)
Platelets: 267 10*3/uL (ref 150–400)
RBC: 2.57 MIL/uL — ABNORMAL LOW (ref 3.87–5.11)
RDW: 16.7 % — ABNORMAL HIGH (ref 11.5–15.5)
WBC: 20.5 10*3/uL — ABNORMAL HIGH (ref 4.0–10.5)
nRBC: 0 % (ref 0.0–0.2)

## 2019-10-20 LAB — BASIC METABOLIC PANEL
Anion gap: 7 (ref 5–15)
BUN: 9 mg/dL (ref 8–23)
CO2: 22 mmol/L (ref 22–32)
Calcium: 7.9 mg/dL — ABNORMAL LOW (ref 8.9–10.3)
Chloride: 100 mmol/L (ref 98–111)
Creatinine, Ser: 0.8 mg/dL (ref 0.44–1.00)
GFR calc Af Amer: >60 mL/min (ref >60)
GFR calc non Af Amer: >60 mL/min (ref >60)
Glucose, Bld: 102 mg/dL — ABNORMAL HIGH (ref 70–99)
Potassium: 3.9 mmol/L (ref 3.5–5.1)
Sodium: 129 mmol/L — ABNORMAL LOW (ref 135–145)

## 2019-10-20 LAB — PROTIME-INR
INR: 2.2 — ABNORMAL HIGH (ref 0.8–1.2)
Prothrombin Time: 23.5 seconds — ABNORMAL HIGH (ref 11.4–15.2)

## 2019-10-20 LAB — GLUCOSE, CAPILLARY
Glucose-Capillary: 109 mg/dL — ABNORMAL HIGH (ref 70–99)
Glucose-Capillary: 214 mg/dL — ABNORMAL HIGH (ref 70–99)

## 2019-10-20 LAB — HEMOGLOBIN AND HEMATOCRIT, BLOOD
HCT: 28.3 % — ABNORMAL LOW (ref 36.0–46.0)
Hemoglobin: 10 g/dL — ABNORMAL LOW (ref 12.0–15.0)

## 2019-10-20 MED ORDER — VITAMIN K1 10 MG/ML IJ SOLN
10.0000 mg | Freq: Once | INTRAVENOUS | Status: AC
  Administered 2019-10-20: 10 mg via INTRAVENOUS
  Filled 2019-10-20: qty 1

## 2019-10-20 MED ORDER — HYDROCORTISONE 1 % EX CREA
TOPICAL_CREAM | Freq: Two times a day (BID) | CUTANEOUS | Status: DC
  Filled 2019-10-20: qty 28

## 2019-10-20 MED ORDER — SODIUM CHLORIDE 0.9 % IV SOLN
2.0000 g | INTRAVENOUS | Status: DC
  Administered 2019-10-20 – 2019-10-23 (×4): 2 g via INTRAVENOUS
  Filled 2019-10-20 (×3): qty 2
  Filled 2019-10-20: qty 20

## 2019-10-20 MED ORDER — CEFAZOLIN SODIUM-DEXTROSE 2-4 GM/100ML-% IV SOLN: 2.0000 g | Freq: Three times a day (TID) | INTRAVENOUS | Status: DC

## 2019-10-20 NOTE — Progress Notes (Addendum)
Ok to change ceftriaxone to cefazolin per Dr. Sunnie Nielsen.  Addendum:  Dr Sunnie Nielsen would like to continue ceftriaxone for now for bacteremia and SBP coverage  Ulyses Southward, PharmD, BCIDP, AAHIVP, CPP Infectious Disease Pharmacist 10/20/2019 10:28 AM

## 2019-10-20 NOTE — TOC Progression Note (Signed)
Transition of Care [facility]) - Progression Note    Patient Details  Name: Shirley Mays MRN: [id number] Date of Birth: Feb 23, 1956  [facility] CM/SW Contact  Beckie Busing, RN Phone Number: [phone]  10/20/2019, 12:11 PM  Clinical Narrative:    Per bedside nurse patient has  a packet that she was told to complete to get her hospital bills paid.CM has left message for Fabio Neighbors the financial counselor.          Expected Discharge Plan and Services                                                 Social Determinants of Health (SDOH) Interventions    Readmission Risk Interventions No flowsheet data found.

## 2019-10-20 NOTE — Progress Notes (Signed)
PROGRESS NOTE    Shirley Mays  YTK:[id number] DOB: 10-09-1955 DOA: 10/14/2019 PCP: Patient, No Pcp Per   Brief Narrative: 64 year old with no documented past medical history presented to the ED for evaluation of fever and lethargy.  Patient was found to be febrile temperature 103, tachycardic.  Daughter told EMS that patient has a history of ascites.  She reports 4 days of fever, chills, cough shortness of breath.  Reports a longstanding history of daily alcohol use more than 30 years.  Patient admitted with sepsis, lactic acid 4.4, leukocytosis, compensator cirrhosis Monia bacteremia.   Assessment & Plan:   Principal Problem:   CAP (community acquired pneumonia) Active Problems:   Sepsis (HCC)   Liver disease   Anemia   Thrombocytopenia (HCC)  1-Acute Hypoxic respiratory failure secondary to pneumonia: Repeat a chest x-ray this morning showed some pulmonary edema as well. Continue with IV antibiotics ceftriaxone and azithromycin. Stop IV fluids. Initially requiring 4 L , now on room air.  Improved.  Chest x ray improved.   2-Sepsis secondary to pneumonia found to have E. coli bacteremia. Continue with IV ceftriaxone. Continue with IV antibiotics, ceftriaxone.  E coli sensitive to ceftriaxone.  Repeat Blood culture 10-17-2019. No growth to date.  Leukocytosis; WBC up today, chest x ray improved. Continue with current antibiotics.   3-Liver disease coagulopathy, increased bilirubin. Decompensated Liver failure Likely has cirrhosis Ultrasound without significant ascites findings consistent with cirrhosis Ammonia level elevated start lactulose. Vitamin K. GI consulted. Underwent paracentesis 6/17 yielding 1.2 L. Follow fluid results. ANC 194, she will need SBP prophylaxis at discharge.  Started on  spironolactone.  Appreciate GI evaluation.  Sodium down to 129. Wont start lasix. Weight down to 182 from 200 on admission.   4-Alcohol  use disorder: Continue to monitor on CIWA  protocol. counseling provided.   5-Hypomagnesemia: Replete magnesium.  6-Hyponatremia: related to cirrhosis. Monitor on lasix.   7-Hypokalemia: replete orally.   8-Anemia: Folic acid deficiency: Started on supplementation.  9-Hemorrhoid; report blood tissue paper. Repeat hb tonight. Start hydrocortisone.  Hold lactulose.   Estimated body mass index is 28.6 kg/m as calculated from the following:   Height as of this encounter: 5\' 7"  (1.702 m).   Weight as of this encounter: 82.8 kg.   DVT prophylaxis: SCDs Code Status: Full code Family Communication: Care discussed with patient Disposition Plan:  Status is: Inpatient  Remains inpatient appropriate because:Hemodynamically unstable   Dispo: The patient is from: Home              Anticipated d/c is to: Home              Anticipated d/c date is: 3 days              Patient currently is not medically stable to d/c.        Consultants:   None  Procedures:  Right upper quadrant ultrasound:Layering sludge/stones. No definite evidence of acute cholecystitis.  Findings suggestive of cirrhosis  Small amount of perihepatic ascites.    Antimicrobials:  Ceftriaxone and azithromycin  Subjective: Denies abdominal pain. Having multiples BM, notice blood tissue paper.  Dyspnea improved.   Objective: Vitals:   10/20/19 0559 10/20/19 0727 10/20/19 0923 10/20/19 1033  BP: (!) 100/56 109/64 109/64   Pulse: 100 95 95   Resp: 18     Temp: 98.5 F (36.9 C) 98.1 F (36.7 C)    TempSrc: Oral     SpO2: 96% 97%    Weight:  82.8 kg  Height:       No intake or output data in the 24 hours ending 10/20/19 1347 Filed Weights   10/15/19 0038 10/20/19 1033  Weight: 90.7 kg 82.8 kg    Examination:  General exam; NAD Respiratory system: CTA Cardiovascular system: S 1, S 2 RRR Gastrointestinal system: BS present, soft, nt Central nervous system: alert Extremities: plus 2 edema   Data Reviewed: I have personally  reviewed following labs and imaging studies  CBC: Recent Labs  Lab 10/14/19 2357 10/15/19 0520 10/16/19 0500 10/17/19 0925 10/18/19 0335 10/19/19 0635 10/20/19 0358  WBC 17.8*   < > 18.2* 17.8* 17.6* 18.0* 20.5*  NEUTROABS 14.0*  --   --   --   --   --   --   HGB 11.1*   < > 10.7* 11.0* 10.1* 9.8* 9.6*  HCT 32.4*   < > 30.3* 30.3* 28.4* 27.2* 26.7*  MCV 108.7*   < > 105.6* 103.4* 104.0* 104.6* 103.9*  PLT 123*   < > 117* 142* 143* 243 267   < > = values in this interval not displayed.   Basic Metabolic Panel: Recent Labs  Lab 10/15/19 0520 10/15/19 0520 10/16/19 0500 10/17/19 0925 10/18/19 0335 10/19/19 0635 10/20/19 0358  NA 130*   < > 128* 129* 127* 130* 129*  K 3.0*   < > 5.1 2.9* 3.2* 4.4 3.9  CL 98   < > 98 96* 97* 99 100  CO2 23   < > 24 23 23 24 22   GLUCOSE 111*   < > 111* 125* 91 86 102*  BUN 23   < > 20 14 13 12 9   CREATININE 1.09*   < > 0.85 0.81 0.87 0.74 0.80  CALCIUM 7.6*   < > 7.5* 7.7* 7.6* 8.0* 7.9*  MG 1.4*  --   --   --   --   --   --    < > = values in this interval not displayed.   GFR: Estimated Creatinine Clearance: 78.6 mL/min (by C-G formula based on SCr of 0.8 mg/dL). Liver Function Tests: Recent Labs  Lab 10/14/19 2357 10/16/19 0500 10/17/19 0925 10/18/19 0335  AST 162* 203* 229* 180*  ALT 41 48* 66* 57*  ALKPHOS 50 58 50 53  BILITOT 4.8* 5.5* 8.0* 8.2*  PROT 6.8 5.9* 6.4* 5.6*  ALBUMIN 2.2* 1.9* 2.0* 1.8*   Recent Labs  Lab 10/14/19 2357  LIPASE 50   Recent Labs  Lab 10/14/19 2357 10/16/19 0617  AMMONIA 76* 22   Coagulation Profile: Recent Labs  Lab 10/14/19 2357 10/15/19 0520 10/16/19 0500 10/20/19 1101  INR 2.3* 2.4* 2.1* 2.2*   Cardiac Enzymes: No results for input(s): CKTOTAL, CKMB, CKMBINDEX, TROPONINI in the last 168 hours. BNP (last 3 results) No results for input(s): PROBNP in the last 8760 hours. HbA1C: No results for input(s): HGBA1C in the last 72 hours. CBG: Recent Labs  Lab 10/15/19 0442  10/19/19 2213 10/20/19 0729 10/20/19 1116  GLUCAP 129* 121* 109* 214*   Lipid Profile: No results for input(s): CHOL, HDL, LDLCALC, TRIG, CHOLHDL, LDLDIRECT in the last 72 hours. Thyroid Function Tests: No results for input(s): TSH, T4TOTAL, FREET4, T3FREE, THYROIDAB in the last 72 hours. Anemia Panel: No results for input(s): VITAMINB12, FOLATE, FERRITIN, TIBC, IRON, RETICCTPCT in the last 72 hours. Sepsis Labs: Recent Labs  Lab 10/14/19 2357 10/15/19 0153 10/15/19 0520  PROCALCITON 3.31  --   --   LATICACIDVEN 4.4* 3.2*  2.4*    Recent Results (from the past 240 hour(s))  Blood Culture (routine x 2)     Status: Abnormal   Collection Time: 10/14/19 11:57 PM   Specimen: BLOOD RIGHT FOREARM  Result Value Ref Range Status   Specimen Description BLOOD RIGHT FOREARM  Final   Special Requests   Final    BOTTLES DRAWN AEROBIC AND ANAEROBIC Blood Culture adequate volume   Culture  Setup Time   Final    GRAM NEGATIVE RODS IN BOTH AEROBIC AND ANAEROBIC BOTTLES CRITICAL RESULT CALLED TO, READ BACK BY AND VERIFIED WITH: Andres Shad PHARMD 1527 10/15/19 A BROWNING    Culture (A)  Final    ESCHERICHIA COLI SUSCEPTIBILITIES PERFORMED ON PREVIOUS CULTURE WITHIN THE LAST 5 DAYS. Performed at [facility], [place] [address] [place], [place] [address]    Report Status 10/17/2019 FINAL  Final  SARS Coronavirus 2 by RT PCR (hospital order, performed in [facility] hospital lab) Nasopharyngeal Nasopharyngeal Swab     Status: None   Collection Time: 10/14/19 11:57 PM   Specimen: Nasopharyngeal Swab  Result Value Ref Range Status   SARS Coronavirus 2 NEGATIVE NEGATIVE Final    Comment: (NOTE) SARS-CoV-2 target nucleic acids are NOT DETECTED.  The SARS-CoV-2 RNA is generally detectable in upper and lower respiratory specimens during the acute phase of infection. The lowest concentration of SARS-CoV-2 viral copies this assay can detect is 250 copies / mL. A negative result does not preclude  SARS-CoV-2 infection and should not be used as the sole basis for treatment or other patient management decisions.  A negative result may occur with improper specimen collection / handling, submission of specimen other than nasopharyngeal swab, presence of viral mutation(s) within the areas targeted by this assay, and inadequate number of viral copies (<250 copies / mL). A negative result must be combined with clinical observations, patient history, and epidemiological information.  Fact Sheet for Patients:   StrictlyIdeas.no  Fact Sheet for Healthcare Providers: BankingDealers.co.za  This test is not yet approved or  cleared by the [place] FDA and has been authorized for detection and/or diagnosis of SARS-CoV-2 by FDA under an Emergency Use Authorization (EUA).  This EUA will remain in effect (meaning this test can be used) for the duration of the COVID-19 declaration under Section 564(b)(1) of the Act, 21 U.S.C. section 360bbb-3(b)(1), unless the authorization is terminated or revoked sooner.  Performed at [facility], [place] [address] [place], [place] [address]   Blood Culture (routine x 2)     Status: Abnormal   Collection Time: 10/15/19 12:08 AM   Specimen: BLOOD LEFT HAND  Result Value Ref Range Status   Specimen Description BLOOD LEFT HAND  Final   Special Requests   Final    BOTTLES DRAWN AEROBIC AND ANAEROBIC Blood Culture results may not be optimal due to an inadequate volume of blood received in culture bottles   Culture  Setup Time   Final    GRAM NEGATIVE RODS IN BOTH AEROBIC AND ANAEROBIC BOTTLES CRITICAL RESULT CALLED TO, READ BACK BY AND VERIFIED WITHAndres Shad PHARMD 0998 10/15/19 A BROWNING Performed at [facility], [place] [address] [place],  [address]    Culture ESCHERICHIA COLI (A)  Final   Report Status 10/17/2019 FINAL  Final   Organism ID, Bacteria ESCHERICHIA COLI  Final       Susceptibility   Escherichia coli - MIC*    AMPICILLIN >=32 RESISTANT Resistant  CEFAZOLIN <=4 SENSITIVE Sensitive     CEFEPIME <=0.12 SENSITIVE Sensitive     CEFTAZIDIME <=1 SENSITIVE Sensitive     CEFTRIAXONE <=0.25 SENSITIVE Sensitive     CIPROFLOXACIN <=0.25 SENSITIVE Sensitive     GENTAMICIN <=1 SENSITIVE Sensitive     IMIPENEM <=0.25 SENSITIVE Sensitive     TRIMETH/SULFA <=20 SENSITIVE Sensitive     AMPICILLIN/SULBACTAM 16 INTERMEDIATE Intermediate     PIP/TAZO <=4 SENSITIVE Sensitive     * ESCHERICHIA COLI  Blood Culture ID Panel (Reflexed)     Status: Abnormal   Collection Time: 10/15/19 12:08 AM  Result Value Ref Range Status   Enterococcus species NOT DETECTED NOT DETECTED Final   Listeria monocytogenes NOT DETECTED NOT DETECTED Final   Staphylococcus species NOT DETECTED NOT DETECTED Final   Staphylococcus aureus (BCID) NOT DETECTED NOT DETECTED Final   Streptococcus species NOT DETECTED NOT DETECTED Final   Streptococcus agalactiae NOT DETECTED NOT DETECTED Final   Streptococcus pneumoniae NOT DETECTED NOT DETECTED Final   Streptococcus pyogenes NOT DETECTED NOT DETECTED Final   Acinetobacter baumannii NOT DETECTED NOT DETECTED Final   Enterobacteriaceae species DETECTED (A) NOT DETECTED Final    Comment: Enterobacteriaceae represent a large family of gram-negative bacteria, not a single organism. CRITICAL RESULT CALLED TO, READ BACK BY AND VERIFIED WITH: Peter Minium PHARMD 1527 10/15/19 A BROWNING    Enterobacter cloacae complex NOT DETECTED NOT DETECTED Final   Escherichia coli DETECTED (A) NOT DETECTED Final    Comment: CRITICAL RESULT CALLED TO, READ BACK BY AND VERIFIED WITH: Peter Minium PHARMD 1527 10/15/19 A BROWNING    Klebsiella oxytoca NOT DETECTED NOT DETECTED Final   Klebsiella pneumoniae NOT DETECTED NOT DETECTED Final   Proteus species NOT DETECTED NOT DETECTED Final   Serratia marcescens NOT DETECTED NOT DETECTED Final   Carbapenem resistance NOT DETECTED  NOT DETECTED Final   Haemophilus influenzae NOT DETECTED NOT DETECTED Final   Neisseria meningitidis NOT DETECTED NOT DETECTED Final   Pseudomonas aeruginosa NOT DETECTED NOT DETECTED Final   Candida albicans NOT DETECTED NOT DETECTED Final   Candida glabrata NOT DETECTED NOT DETECTED Final   Candida krusei NOT DETECTED NOT DETECTED Final   Candida parapsilosis NOT DETECTED NOT DETECTED Final   Candida tropicalis NOT DETECTED NOT DETECTED Final    Comment: Performed at [facility], [address] [place], [place] [address]  Gram stain     Status: None   Collection Time: 10/16/19  2:50 PM   Specimen: Abdomen; Peritoneal Fluid  Result Value Ref Range Status   Specimen Description FLUID PERITONEAL ABDOMEN  Final   Special Requests NONE  Final   Gram Stain   Final    WBC PRESENT,BOTH PMN AND MONONUCLEAR NO ORGANISMS SEEN CYTOSPIN SMEAR Performed at [facility], [address] [place], [place] [address]    Report Status 10/16/2019 FINAL  Final  Culture, body fluid-bottle     Status: None (Preliminary result)   Collection Time: 10/16/19  2:50 PM   Specimen: Fluid  Result Value Ref Range Status   Specimen Description FLUID PERITONEAL ABDOMEN  Final   Special Requests BOTTLES DRAWN AEROBIC AND ANAEROBIC  Final   Culture   Final    NO GROWTH 4 DAYS Performed at [facility], [address] [place], [place] [address]    Report Status PENDING  Incomplete  Culture, blood (routine x 2)     Status: None (Preliminary result)   Collection Time: 10/17/19  9:22 AM   Specimen: BLOOD  Result Value Ref Range Status   Specimen Description BLOOD RIGHT ANTECUBITAL  Final   Special Requests   Final    BOTTLES DRAWN AEROBIC AND ANAEROBIC Blood Culture adequate volume   Culture   Final    NO GROWTH 3 DAYS Performed at [facility], [address] [place] [address], [place], [place] [address]    Report Status PENDING  Incomplete  Culture, blood (routine x 2)     Status: None (Preliminary  result)   Collection Time: 10/17/19  9:27 AM   Specimen: BLOOD LEFT HAND  Result Value Ref Range Status   Specimen Description BLOOD LEFT HAND  Final   Special Requests   Final    BOTTLES DRAWN AEROBIC ONLY Blood Culture adequate volume   Culture   Final    NO GROWTH 3 DAYS Performed at [facility], [address], [place], [place] [address]    Report Status PENDING  Incomplete  Urine culture     Status: None   Collection Time: 10/17/19 10:00 AM   Specimen: In/Out Cath Urine  Result Value Ref Range Status   Specimen Description IN/OUT CATH URINE  Final   Special Requests NONE  Final   Culture   Final    NO GROWTH Performed at [facility], [address], [place], [place] [address]    Report Status 10/18/2019 FINAL  Final         Radiology Studies: DG Chest 2 View  Result Date: 10/20/2019 CLINICAL DATA:  Leukocytosis. EXAM: CHEST - 2 VIEW COMPARISON:  10/15/2019 FINDINGS: Significant improvement in bilateral airspace disease. Mild residual bibasilar atelectasis or infiltrate. Small left effusion. Negative for vascular congestion or edema. IMPRESSION: Significant improvement in bilateral airspace disease. Possible clearing of edema or pneumonia. Electronically Signed   By: Marlan Palau M.D.   On: 10/20/2019 10:04        Scheduled Meds: . folic acid  1 mg Oral Daily  . hydrocortisone cream   Topical BID  . multivitamin with minerals  1 tablet Oral Daily  . nicotine  14 mg Transdermal Daily  . spironolactone  200 mg Oral Daily  . thiamine  100 mg Oral Daily   Continuous Infusions: . cefTRIAXone (ROCEPHIN)  IV 2 g (10/20/19 1151)  . phytonadione (VITAMIN K) IV 10 mg (10/20/19 1323)     LOS: 5 days    Time spent: 35 minutes    Lannah Koike A Yunus Stoklosa, MD [facility]   If 7PM-7AM, please contact night-coverage [email or web address]  10/20/2019, 1:47 PM

## 2019-10-21 ENCOUNTER — Inpatient Hospital Stay (HOSPITAL_COMMUNITY): Payer: Self-pay

## 2019-10-21 LAB — CBC
HCT: 30.6 % — ABNORMAL LOW (ref 36.0–46.0)
Hemoglobin: 10.7 g/dL — ABNORMAL LOW (ref 12.0–15.0)
MCH: 37.2 pg — ABNORMAL HIGH (ref 26.0–34.0)
MCHC: 35 g/dL (ref 30.0–36.0)
MCV: 106.3 fL — ABNORMAL HIGH (ref 80.0–100.0)
Platelets: 350 10*3/uL (ref 150–400)
RBC: 2.88 MIL/uL — ABNORMAL LOW (ref 3.87–5.11)
RDW: 17.2 % — ABNORMAL HIGH (ref 11.5–15.5)
WBC: 23.1 10*3/uL — ABNORMAL HIGH (ref 4.0–10.5)
nRBC: 0 % (ref 0.0–0.2)

## 2019-10-21 LAB — BASIC METABOLIC PANEL
Anion gap: 8 (ref 5–15)
BUN: 10 mg/dL (ref 8–23)
CO2: 20 mmol/L — ABNORMAL LOW (ref 22–32)
Calcium: 8.2 mg/dL — ABNORMAL LOW (ref 8.9–10.3)
Chloride: 100 mmol/L (ref 98–111)
Creatinine, Ser: 0.94 mg/dL (ref 0.44–1.00)
GFR calc Af Amer: >60 mL/min (ref >60)
GFR calc non Af Amer: >60 mL/min (ref >60)
Glucose, Bld: 160 mg/dL — ABNORMAL HIGH (ref 70–99)
Potassium: 4.5 mmol/L (ref 3.5–5.1)
Sodium: 128 mmol/L — ABNORMAL LOW (ref 135–145)

## 2019-10-21 LAB — CULTURE, BODY FLUID W GRAM STAIN -BOTTLE: Culture: NO GROWTH

## 2019-10-21 MED ORDER — SACCHAROMYCES BOULARDII 250 MG PO CAPS
250.0000 mg | ORAL_CAPSULE | Freq: Two times a day (BID) | ORAL | Status: DC
  Administered 2019-10-21 – 2019-10-23 (×5): 250 mg via ORAL
  Filled 2019-10-21 (×5): qty 1

## 2019-10-21 MED ORDER — IOHEXOL 300 MG/ML  SOLN
100.0000 mL | Freq: Once | INTRAMUSCULAR | Status: AC | PRN
  Administered 2019-10-21: 100 mL via INTRAVENOUS

## 2019-10-21 NOTE — Progress Notes (Signed)
PROGRESS NOTE    Kinlee Garrison  JAS:[id number] DOB: 12-02-1955 DOA: 10/14/2019 PCP: Patient, No Pcp Per   Brief Narrative: 64 year old with no documented past medical history presented to the ED for evaluation of fever and lethargy.  Patient was found to be febrile temperature 103, tachycardic.  Daughter told EMS that patient has a history of ascites.  She reports 4 days of fever, chills, cough shortness of breath.  Reports a longstanding history of daily alcohol use more than 30 years.  Patient admitted with sepsis, lactic acid 4.4, leukocytosis, compensator cirrhosis Monia bacteremia.   Assessment & Plan:   Principal Problem:   CAP (community acquired pneumonia) Active Problems:   Sepsis (HCC)   Liver disease   Anemia   Thrombocytopenia (HCC)  1-Acute Hypoxic respiratory failure secondary to pneumonia: Repeat a chest x-ray this morning showed some pulmonary edema as well. Continue with IV antibiotics ceftriaxone and azithromycin. Stop IV fluids. Initially requiring 4 L , now on room air.  Improved.  Chest x ray improved.   2-Sepsis secondary to pneumonia found to have E. coli bacteremia. Continue with IV ceftriaxone. Continue with IV antibiotics, ceftriaxone.  E coli sensitive to ceftriaxone.  Repeat Blood culture 10-17-2019. No growth to date.  Leukocytosis; WBC up today, chest x ray improved. Continue with current antibiotics.  Will proceed with CT abdomen pelvis.   3-Liver disease coagulopathy, increased bilirubin. Decompensated Liver failure Likely has cirrhosis Ultrasound without significant ascites findings consistent with cirrhosis Ammonia level elevated start lactulose. Vitamin K. GI consulted. Underwent paracentesis 6/17 yielding 1.2 L. Follow fluid results. ANC 194, she will need SBP prophylaxis at discharge. Bactrim.  Started on  spironolactone.  Appreciate GI evaluation.  Sodium down to 129. Wont start lasix. Weight down to 182 from 200 on admission.    4-Alcohol  use disorder: Continue to monitor on CIWA protocol. counseling provided.   5-Hypomagnesemia: Replete magnesium.  6-Hyponatremia: related to cirrhosis. Monitor. Lasix on hold.   7-Hypokalemia: replete orally.   8-Anemia: Folic acid deficiency: Started on supplementation. Hb stable.   9-Hemorrhoid; report blood tissue paper. Repeat hb tonight. Started  hydrocortisone.  Hold lactulose.   Estimated body mass index is 28.6 kg/m as calculated from the following:   Height as of this encounter: 5\' 7"  (1.702 m).   Weight as of this encounter: 82.8 kg.   DVT prophylaxis: SCDs Code Status: Full code Family Communication: Care discussed with patient Disposition Plan:  Status is: Inpatient  Remains inpatient appropriate because:Hemodynamically unstable   Dispo: The patient is from: Home              Anticipated d/c is to: Home              Anticipated d/c date is: 2 days              Patient currently is not medically stable to d/c. awaiting improvement of leukocytosis       Consultants:   None  Procedures:  Right upper quadrant ultrasound:Layering sludge/stones. No definite evidence of acute cholecystitis.  Findings suggestive of cirrhosis  Small amount of perihepatic ascites.    Antimicrobials:  Ceftriaxone and azithromycin  Subjective: Still complaining of pain rectum from hemorrhoids. Blood tissue paper, small amount ion the toilet.   Objective: Vitals:   10/20/19 1632 10/20/19 2048 10/21/19 0733 10/21/19 0811  BP: (!) 99/50 (!) 96/51 107/61 107/61  Pulse: 90 87 92 92  Resp:   16 18  Temp: 98.6 F (37 C) 98.2 F (  36.8 C) 97.9 F (36.6 C) 97.9 F (36.6 C)  TempSrc:    Oral  SpO2: 98% 97% 96% 96%  Weight:      Height:        Intake/Output Summary (Last 24 hours) at 10/21/2019 1328 Last data filed at 10/21/2019 5188 Gross per 24 hour  Intake 510 ml  Output --  Net 510 ml   Filed Weights   10/15/19 0038 10/20/19 1033  Weight: 90.7  kg 82.8 kg    Examination:  General exam; NAD Respiratory system: CTA Cardiovascular system: S 1, S 2 RRR Gastrointestinal system: BS present, soft, nt Central nervous system: alert Extremities: plus 2 edema   Data Reviewed: I have personally reviewed following labs and imaging studies  CBC: Recent Labs  Lab 10/14/19 2357 10/15/19 0520 10/17/19 0925 10/17/19 0925 10/18/19 0335 10/19/19 0635 10/20/19 0358 10/20/19 2055 10/21/19 0824  WBC 17.8*   < > 17.8*  --  17.6* 18.0* 20.5*  --  23.1*  NEUTROABS 14.0*  --   --   --   --   --   --   --   --   HGB 11.1*   < > 11.0*   < > 10.1* 9.8* 9.6* 10.0* 10.7*  HCT 32.4*   < > 30.3*   < > 28.4* 27.2* 26.7* 28.3* 30.6*  MCV 108.7*   < > 103.4*  --  104.0* 104.6* 103.9*  --  106.3*  PLT 123*   < > 142*  --  143* 243 267  --  350   < > = values in this interval not displayed.   Basic Metabolic Panel: Recent Labs  Lab 10/15/19 0520 10/16/19 0500 10/17/19 0925 10/18/19 0335 10/19/19 0635 10/20/19 0358 10/21/19 0824  NA 130*   < > 129* 127* 130* 129* 128*  K 3.0*   < > 2.9* 3.2* 4.4 3.9 4.5  CL 98   < > 96* 97* 99 100 100  CO2 23   < > 23 23 24 22  20*  GLUCOSE 111*   < > 125* 91 86 102* 160*  BUN 23   < > 14 13 12 9 10   CREATININE 1.09*   < > 0.81 0.87 0.74 0.80 0.94  CALCIUM 7.6*   < > 7.7* 7.6* 8.0* 7.9* 8.2*  MG 1.4*  --   --   --   --   --   --    < > = values in this interval not displayed.   GFR: Estimated Creatinine Clearance: 66.9 mL/min (by C-G formula based on SCr of 0.94 mg/dL). Liver Function Tests: Recent Labs  Lab 10/14/19 2357 10/16/19 0500 10/17/19 0925 10/18/19 0335  AST 162* 203* 229* 180*  ALT 41 48* 66* 57*  ALKPHOS 50 58 50 53  BILITOT 4.8* 5.5* 8.0* 8.2*  PROT 6.8 5.9* 6.4* 5.6*  ALBUMIN 2.2* 1.9* 2.0* 1.8*   Recent Labs  Lab 10/14/19 2357  LIPASE 50   Recent Labs  Lab 10/14/19 2357 10/16/19 0617  AMMONIA 76* 22   Coagulation Profile: Recent Labs  Lab 10/14/19 2357  10/15/19 0520 10/16/19 0500 10/20/19 1101  INR 2.3* 2.4* 2.1* 2.2*   Cardiac Enzymes: No results for input(s): CKTOTAL, CKMB, CKMBINDEX, TROPONINI in the last 168 hours. BNP (last 3 results) No results for input(s): PROBNP in the last 8760 hours. HbA1C: No results for input(s): HGBA1C in the last 72 hours. CBG: Recent Labs  Lab 10/15/19 0442 10/19/19 2213 10/20/19 0729 10/20/19 1116  GLUCAP 129* 121* 109* 214*   Lipid Profile: No results for input(s): CHOL, HDL, LDLCALC, TRIG, CHOLHDL, LDLDIRECT in the last 72 hours. Thyroid Function Tests: No results for input(s): TSH, T4TOTAL, FREET4, T3FREE, THYROIDAB in the last 72 hours. Anemia Panel: No results for input(s): VITAMINB12, FOLATE, FERRITIN, TIBC, IRON, RETICCTPCT in the last 72 hours. Sepsis Labs: Recent Labs  Lab 10/14/19 2357 10/15/19 0153 10/15/19 0520  PROCALCITON 3.31  --   --   LATICACIDVEN 4.4* 3.2* 2.4*    Recent Results (from the past 240 hour(s))  Blood Culture (routine x 2)     Status: Abnormal   Collection Time: 10/14/19 11:57 PM   Specimen: BLOOD RIGHT FOREARM  Result Value Ref Range Status   Specimen Description BLOOD RIGHT FOREARM  Final   Special Requests   Final    BOTTLES DRAWN AEROBIC AND ANAEROBIC Blood Culture adequate volume   Culture  Setup Time   Final    GRAM NEGATIVE RODS IN BOTH AEROBIC AND ANAEROBIC BOTTLES CRITICAL RESULT CALLED TO, READ BACK BY AND VERIFIED WITH: Peter Minium PHARMD 1527 10/15/19 A BROWNING    Culture (A)  Final    ESCHERICHIA COLI SUSCEPTIBILITIES PERFORMED ON PREVIOUS CULTURE WITHIN THE LAST 5 DAYS. Performed at [facility], [address], [place], [place] [address]    Report Status 10/17/2019 FINAL  Final  SARS Coronavirus 2 by RT PCR (hospital order, performed in [facility] hospital lab) Nasopharyngeal Nasopharyngeal Swab     Status: None   Collection Time: 10/14/19 11:57 PM   Specimen: Nasopharyngeal Swab  Result Value Ref Range Status   SARS  Coronavirus 2 NEGATIVE NEGATIVE Final    Comment: (NOTE) SARS-CoV-2 target nucleic acids are NOT DETECTED.  The SARS-CoV-2 RNA is generally detectable in upper and lower respiratory specimens during the acute phase of infection. The lowest concentration of SARS-CoV-2 viral copies this assay can detect is 250 copies / mL. A negative result does not preclude SARS-CoV-2 infection and should not be used as the sole basis for treatment or other patient management decisions.  A negative result may occur with improper specimen collection / handling, submission of specimen other than nasopharyngeal swab, presence of viral mutation(s) within the areas targeted by this assay, and inadequate number of viral copies (<250 copies / mL). A negative result must be combined with clinical observations, patient history, and epidemiological information.  Fact Sheet for Patients:   BoilerBrush.com.cy  Fact Sheet for Healthcare Providers: https://pope.com/  This test is not yet approved or  cleared by the [place] FDA and has been authorized for detection and/or diagnosis of SARS-CoV-2 by FDA under an Emergency Use Authorization (EUA).  This EUA will remain in effect (meaning this test can be used) for the duration of the COVID-19 declaration under Section 564(b)(1) of the Act, 21 U.S.C. section 360bbb-3(b)(1), unless the authorization is terminated or revoked sooner.  Performed at [facility], [address] [place], [place] [address]   Blood Culture (routine x 2)     Status: Abnormal   Collection Time: 10/15/19 12:08 AM   Specimen: BLOOD LEFT HAND  Result Value Ref Range Status   Specimen Description BLOOD LEFT HAND  Final   Special Requests   Final    BOTTLES DRAWN AEROBIC AND ANAEROBIC Blood Culture results may not be optimal due to an inadequate volume of blood received in culture bottles   Culture  Setup Time   Final    GRAM  NEGATIVE  RODS IN BOTH AEROBIC AND ANAEROBIC BOTTLES CRITICAL RESULT CALLED TO, READ BACK BY AND VERIFIED WITHPeter Minium: J FRENS [facility]AR[facility] [id number] 10/15/19 A BROWNING Performed at [facility], [address] [place], [place] [address]    Culture ESCHERICHIA COLI (A)  Final   Report Status 10/17/2019 FINAL  Final   Organism ID, Bacteria ESCHERICHIA COLI  Final      Susceptibility   Escherichia coli - MIC*    AMPICILLIN >=32 RESISTANT Resistant     CEFAZOLIN <=4 SENSITIVE Sensitive     CEFEPIME <=0.12 SENSITIVE Sensitive     CEFTAZIDIME <=1 SENSITIVE Sensitive     CEFTRIAXONE <=0.25 SENSITIVE Sensitive     CIPROFLOXACIN <=0.25 SENSITIVE Sensitive     GENTAMICIN <=1 SENSITIVE Sensitive     IMIPENEM <=0.25 SENSITIVE Sensitive     TRIMETH/SULFA <=20 SENSITIVE Sensitive     AMPICILLIN/SULBACTAM 16 INTERMEDIATE Intermediate     PIP/TAZO <=4 SENSITIVE Sensitive     * ESCHERICHIA COLI  Blood Culture ID Panel (Reflexed)     Status: Abnormal   Collection Time: 10/15/19 12:08 AM  Result Value Ref Range Status   Enterococcus species NOT DETECTED NOT DETECTED Final   Listeria monocytogenes NOT DETECTED NOT DETECTED Final   Staphylococcus species NOT DETECTED NOT DETECTED Final   Staphylococcus aureus (BCID) NOT DETECTED NOT DETECTED Final   Streptococcus species NOT DETECTED NOT DETECTED Final   Streptococcus agalactiae NOT DETECTED NOT DETECTED Final   Streptococcus pneumoniae NOT DETECTED NOT DETECTED Final   Streptococcus pyogenes NOT DETECTED NOT DETECTED Final   Acinetobacter baumannii NOT DETECTED NOT DETECTED Final   Enterobacteriaceae species DETECTED (A) NOT DETECTED Final    Comment: Enterobacteriaceae represent a large family of gram-negative bacteria, not a single organism. CRITICAL RESULT CALLED TO, READ BACK BY AND VERIFIED WITH: Peter MiniumJ FRENS PHARMD 1527 10/15/19 A BROWNING    Enterobacter cloacae complex NOT DETECTED NOT DETECTED Final   Escherichia coli DETECTED (A) NOT DETECTED Final     Comment: CRITICAL RESULT CALLED TO, READ BACK BY AND VERIFIED WITH: Peter MiniumJ FRENS PHARMD 1527 10/15/19 A BROWNING    Klebsiella oxytoca NOT DETECTED NOT DETECTED Final   Klebsiella pneumoniae NOT DETECTED NOT DETECTED Final   Proteus species NOT DETECTED NOT DETECTED Final   Serratia marcescens NOT DETECTED NOT DETECTED Final   Carbapenem resistance NOT DETECTED NOT DETECTED Final   Haemophilus influenzae NOT DETECTED NOT DETECTED Final   Neisseria meningitidis NOT DETECTED NOT DETECTED Final   Pseudomonas aeruginosa NOT DETECTED NOT DETECTED Final   Candida albicans NOT DETECTED NOT DETECTED Final   Candida glabrata NOT DETECTED NOT DETECTED Final   Candida krusei NOT DETECTED NOT DETECTED Final   Candida parapsilosis NOT DETECTED NOT DETECTED Final   Candida tropicalis NOT DETECTED NOT DETECTED Final    Comment: Performed at [facility], [address] [place], [place] [address]  Gram stain     Status: None   Collection Time: 10/16/19  2:50 PM   Specimen: Abdomen; Peritoneal Fluid  Result Value Ref Range Status   Specimen Description FLUID PERITONEAL ABDOMEN  Final   Special Requests NONE  Final   Gram Stain   Final    WBC PRESENT,BOTH PMN AND MONONUCLEAR NO ORGANISMS SEEN CYTOSPIN SMEAR Performed at [facility], [address] [place], [place] [address]    Report Status 10/16/2019 FINAL  Final  Culture, body fluid-bottle     Status: None   Collection Time: 10/16/19  2:50 PM  Specimen: Fluid  Result Value Ref Range Status   Specimen Description FLUID PERITONEAL ABDOMEN  Final   Special Requests BOTTLES DRAWN AEROBIC AND ANAEROBIC  Final   Culture   Final    NO GROWTH 5 DAYS Performed at [facility], [address], [place], [place] [address]    Report Status 10/21/2019 FINAL  Final  Culture, blood (routine x 2)     Status: None (Preliminary result)   Collection Time: 10/17/19  9:22 AM   Specimen: BLOOD  Result Value Ref Range Status   Specimen  Description BLOOD RIGHT ANTECUBITAL  Final   Special Requests   Final    BOTTLES DRAWN AEROBIC AND ANAEROBIC Blood Culture adequate volume   Culture   Final    NO GROWTH 4 DAYS Performed at [facility], [address], [place], [place] [address]    Report Status PENDING  Incomplete  Culture, blood (routine x 2)     Status: None (Preliminary result)   Collection Time: 10/17/19  9:27 AM   Specimen: BLOOD LEFT HAND  Result Value Ref Range Status   Specimen Description BLOOD LEFT HAND  Final   Special Requests   Final    BOTTLES DRAWN AEROBIC ONLY Blood Culture adequate volume   Culture   Final    NO GROWTH 4 DAYS Performed at [facility], [address] [place], [place] [address]    Report Status PENDING  Incomplete  Urine culture     Status: None   Collection Time: 10/17/19 10:00 AM   Specimen: In/Out Cath Urine  Result Value Ref Range Status   Specimen Description IN/OUT CATH URINE  Final   Special Requests NONE  Final   Culture   Final    NO GROWTH Performed at [facility], [address] [place], [place] [address]    Report Status 10/18/2019 FINAL  Final         Radiology Studies: DG Chest 2 View  Result Date: 10/20/2019 CLINICAL DATA:  Leukocytosis. EXAM: CHEST - 2 VIEW COMPARISON:  10/15/2019 FINDINGS: Significant improvement in bilateral airspace disease. Mild residual bibasilar atelectasis or infiltrate. Small left effusion. Negative for vascular congestion or edema. IMPRESSION: Significant improvement in bilateral airspace disease. Possible clearing of edema or pneumonia. Electronically Signed   By: Marlan Palau M.D.   On: 10/20/2019 10:04        Scheduled Meds: . folic acid  1 mg Oral Daily  . hydrocortisone cream   Topical BID  . multivitamin with minerals  1 tablet Oral Daily  . nicotine  14 mg Transdermal Daily  . spironolactone  200 mg Oral Daily  . thiamine  100 mg Oral Daily   Continuous Infusions: . cefTRIAXone (ROCEPHIN)   IV 2 g (10/21/19 0829)     LOS: 6 days    Time spent: 35 minutes    Datrell Dunton A Sonnet Rizor, MD [facility]   If 7PM-7AM, please contact night-coverage [email or web address]  10/21/2019, 1:28 PM

## 2019-10-21 NOTE — Plan of Care (Signed)
  Problem: Clinical Measurements: Goal: Will remain free from infection Outcome: Progressing Goal: Respiratory complications will improve Outcome: Progressing Goal: Cardiovascular complication will be avoided Outcome: Progressing   Problem: Activity: Goal: Risk for activity intolerance will decrease Outcome: Progressing   Problem: Nutrition: Goal: Adequate nutrition will be maintained Outcome: Progressing   Problem: Coping: Goal: Level of anxiety will decrease Outcome: Progressing   Problem: Elimination: Goal: Will not experience complications related to bowel motility Outcome: Progressing Goal: Will not experience complications related to urinary retention Outcome: Progressing   Problem: Pain Managment: Goal: General experience of comfort will improve Outcome: Progressing   Problem: Skin Integrity: Goal: Risk for impaired skin integrity will decrease Outcome: Progressing

## 2019-10-22 DIAGNOSIS — B9620 Unspecified Escherichia coli [E. coli] as the cause of diseases classified elsewhere: Secondary | ICD-10-CM

## 2019-10-22 DIAGNOSIS — K7031 Alcoholic cirrhosis of liver with ascites: Secondary | ICD-10-CM

## 2019-10-22 DIAGNOSIS — K769 Liver disease, unspecified: Secondary | ICD-10-CM

## 2019-10-22 DIAGNOSIS — N10 Acute tubulo-interstitial nephritis: Secondary | ICD-10-CM

## 2019-10-22 DIAGNOSIS — D539 Nutritional anemia, unspecified: Secondary | ICD-10-CM

## 2019-10-22 DIAGNOSIS — F10129 Alcohol abuse with intoxication, unspecified: Secondary | ICD-10-CM

## 2019-10-22 DIAGNOSIS — F17200 Nicotine dependence, unspecified, uncomplicated: Secondary | ICD-10-CM

## 2019-10-22 DIAGNOSIS — R7881 Bacteremia: Secondary | ICD-10-CM

## 2019-10-22 LAB — CULTURE, BLOOD (ROUTINE X 2)
Culture: NO GROWTH
Culture: NO GROWTH
Special Requests: ADEQUATE
Special Requests: ADEQUATE

## 2019-10-22 LAB — BASIC METABOLIC PANEL
Anion gap: 7 (ref 5–15)
BUN: 8 mg/dL (ref 8–23)
CO2: 20 mmol/L — ABNORMAL LOW (ref 22–32)
Calcium: 8.1 mg/dL — ABNORMAL LOW (ref 8.9–10.3)
Chloride: 103 mmol/L (ref 98–111)
Creatinine, Ser: 0.84 mg/dL (ref 0.44–1.00)
GFR calc Af Amer: >60 mL/min (ref >60)
GFR calc non Af Amer: >60 mL/min (ref >60)
Glucose, Bld: 103 mg/dL — ABNORMAL HIGH (ref 70–99)
Potassium: 4.6 mmol/L (ref 3.5–5.1)
Sodium: 130 mmol/L — ABNORMAL LOW (ref 135–145)

## 2019-10-22 LAB — CBC
HCT: 27.4 % — ABNORMAL LOW (ref 36.0–46.0)
Hemoglobin: 9.6 g/dL — ABNORMAL LOW (ref 12.0–15.0)
MCH: 37.2 pg — ABNORMAL HIGH (ref 26.0–34.0)
MCHC: 35 g/dL (ref 30.0–36.0)
MCV: 106.2 fL — ABNORMAL HIGH (ref 80.0–100.0)
Platelets: 320 10*3/uL (ref 150–400)
RBC: 2.58 MIL/uL — ABNORMAL LOW (ref 3.87–5.11)
RDW: 17.5 % — ABNORMAL HIGH (ref 11.5–15.5)
WBC: 19.1 10*3/uL — ABNORMAL HIGH (ref 4.0–10.5)
nRBC: 0 % (ref 0.0–0.2)

## 2019-10-22 MED ORDER — LIP MEDEX EX OINT
TOPICAL_OINTMENT | CUTANEOUS | Status: DC | PRN
  Filled 2019-10-22: qty 7

## 2019-10-22 MED ORDER — SPIRONOLACTONE 25 MG PO TABS
100.0000 mg | ORAL_TABLET | Freq: Every day | ORAL | Status: DC
  Administered 2019-10-23: 100 mg via ORAL
  Filled 2019-10-22: qty 4

## 2019-10-22 MED ORDER — FUROSEMIDE 40 MG PO TABS
40.0000 mg | ORAL_TABLET | Freq: Every day | ORAL | Status: DC
  Administered 2019-10-23: 40 mg via ORAL
  Filled 2019-10-22: qty 1

## 2019-10-22 NOTE — Progress Notes (Signed)
PROGRESS NOTE  Shirley Mays JIR:[id number] DOB: 30-May-1955   PCP: Patient, No Pcp Per  Patient is from: Home  DOA: 10/14/2019 LOS: 7  Brief Narrative / Interim history: 64 year old female with history of EtOH abuse with fever and lethargy and admitted for sepsis and acute respiratory failure with hypoxia due to community-acquired pneumonia and E. coli bacteremia.  Also found to have liver cirrhosis with ascites likely due to alcohol.  She was started on IV ceftriaxone.  She had a paracentesis on 6/17 with removal of 1.2 L with fluid study not suggestive for SBP.  She continues to be have leukocytosis despite course of IV ceftriaxone since 10/14/2019.  CT abdomen and pelvis on 6/22 concerning for bilateral pyelonephritis with drainable fluid collection or abscess, and cirrhosis with small ascites and anasarca.  Subjective: Seen and examined earlier this morning.  No major events overnight of this morning.  No complaints.  She denies chest pain, dyspnea, GI or UTI symptoms.  Objective: Vitals:   10/22/19 0430 10/22/19 0500 10/22/19 0758 10/22/19 1021  BP:   110/72 (!) 92/50  Pulse: 92  96 94  Resp:   16   Temp:   98 F (36.7 C)   TempSrc:      SpO2:   97%   Weight:  86.9 kg    Height:        Intake/Output Summary (Last 24 hours) at 10/22/2019 1416 Last data filed at 10/22/2019 1000 Gross per 24 hour  Intake 200 ml  Output --  Net 200 ml   Filed Weights   10/15/19 0038 10/20/19 1033 10/22/19 0500  Weight: 90.7 kg 82.8 kg 86.9 kg    Examination:  GENERAL: No apparent distress.  Nontoxic. HEENT: MMM.  Vision and hearing grossly intact.  NECK: Supple.  No apparent JVD.  RESP:  No IWOB.  Fair aeration bilaterally. CVS:  RRR. Heart sounds normal.  ABD/GI/GU: BS+. Abd soft, NTND.  No CVA tenderness MSK/EXT:  Moves extremities. No apparent deformity.  2+ BLE edema SKIN: no apparent skin lesion or wound NEURO: Awake, alert and oriented appropriately.  No apparent focal neuro  deficit. PSYCH: Calm. Normal affect.   Procedures:  6/17-paracentesis with removal of 1.2 L  Microbiology summarized: 6/15-COVID-19 PCR negative. 6/16-blood culture with E. coli resistant to ampicillin and Unasyn. 6/17-peritoneal fluid culture NGTD. 6/18-urine culture NGTD. 6/23-urine culture pending  Assessment & Plan: Acute respiratory failure with hypoxia due to community-acquired pneumonia-resolved.  On room air. -Completed course with IV ceftriaxone and azithromycin -Encourage incentive spirometry  Sepsis due to community-acquired pneumonia, E. coli bacteremia and possible acute pyelonephritis: on IV ceftriaxone since 6/15 but with persistent leukocytosis.  CT abdomen and pelvis on 6/22 concerning for acute pyelonephritis.  Her urine culture was negative. -Continue IV ceftriaxone for now. Will discuss with ID about duration -Repeat urine culture  Liver cirrhosis with ascites/coagulopathy/hyperbilirubinemia/elevated ammonia-likely due to alcohol abuse. -GI signed off. -Paracentesis with removal of 1.2 L.  Follow culture negative.  No signs of SBP. -Add p.o. Lasix 40 mg daily.  Decrease Aldactone to 100 mg daily -SBP PPx-we will avoid Bactrim given hyponatremia which could get worse with concurrent use of Aldactone.  Alcohol abuse -Discontinue CIWA-should be outside withdrawal window now. -Continue folic acid and multivitamins  Hypomagnesemia/hyponatremia/hypokalemia-likely due to alcohol -Monitor and replace as appropriate  Microcytic anemia: Likely due to liver cirrhosis from alcohol.  H&H stable. -Replenish folic acid  Hemorrhoid -Anuso  Persistent leukocytosis: WBC 18 (admit)> 23 (peak)>> 19 -Check differential.  BLE  edema-likely due to cirrhosis and hypoalbuminemia. -Check UPC  Tobacco use disorder -Nicotine patch  Body mass index is 30.01 kg/m.         DVT prophylaxis:  Place TED hose Start: 10/19/19 1330 Place TED hose Start: 10/18/19 0903 SCDs  Start: 10/15/19 0252  Code Status: Full code Family Communication: Patient and/or RN. Available if any question. Status is: Inpatient  Remains inpatient appropriate because:IV treatments appropriate due to intensity of illness or inability to take PO and Inpatient level of care appropriate due to severity of illness   Dispo: The patient is from: Home              Anticipated d/c is to: Home              Anticipated d/c date is: 2 days              Patient currently is not medically stable to d/c.       Consultants:  GI   Sch Meds:  Scheduled Meds: . folic acid  1 mg Oral Daily  . hydrocortisone cream   Topical BID  . multivitamin with minerals  1 tablet Oral Daily  . nicotine  14 mg Transdermal Daily  . saccharomyces boulardii  250 mg Oral BID  . spironolactone  200 mg Oral Daily  . thiamine  100 mg Oral Daily   Continuous Infusions: . cefTRIAXone (ROCEPHIN)  IV 2 g (10/22/19 0840)   PRN Meds:.acetaminophen **OR** acetaminophen, guaiFENesin-dextromethorphan, lidocaine, ondansetron (ZOFRAN) IV, sodium chloride flush, witch hazel-glycerin  Antimicrobials: Anti-infectives (From admission, onward)   Start     Dose/Rate Route Frequency Ordered Stop   10/20/19 1100  ceFAZolin (ANCEF) IVPB 2g/100 mL premix  Status:  Discontinued        2 g 200 mL/hr over 30 Minutes Intravenous Every 8 hours 10/20/19 1028 10/20/19 1045   10/20/19 1100  cefTRIAXone (ROCEPHIN) 2 g in sodium chloride 0.9 % 100 mL IVPB     Discontinue     2 g 200 mL/hr over 30 Minutes Intravenous Every 24 hours 10/20/19 1045     10/16/19 0200  vancomycin (VANCOREADY) IVPB 1250 mg/250 mL  Status:  Discontinued        1,250 mg 166.7 mL/hr over 90 Minutes Intravenous Every 24 hours 10/15/19 0127 10/15/19 0251   10/16/19 0000  cefTRIAXone (ROCEPHIN) 1 g in sodium chloride 0.9 % 100 mL IVPB  Status:  Discontinued        1 g 200 mL/hr over 30 Minutes Intravenous Every 24 hours 10/15/19 0251 10/15/19 1533   10/15/19  1600  cefTRIAXone (ROCEPHIN) 2 g in sodium chloride 0.9 % 100 mL IVPB  Status:  Discontinued        2 g 200 mL/hr over 30 Minutes Intravenous Every 24 hours 10/15/19 1533 10/20/19 1028   10/15/19 1200  ceFEPIme (MAXIPIME) 2 g in sodium chloride 0.9 % 100 mL IVPB  Status:  Discontinued        2 g 200 mL/hr over 30 Minutes Intravenous Every 12 hours 10/15/19 0127 10/15/19 0251   10/15/19 0600  azithromycin (ZITHROMAX) 500 mg in sodium chloride 0.9 % 250 mL IVPB  Status:  Discontinued        500 mg 250 mL/hr over 60 Minutes Intravenous Every 24 hours 10/15/19 0251 10/19/19 0739   10/14/19 2345  ceFEPIme (MAXIPIME) 2 g in sodium chloride 0.9 % 100 mL IVPB        2 g 200 mL/hr over 30  Minutes Intravenous  Once 10/14/19 2343 10/15/19 0116   10/14/19 2345  metroNIDAZOLE (FLAGYL) IVPB 500 mg        500 mg 100 mL/hr over 60 Minutes Intravenous  Once 10/14/19 2343 10/15/19 0243   10/14/19 2345  vancomycin (VANCOCIN) IVPB 1000 mg/200 mL premix        1,000 mg 200 mL/hr over 60 Minutes Intravenous  Once 10/14/19 2343 10/15/19 0329       I have personally reviewed the following labs and images: CBC: Recent Labs  Lab 10/18/19 0335 10/18/19 0335 10/19/19 0635 10/20/19 0358 10/20/19 2055 10/21/19 0824 10/22/19 0340  WBC 17.6*  --  18.0* 20.5*  --  23.1* 19.1*  HGB 10.1*   < > 9.8* 9.6* 10.0* 10.7* 9.6*  HCT 28.4*   < > 27.2* 26.7* 28.3* 30.6* 27.4*  MCV 104.0*  --  104.6* 103.9*  --  106.3* 106.2*  PLT 143*  --  243 267  --  350 320   < > = values in this interval not displayed.   BMP &GFR Recent Labs  Lab 10/18/19 0335 10/19/19 0635 10/20/19 0358 10/21/19 0824 10/22/19 0340  NA 127* 130* 129* 128* 130*  K 3.2* 4.4 3.9 4.5 4.6  CL 97* 99 100 100 103  CO2 23 24 22  20* 20*  GLUCOSE 91 86 102* 160* 103*  BUN 13 12 9 10 8   CREATININE 0.87 0.74 0.80 0.94 0.84  CALCIUM 7.6* 8.0* 7.9* 8.2* 8.1*   Estimated Creatinine Clearance: 76.6 mL/min (by C-G formula based on SCr of 0.84  mg/dL). Liver & Pancreas: Recent Labs  Lab 10/16/19 0500 10/17/19 0925 10/18/19 0335  AST 203* 229* 180*  ALT 48* 66* 57*  ALKPHOS 58 50 53  BILITOT 5.5* 8.0* 8.2*  PROT 5.9* 6.4* 5.6*  ALBUMIN 1.9* 2.0* 1.8*   No results for input(s): LIPASE, AMYLASE in the last 168 hours. Recent Labs  Lab 10/16/19 0617  AMMONIA 22   Diabetic: No results for input(s): HGBA1C in the last 72 hours. Recent Labs  Lab 10/19/19 2213 10/20/19 0729 10/20/19 1116  GLUCAP 121* 109* 214*   Cardiac Enzymes: No results for input(s): CKTOTAL, CKMB, CKMBINDEX, TROPONINI in the last 168 hours. No results for input(s): PROBNP in the last 8760 hours. Coagulation Profile: Recent Labs  Lab 10/16/19 0500 10/20/19 1101  INR 2.1* 2.2*   Thyroid Function Tests: No results for input(s): TSH, T4TOTAL, FREET4, T3FREE, THYROIDAB in the last 72 hours. Lipid Profile: No results for input(s): CHOL, HDL, LDLCALC, TRIG, CHOLHDL, LDLDIRECT in the last 72 hours. Anemia Panel: No results for input(s): VITAMINB12, FOLATE, FERRITIN, TIBC, IRON, RETICCTPCT in the last 72 hours. Urine analysis: No results found for: COLORURINE, APPEARANCEUR, LABSPEC, PHURINE, GLUCOSEU, HGBUR, BILIRUBINUR, KETONESUR, PROTEINUR, UROBILINOGEN, NITRITE, LEUKOCYTESUR Sepsis Labs: Invalid input(s): PROCALCITONIN, LACTICIDVEN  Microbiology: Recent Results (from the past 240 hour(s))  Blood Culture (routine x 2)     Status: Abnormal   Collection Time: 10/14/19 11:57 PM   Specimen: BLOOD RIGHT FOREARM  Result Value Ref Range Status   Specimen Description BLOOD RIGHT FOREARM  Final   Special Requests   Final    BOTTLES DRAWN AEROBIC AND ANAEROBIC Blood Culture adequate volume   Culture  Setup Time   Final    GRAM NEGATIVE RODS IN BOTH AEROBIC AND ANAEROBIC BOTTLES CRITICAL RESULT CALLED TO, READ BACK BY AND VERIFIED WITHPeter Minium: J FRENS PHARMD 1527 10/15/19 A BROWNING    Culture (A)  Final    ESCHERICHIA COLI  SUSCEPTIBILITIES PERFORMED ON  PREVIOUS CULTURE WITHIN THE LAST 5 DAYS. Performed at [facility], [address], [place], [place] [address]    Report Status 10/17/2019 FINAL  Final  SARS Coronavirus 2 by RT PCR (hospital order, performed in [facility] hospital lab) Nasopharyngeal Nasopharyngeal Swab     Status: None   Collection Time: 10/14/19 11:57 PM   Specimen: Nasopharyngeal Swab  Result Value Ref Range Status   SARS Coronavirus 2 NEGATIVE NEGATIVE Final    Comment: (NOTE) SARS-CoV-2 target nucleic acids are NOT DETECTED.  The SARS-CoV-2 RNA is generally detectable in upper and lower respiratory specimens during the acute phase of infection. The lowest concentration of SARS-CoV-2 viral copies this assay can detect is 250 copies / mL. A negative result does not preclude SARS-CoV-2 infection and should not be used as the sole basis for treatment or other patient management decisions.  A negative result may occur with improper specimen collection / handling, submission of specimen other than nasopharyngeal swab, presence of viral mutation(s) within the areas targeted by this assay, and inadequate number of viral copies (<250 copies / mL). A negative result must be combined with clinical observations, patient history, and epidemiological information.  Fact Sheet for Patients:   BoilerBrush.com.cy  Fact Sheet for Healthcare Providers: https://pope.com/  This test is not yet approved or  cleared by the [place] FDA and has been authorized for detection and/or diagnosis of SARS-CoV-2 by FDA under an Emergency Use Authorization (EUA).  This EUA will remain in effect (meaning this test can be used) for the duration of the COVID-19 declaration under Section 564(b)(1) of the Act, 21 U.S.C. section 360bbb-3(b)(1), unless the authorization is terminated or revoked sooner.  Performed at [facility], [address], [place], [place] [address]   Blood  Culture (routine x 2)     Status: Abnormal   Collection Time: 10/15/19 12:08 AM   Specimen: BLOOD LEFT HAND  Result Value Ref Range Status   Specimen Description BLOOD LEFT HAND  Final   Special Requests   Final    BOTTLES DRAWN AEROBIC AND ANAEROBIC Blood Culture results may not be optimal due to an inadequate volume of blood received in culture bottles   Culture  Setup Time   Final    GRAM NEGATIVE RODS IN BOTH AEROBIC AND ANAEROBIC BOTTLES CRITICAL RESULT CALLED TO, READ BACK BY AND VERIFIED WITHPeter Minium PHARMD 2426 10/15/19 A BROWNING Performed at [facility], [address] [place], [place] [address]    Culture ESCHERICHIA COLI (A)  Final   Report Status 10/17/2019 FINAL  Final   Organism ID, Bacteria ESCHERICHIA COLI  Final      Susceptibility   Escherichia coli - MIC*    AMPICILLIN >=32 RESISTANT Resistant     CEFAZOLIN <=4 SENSITIVE Sensitive     CEFEPIME <=0.12 SENSITIVE Sensitive     CEFTAZIDIME <=1 SENSITIVE Sensitive     CEFTRIAXONE <=0.25 SENSITIVE Sensitive     CIPROFLOXACIN <=0.25 SENSITIVE Sensitive     GENTAMICIN <=1 SENSITIVE Sensitive     IMIPENEM <=0.25 SENSITIVE Sensitive     TRIMETH/SULFA <=20 SENSITIVE Sensitive     AMPICILLIN/SULBACTAM 16 INTERMEDIATE Intermediate     PIP/TAZO <=4 SENSITIVE Sensitive     * ESCHERICHIA COLI  Blood Culture ID Panel (Reflexed)     Status: Abnormal   Collection Time: 10/15/19 12:08 AM  Result Value Ref Range Status   Enterococcus species NOT DETECTED NOT DETECTED Final  Listeria monocytogenes NOT DETECTED NOT DETECTED Final   Staphylococcus species NOT DETECTED NOT DETECTED Final   Staphylococcus aureus (BCID) NOT DETECTED NOT DETECTED Final   Streptococcus species NOT DETECTED NOT DETECTED Final   Streptococcus agalactiae NOT DETECTED NOT DETECTED Final   Streptococcus pneumoniae NOT DETECTED NOT DETECTED Final   Streptococcus pyogenes NOT DETECTED NOT DETECTED Final   Acinetobacter baumannii NOT DETECTED NOT  DETECTED Final   Enterobacteriaceae species DETECTED (A) NOT DETECTED Final    Comment: Enterobacteriaceae represent a large family of gram-negative bacteria, not a single organism. CRITICAL RESULT CALLED TO, READ BACK BY AND VERIFIED WITH: Peter Minium PHARMD 1527 10/15/19 A BROWNING    Enterobacter cloacae complex NOT DETECTED NOT DETECTED Final   Escherichia coli DETECTED (A) NOT DETECTED Final    Comment: CRITICAL RESULT CALLED TO, READ BACK BY AND VERIFIED WITH: Peter Minium PHARMD 1527 10/15/19 A BROWNING    Klebsiella oxytoca NOT DETECTED NOT DETECTED Final   Klebsiella pneumoniae NOT DETECTED NOT DETECTED Final   Proteus species NOT DETECTED NOT DETECTED Final   Serratia marcescens NOT DETECTED NOT DETECTED Final   Carbapenem resistance NOT DETECTED NOT DETECTED Final   Haemophilus influenzae NOT DETECTED NOT DETECTED Final   Neisseria meningitidis NOT DETECTED NOT DETECTED Final   Pseudomonas aeruginosa NOT DETECTED NOT DETECTED Final   Candida albicans NOT DETECTED NOT DETECTED Final   Candida glabrata NOT DETECTED NOT DETECTED Final   Candida krusei NOT DETECTED NOT DETECTED Final   Candida parapsilosis NOT DETECTED NOT DETECTED Final   Candida tropicalis NOT DETECTED NOT DETECTED Final    Comment: Performed at [facility], [address], [place], [place] [address]  Gram stain     Status: None   Collection Time: 10/16/19  2:50 PM   Specimen: Abdomen; Peritoneal Fluid  Result Value Ref Range Status   Specimen Description FLUID PERITONEAL ABDOMEN  Final   Special Requests NONE  Final   Gram Stain   Final    WBC PRESENT,BOTH PMN AND MONONUCLEAR NO ORGANISMS SEEN CYTOSPIN SMEAR Performed at [facility], [address] [place], [place] [address]    Report Status 10/16/2019 FINAL  Final  Culture, body fluid-bottle     Status: None   Collection Time: 10/16/19  2:50 PM   Specimen: Fluid  Result Value Ref Range Status   Specimen Description FLUID PERITONEAL ABDOMEN   Final   Special Requests BOTTLES DRAWN AEROBIC AND ANAEROBIC  Final   Culture   Final    NO GROWTH 5 DAYS Performed at [facility], [address] [place], [place] [address]    Report Status 10/21/2019 FINAL  Final  Culture, blood (routine x 2)     Status: None   Collection Time: 10/17/19  9:22 AM   Specimen: BLOOD  Result Value Ref Range Status   Specimen Description BLOOD RIGHT ANTECUBITAL  Final   Special Requests   Final    BOTTLES DRAWN AEROBIC AND ANAEROBIC Blood Culture adequate volume   Culture   Final    NO GROWTH 5 DAYS Performed at [facility], [address], [place], [place] [address]    Report Status 10/22/2019 FINAL  Final  Culture, blood (routine x 2)     Status: None   Collection Time: 10/17/19  9:27 AM   Specimen: BLOOD LEFT HAND  Result Value Ref Range Status   Specimen Description BLOOD LEFT HAND  Final   Special Requests  Final    BOTTLES DRAWN AEROBIC ONLY Blood Culture adequate volume   Culture   Final    NO GROWTH 5 DAYS Performed at [facility], [address] [place], [place] [address]    Report Status 10/22/2019 FINAL  Final  Urine culture     Status: None   Collection Time: 10/17/19 10:00 AM   Specimen: In/Out Cath Urine  Result Value Ref Range Status   Specimen Description IN/OUT CATH URINE  Final   Special Requests NONE  Final   Culture   Final    NO GROWTH Performed at [facility], [address] [place], [place] [address]    Report Status 10/18/2019 FINAL  Final    Radiology Studies: CT ABDOMEN PELVIS W CONTRAST  Result Date: 10/21/2019 CLINICAL DATA:  64 year old female with with abdominal distension and leukocytosis. EXAM: CT ABDOMEN AND PELVIS WITH CONTRAST TECHNIQUE: Multidetector CT imaging of the abdomen and pelvis was performed using the standard protocol following bolus administration of intravenous contrast. CONTRAST:  OMNIPAQUE IOHEXOL 300 MG/ML  SOLN COMPARISON:  Right upper quadrant  ultrasound dated 10/15/2019. FINDINGS: Lower chest: Partially visualized small left pleural effusion. There is patchy area of consolidative changes of the left lower lobe with air bronchogram which may represent atelectasis or pneumonia. Clinical correlation is recommended. No intra-abdominal free air. Small ascites. Hepatobiliary: Cirrhosis. No intrahepatic biliary ductal dilatation. There is small amount of sludge within the gallbladder. No calcified gallstone. Pancreas: Unremarkable. No pancreatic ductal dilatation or surrounding inflammatory changes. Spleen: Indeterminate 2 cm splenic hypodense lesion the the Adrenals/Urinary Tract: The adrenal glands are unremarkable. There is no hydronephrosis on either side. There is heterogeneous enhancement of the renal parenchyma most consistent with pyelonephritis. Correlation with urinalysis recommended. No drainable fluid collection or abscess. The visualized ureters and urinary bladder appear unremarkable. Stomach/Bowel: There is sigmoid diverticulosis. There is a small hiatal hernia. There is no bowel obstruction. The appendix is normal. Vascular/Lymphatic: Mild aortoiliac atherosclerotic disease. The IVC is unremarkable. No portal venous gas. There is no adenopathy. Reproductive: The uterus and ovaries are grossly unremarkable. Other: Diffuse subcutaneous edema and anasarca. Musculoskeletal: No acute or significant osseous findings. IMPRESSION: 1. Findings most consistent with bilateral pyelonephritis. Correlation with urinalysis recommended. No drainable fluid collection or abscess. 2. Cirrhosis with small ascites and anasarca. 3. Sigmoid diverticulosis. No bowel obstruction. Normal appendix. 4. Aortic Atherosclerosis (ICD10-I70.0). Electronically Signed   By: Elgie Collard M.D.   On: 10/21/2019 21:57      Peyton Rossner T. Marcayla Budge [facility]  If 7PM-7AM, please contact night-coverage [email or web address] Password [facility] 10/22/2019, 2:16 PM

## 2019-10-22 NOTE — TOC CAGE-AID Note (Deleted)
Transition of Care [facility]) - CAGE-AID Screening   Patient Details  Name: Shirley Mays MRN: [id number] Date of Birth: April 28, 1956  Transition of [facility]) CM/SW Contact:    Beckie Busing, RN Phone Number: 10/22/2019, 2:27 PM   Clinical Narrative:  Patient states that she will not be drinking anymore. Only with friends occasionally. Patient expresses interest in stopping alcholol use. Substance abuse resources given and explained.      CAGE-AID Screening:

## 2019-10-22 NOTE — TOC CAGE-AID Note (Signed)
Transition of Care [facility]) - CAGE-AID Screening   Patient Details  Name: Shirley Mays MRN: [id number] Date of Birth: 03-14-1956  Transition of Care [facility]) CM/SW Contact:    Beckie Busing, RN Phone Number: 10/22/2019, 2:44 PM   Clinical Narrative: Patient states that she will not be drinking alone anymore. Only with friends occasionally. Patient expresses interest in stopping alcholol use. Substance/ ETOH abuse resources given and explained.  Patient verbalized understanding.   CAGE-AID Screening:    Have You Ever Felt You Ought to Cut Down on Your Drinking or Drug Use?: No Have People Annoyed You By Critizing Your Drinking Or Drug Use?: No Have You Felt Bad Or Guilty About Your Drinking Or Drug Use?: No Have You Ever Had a Drink or Used Drugs First Thing In The Morning to STeady Your Nerves or to Get Rid of a Hangover?: No CAGE-AID Score: 0  Substance Abuse Education Offered: Yes  Substance abuse interventions: Transport planner

## 2019-10-22 NOTE — TOC Initial Note (Signed)
Transition of Care [facility]) - Initial/Assessment Note    Patient Details  Name: Shirley Mays MRN: [id number] Date of Birth: [birth year]  Transition of Care [facility]) CM/SW Contact:    Beckie Busing, RN Phone Number: [phone]  10/22/2019, 2:51 PM  Clinical Narrative:   CM consulted to provide substance abuse resources and set up with PCP.  Resources given to patient for substance abuse. Patient is agreeable to be set up with [facility]. Appointment set up and will be added to after visit summary. Patient made aware that appointment will be on discharge paperwork. MATCH form completed.            Expected Discharge Plan: Home/Self Care Barriers to Discharge: Continued Medical Work up   Patient Goals and CMS Choice Patient states their goals for this hospitalization and ongoing recovery are:: Wants to go home   Choice offered to / list presented to : NA  Expected Discharge Plan and Services Expected Discharge Plan: Home/Self Care In-house Referral: NA   Post Acute Care Choice: NA Living arrangements for the past 2 months: Single Family Home                 DME Arranged: N/A DME Agency: NA       HH Arranged: NA HH Agency: NA        Prior Living Arrangements/Services Living arrangements for the past 2 months: Single Family Home Lives with:: Self Patient language and need for interpreter reviewed:: Yes Do you feel safe going back to the place where you live?: Yes      Need for Family Participation in Patient Care: Yes (Comment) Care giver support system in place?: Yes (comment)   Criminal Activity/Legal Involvement Pertinent to Current Situation/Hospitalization: No - Comment as needed  Activities of Daily Living      Permission Sought/Granted   Permission granted to share information with : No              Emotional Assessment Appearance:: Appears younger than stated age Attitude/Demeanor/Rapport: Gracious Affect (typically observed): Accepting,  Pleasant Orientation: : Oriented to Self, Oriented to Place, Oriented to  Time, Oriented to Situation Alcohol / Substance Use: Alcohol Use Psych Involvement: No (comment)  Admission diagnosis:  Hypokalemia [E87.6] Lactic acidosis [E87.2] Hyponatremia [E87.1] Elevated INR [R79.1] Elevated LFTs [R79.89] CAP (community acquired pneumonia) [J18.9] AKI (acute kidney injury) (HCC) [N17.9] Elevated bilirubin [R17] Sepsis with acute hypoxic respiratory failure without septic shock, due to unspecified organism (HCC) [A41.9, R65.20, J96.01] Patient Active Problem List   Diagnosis Date Noted  . CAP (community acquired pneumonia) 10/15/2019  . Sepsis (HCC) 10/15/2019  . Liver disease 10/15/2019  . Anemia 10/15/2019  . Thrombocytopenia (HCC) 10/15/2019   PCP:  Patient, No Pcp Per Pharmacy:   [id number] - [place], [place] - [address] AT [facility] [address] [place] [address] Phone: [phone] Fax: [phone]     Social Determinants of Health (SDOH) Interventions    Readmission Risk Interventions No flowsheet data found.

## 2019-10-23 DIAGNOSIS — D696 Thrombocytopenia, unspecified: Secondary | ICD-10-CM

## 2019-10-23 DIAGNOSIS — D689 Coagulation defect, unspecified: Secondary | ICD-10-CM

## 2019-10-23 DIAGNOSIS — D72825 Bandemia: Secondary | ICD-10-CM

## 2019-10-23 LAB — CBC WITH DIFFERENTIAL/PLATELET
Abs Immature Granulocytes: 0.42 10*3/uL — ABNORMAL HIGH (ref 0.00–0.07)
Basophils Absolute: 0.2 10*3/uL — ABNORMAL HIGH (ref 0.0–0.1)
Basophils Relative: 1 %
Eosinophils Absolute: 0.2 10*3/uL (ref 0.0–0.5)
Eosinophils Relative: 1 %
HCT: 29.1 % — ABNORMAL LOW (ref 36.0–46.0)
Hemoglobin: 10.2 g/dL — ABNORMAL LOW (ref 12.0–15.0)
Immature Granulocytes: 2 %
Lymphocytes Relative: 15 %
Lymphs Abs: 3 10*3/uL (ref 0.7–4.0)
MCH: 37.4 pg — ABNORMAL HIGH (ref 26.0–34.0)
MCHC: 35.1 g/dL (ref 30.0–36.0)
MCV: 106.6 fL — ABNORMAL HIGH (ref 80.0–100.0)
Monocytes Absolute: 1.3 10*3/uL — ABNORMAL HIGH (ref 0.1–1.0)
Monocytes Relative: 7 %
Neutro Abs: 14.5 10*3/uL — ABNORMAL HIGH (ref 1.7–7.7)
Neutrophils Relative %: 74 %
Platelets: 339 10*3/uL (ref 150–400)
RBC: 2.73 MIL/uL — ABNORMAL LOW (ref 3.87–5.11)
RDW: 17.7 % — ABNORMAL HIGH (ref 11.5–15.5)
WBC: 19.6 10*3/uL — ABNORMAL HIGH (ref 4.0–10.5)
nRBC: 0 % (ref 0.0–0.2)

## 2019-10-23 LAB — COMPREHENSIVE METABOLIC PANEL
ALT: 42 U/L (ref 0–44)
AST: 92 U/L — ABNORMAL HIGH (ref 15–41)
Albumin: 2.4 g/dL — ABNORMAL LOW (ref 3.5–5.0)
Alkaline Phosphatase: 67 U/L (ref 38–126)
Anion gap: 7 (ref 5–15)
BUN: 7 mg/dL — ABNORMAL LOW (ref 8–23)
CO2: 20 mmol/L — ABNORMAL LOW (ref 22–32)
Calcium: 8.4 mg/dL — ABNORMAL LOW (ref 8.9–10.3)
Chloride: 104 mmol/L (ref 98–111)
Creatinine, Ser: 0.86 mg/dL (ref 0.44–1.00)
GFR calc Af Amer: >60 mL/min (ref >60)
GFR calc non Af Amer: >60 mL/min (ref >60)
Glucose, Bld: 94 mg/dL (ref 70–99)
Potassium: 4.7 mmol/L (ref 3.5–5.1)
Sodium: 131 mmol/L — ABNORMAL LOW (ref 135–145)
Total Bilirubin: 7.5 mg/dL — ABNORMAL HIGH (ref 0.3–1.2)
Total Protein: 6.9 g/dL (ref 6.5–8.1)

## 2019-10-23 LAB — URINE CULTURE: Culture: NO GROWTH

## 2019-10-23 LAB — PROTIME-INR
INR: 1.9 — ABNORMAL HIGH (ref 0.8–1.2)
Prothrombin Time: 21.4 seconds — ABNORMAL HIGH (ref 11.4–15.2)

## 2019-10-23 LAB — APTT: aPTT: 52 seconds — ABNORMAL HIGH (ref 24–36)

## 2019-10-23 LAB — MAGNESIUM: Magnesium: 1.9 mg/dL (ref 1.7–2.4)

## 2019-10-23 LAB — PHOSPHORUS: Phosphorus: 2.4 mg/dL — ABNORMAL LOW (ref 2.5–4.6)

## 2019-10-23 LAB — AMMONIA: Ammonia: 41 umol/L — ABNORMAL HIGH (ref 9–35)

## 2019-10-23 MED ORDER — CIPROFLOXACIN HCL 500 MG PO TABS
500.0000 mg | ORAL_TABLET | Freq: Every day | ORAL | 1 refills | Status: DC
Start: 2019-10-23 — End: 2019-12-17

## 2019-10-23 MED ORDER — NICOTINE 14 MG/24HR TD PT24: 14.0000 mg | MEDICATED_PATCH | Freq: Every day | TRANSDERMAL | 0 refills | Status: DC

## 2019-10-23 MED ORDER — FUROSEMIDE 40 MG PO TABS: 40.0000 mg | ORAL_TABLET | Freq: Every day | ORAL | 1 refills | Status: DC

## 2019-10-23 MED ORDER — SPIRONOLACTONE 100 MG PO TABS: 100.0000 mg | ORAL_TABLET | Freq: Every day | ORAL | 1 refills | Status: DC

## 2019-10-23 MED ORDER — THIAMINE HCL 100 MG PO TABS: 100.0000 mg | ORAL_TABLET | Freq: Every day | ORAL | 1 refills | Status: DC

## 2019-10-23 MED ORDER — CIPROFLOXACIN HCL 500 MG PO TABS
500.0000 mg | ORAL_TABLET | Freq: Every day | ORAL | Status: DC
  Filled 2019-10-23: qty 1

## 2019-10-23 MED ORDER — ADULT MULTIVITAMIN W/MINERALS CH: 1.0000 | ORAL_TABLET | Freq: Every day | ORAL | Status: AC

## 2019-10-23 MED FILL — VITAMIN B-1 100 MG TABS: 100 | 90 days supply | Qty: 90 | Fill #0

## 2019-10-23 MED FILL — SPIRONOLACTONE 100 MG TABS: 100 | 30 days supply | Qty: 30 | Fill #0

## 2019-10-23 MED FILL — CIPROFLOXACIN HCL 500 MG TA: 500 | 30 days supply | Qty: 30 | Fill #0

## 2019-10-23 MED FILL — FUROSEMIDE 40 MG TABLET: 40 | 30 days supply | Qty: 30 | Fill #0

## 2019-10-23 NOTE — Progress Notes (Signed)
Previous nicotine patch removed and new nicotine patch placed.

## 2019-10-23 NOTE — Discharge Summary (Signed)
Physician Discharge Summary  [facility] IRW:[id number] DOB: Aug 03, 1955 DOA: 10/14/2019  PCP: Patient, No Pcp Per  Admit date: 10/14/2019 Discharge date: 10/23/2019  Admitted From: Home Disposition: Home  Recommendations for Outpatient Follow-up:  1. Follow ups as below. 2. Please obtain CBC/BMP/Mag at follow up 3. Please follow up on the following pending results: None  Home Health: None Equipment/Devices: None  Discharge Condition: Stable CODE STATUS: Full code   Follow-up Information    [facility] Follow up.   Why: You have an appointment scheduled for November 13, 2019 @ 3:10 pm. Please bring your medications with you to this appointment. If you are unable to keep this appointment please call to reschedule.  Contact information: [address] [place] [address] [phone]              Hospital Course: 64 year old female with history of EtOH abuse with fever and lethargy and admitted for sepsis and acute respiratory failure with hypoxia due to community-acquired pneumonia and E. coli bacteremia.  Also found to have liver cirrhosis with ascites likely due to alcohol.  She was started on IV ceftriaxone.  She had a paracentesis on 6/17 with removal of 1.2 L with fluid study not suggestive for SBP.  She continues to be have leukocytosis despite course of IV ceftriaxone since 10/14/2019.  CT abdomen and pelvis on 6/22 concerning for bilateral pyelonephritis with drainable fluid collection or abscess, and cirrhosis with small ascites and anasarca. However, patient has no signs of clinical pyelonephritis or UTI. Repeat urine culture negative. Discussed with infectious disease, Dr. Orvan Falconer who recommended disregarding CT findings and leukocytosis and discontinuing antibiotics as long as patient is clinically well. He suggested rechecking CBC outpatient.   Patient discharged on Lasix, Aldactone and daily Cipro for SBP prophylaxis.  Counseled on alcohol and tobacco cessation. Ambulatory referral to GI ordered.  See individual problem list below for more hospital course.  Discharge Diagnoses:  Acute respiratory failure with hypoxia due to community-acquired pneumonia-resolved.  On room air. -Completed course with IV ceftriaxone and azithromycin  Sepsis due to community-acquired pneumonia and E. coli bacteremia: on IV ceftriaxone since 6/15 but with persistent leukocytosis.  CT abdomen and pelvis on 6/22 concerning for acute pyelonephritis. Her initial and repeat urine cultures negative. Clinically well-appearing. -Discontinued IV ceftriaxone after discussion with ID as above  Liver cirrhosis with ascites/coagulopathy/hyperbilirubinemia/elevated ammonia-likely due to alcohol abuse. -Paracentesis with removal of 1.2 L on 6/17. Fluid culture negative. -Discharged on Lasix 40 mg daily and Aldactone 100 mg daily -Started Cipro 500 mg daily for SBP prophylaxis.  Alcohol abuse: Stable -Encourage cessation.  Hypomagnesemia/hypokalemia-likely due to alcohol. Resolved. -Monitor and replace as appropriate  Hyponatremia-likely due to cirrhosis, alcohol and Aldactone. Stable. -Reduce Aldactone to 100 mg daily and added Lasix. -Recheck at follow-up  Macrocytic anemia: Likely due to liver cirrhosis from alcohol.  H&H stable. -Continue folic acid  Hemorrhoid -Continue hemorrhoid cream  Persistent leukocytosis: WBC 18 (admit)> 23 (peak)>> 19.  -Recheck CBC at follow-up. If still elevated WBC, recommend referral to hematology  BLE edema-likely due to cirrhosis and hypoalbuminemia.  Tobacco use disorder -Encourage cessation.   Body mass index is 30.01 kg/m.            Discharge Exam: Vitals:   10/23/19 0500 10/23/19 0752  BP:  (!) 106/57  Pulse: 84 97  Resp:  16  Temp:    SpO2:  96%    GENERAL: No apparent distress.  Nontoxic. HEENT:  MMM.  Vision and hearing grossly intact.  NECK: Supple.  No  apparent JVD.  RESP:  No IWOB.  Fair aeration bilaterally. CVS:  RRR. Heart sounds normal.  ABD/GI/GU: Bowel sounds present. Soft. Non tender.  MSK/EXT:  Moves extremities. No apparent deformity. 1+ edema bilaterally SKIN: no apparent skin lesion or wound NEURO: Awake, alert and oriented appropriately.  No apparent focal neuro deficit. PSYCH: Calm. Normal affect.  Discharge Instructions  Discharge Instructions    Ambulatory referral to Gastroenterology   Complete by: As directed    Liver cirrhosis   What is the reason for referral?: Other   Call MD for:  difficulty breathing, headache or visual disturbances   Complete by: As directed    Call MD for:  extreme fatigue   Complete by: As directed    Call MD for:  persistant dizziness or light-headedness   Complete by: As directed    Call MD for:  persistant nausea and vomiting   Complete by: As directed    Call MD for:  severe uncontrolled pain   Complete by: As directed    Diet - low sodium heart healthy   Complete by: As directed    Discharge instructions   Complete by: As directed    It has been a pleasure taking care of you!  You were hospitalized and treated for pneumonia and bloodstream infection.  You were also found to have liver cirrhosis likely due to alcohol.  You completed treatment for the infection.  We are discharging you on medications for liver cirrhosis.  It is very important that you quit drinking alcohol.  It is also very important that you quit smoking cigarettes.  Please take your medications as prescribed.  Please follow-up with your primary care doctor as soon as possible.  We also recommend cutting down on the sodium (salt) and water (fluid) intake, preferably less than 2 g (2000 mg)  and 1500 cc (6 caps) a day respectively.   Take care,   Increase activity slowly   Complete by: As directed      Allergies as of 10/23/2019   No Known Allergies     Medication List    TAKE these medications     ciprofloxacin 500 MG tablet Commonly known as: Cipro Take 1 tablet (500 mg total) by mouth daily.   furosemide 40 MG tablet Commonly known as: LASIX Take 1 tablet (40 mg total) by mouth daily. Start taking on: October 24, 2019   multivitamin with minerals Tabs tablet Take 1 tablet by mouth daily. Start taking on: October 24, 2019   nicotine 14 mg/24hr patch Commonly known as: NICODERM CQ - dosed in mg/24 hours Place 1 patch (14 mg total) onto the skin daily. Start taking on: October 24, 2019   spironolactone 100 MG tablet Commonly known as: ALDACTONE Take 1 tablet (100 mg total) by mouth daily. Start taking on: October 24, 2019   thiamine 100 MG tablet Take 1 tablet (100 mg total) by mouth daily. Start taking on: October 24, 2019       Consultations:  Gastroenterology  Infectious disease over the phone  Procedures/Studies: 6/17-paracentesis with removal of 1.2 L   DG Chest 2 View  Result Date: 10/20/2019 CLINICAL DATA:  Leukocytosis. EXAM: CHEST - 2 VIEW COMPARISON:  10/15/2019 FINDINGS: Significant improvement in bilateral airspace disease. Mild residual bibasilar atelectasis or infiltrate. Small left effusion. Negative for vascular congestion or edema. IMPRESSION: Significant improvement in bilateral airspace disease. Possible clearing of edema or  pneumonia. Electronically Signed   By: Marlan Palau M.D.   On: 10/20/2019 10:04   CT ABDOMEN PELVIS W CONTRAST  Result Date: 10/21/2019 CLINICAL DATA:  64 year old female with with abdominal distension and leukocytosis. EXAM: CT ABDOMEN AND PELVIS WITH CONTRAST TECHNIQUE: Multidetector CT imaging of the abdomen and pelvis was performed using the standard protocol following bolus administration of intravenous contrast. CONTRAST:  OMNIPAQUE IOHEXOL 300 MG/ML  SOLN COMPARISON:  Right upper quadrant ultrasound dated 10/15/2019. FINDINGS: Lower chest: Partially visualized small left pleural effusion. There is patchy area of  consolidative changes of the left lower lobe with air bronchogram which may represent atelectasis or pneumonia. Clinical correlation is recommended. No intra-abdominal free air. Small ascites. Hepatobiliary: Cirrhosis. No intrahepatic biliary ductal dilatation. There is small amount of sludge within the gallbladder. No calcified gallstone. Pancreas: Unremarkable. No pancreatic ductal dilatation or surrounding inflammatory changes. Spleen: Indeterminate 2 cm splenic hypodense lesion the the Adrenals/Urinary Tract: The adrenal glands are unremarkable. There is no hydronephrosis on either side. There is heterogeneous enhancement of the renal parenchyma most consistent with pyelonephritis. Correlation with urinalysis recommended. No drainable fluid collection or abscess. The visualized ureters and urinary bladder appear unremarkable. Stomach/Bowel: There is sigmoid diverticulosis. There is a small hiatal hernia. There is no bowel obstruction. The appendix is normal. Vascular/Lymphatic: Mild aortoiliac atherosclerotic disease. The IVC is unremarkable. No portal venous gas. There is no adenopathy. Reproductive: The uterus and ovaries are grossly unremarkable. Other: Diffuse subcutaneous edema and anasarca. Musculoskeletal: No acute or significant osseous findings. IMPRESSION: 1. Findings most consistent with bilateral pyelonephritis. Correlation with urinalysis recommended. No drainable fluid collection or abscess. 2. Cirrhosis with small ascites and anasarca. 3. Sigmoid diverticulosis. No bowel obstruction. Normal appendix. 4. Aortic Atherosclerosis (ICD10-I70.0). Electronically Signed   By: Elgie Collard M.D.   On: 10/21/2019 21:57   DG CHEST PORT 1 VIEW  Result Date: 10/15/2019 CLINICAL DATA:  Fever and hypoxia EXAM: PORTABLE CHEST 1 VIEW COMPARISON:  October 15, 2019 FINDINGS: There is a left pleural effusion with atelectasis and questionable superimposed pneumonia left lower lobe, stable. There is a small right  pleural effusion. There is mild interstitial edema. There is mild cardiomegaly with pulmonary venous hypertension. There is aortic atherosclerosis. IMPRESSION: Mild cardiomegaly with a degree of pulmonary vascular congestion and mild interstitial edema. Question a degree of congestive heart failure. Small left pleural effusion. Concern for pneumonia left lower lobe with consolidation left base. A degree of alveolar edema in this area is possible. Aortic Atherosclerosis (ICD10-I70.0). Electronically Signed   By: Bretta Bang III M.D.   On: 10/15/2019 10:00   DG Chest Port 1 View  Result Date: 10/15/2019 CLINICAL DATA:  Cough EXAM: PORTABLE CHEST 1 VIEW COMPARISON:  July 18, 2009 FINDINGS: There is mild cardiomegaly. Hazy patchy airspace consolidation seen at the left lung base. Mildly increased interstitial markings seen within the left upper lung. The right lung is clear. No acute osseous abnormality. IMPRESSION: Hazy patchy airspace consolidation at the left lung base, concerning for pneumonia. Electronically Signed   By: Jonna Clark M.D.   On: 10/15/2019 00:21   US Abdomen Limited RUQ  Result Date: 10/15/2019 CLINICAL DATA:  Elevated LFTs EXAM: ULTRASOUND ABDOMEN LIMITED RIGHT UPPER QUADRANT COMPARISON:  None. FINDINGS: Gallbladder: Layering sludge/small stones are seen. No sonographic Murphy sign noted by sonographer. Common bile duct: Diameter: 5 Liver: A nodular liver contour is seen with a heterogeneous echotexture. Portal vein is patent on color Doppler imaging with normal direction of blood  flow towards the liver. Other: A small amount of perihepatic ascites is seen. IMPRESSION: Layering sludge/stones. No definite evidence of acute cholecystitis. Findings suggestive of cirrhosis Small amount of perihepatic ascites. Electronically Signed   By: Jonna Clark M.D.   On: 10/15/2019 03:28   IR Paracentesis  Result Date: 10/16/2019 INDICATION: Ascites secondary to alcoholic cirrhosis. Request for  diagnostic and therapeutic paracentesis. EXAM: ULTRASOUND GUIDED PARACENTESIS MEDICATIONS: 1% lidocaine 15 mL COMPLICATIONS: None immediate. PROCEDURE: Informed written consent was obtained from the patient after a discussion of the risks, benefits and alternatives to treatment. A timeout was performed prior to the initiation of the procedure. Initial ultrasound scanning demonstrates a moderate amount of ascites within the right lower abdominal quadrant. The right lower abdomen was prepped and draped in the usual sterile fashion. 1% lidocaine was used for local anesthesia. Following this, a 19 gauge, 7-cm, Yueh catheter was introduced. An ultrasound image was saved for documentation purposes. The paracentesis was performed. The catheter was removed and a dressing was applied. The patient tolerated the procedure well without immediate post procedural complication. FINDINGS: A total of approximately 1.2 L of clear yellow fluid was removed. Samples were sent to the laboratory as requested by the clinical team. IMPRESSION: Successful ultrasound-guided paracentesis yielding 1.2 liters of peritoneal fluid. Read by: Corrin Parker, PA-C Electronically Signed   By: Corlis Leak M.D.   On: 10/16/2019 14:43        The results of significant diagnostics from this hospitalization (including imaging, microbiology, ancillary and laboratory) are listed below for reference.     Microbiology: Recent Results (from the past 240 hour(s))  Blood Culture (routine x 2)     Status: Abnormal   Collection Time: 10/14/19 11:57 PM   Specimen: BLOOD RIGHT FOREARM  Result Value Ref Range Status   Specimen Description BLOOD RIGHT FOREARM  Final   Special Requests   Final    BOTTLES DRAWN AEROBIC AND ANAEROBIC Blood Culture adequate volume   Culture  Setup Time   Final    GRAM NEGATIVE RODS IN BOTH AEROBIC AND ANAEROBIC BOTTLES CRITICAL RESULT CALLED TO, READ BACK BY AND VERIFIED WITH: Peter Minium PHARMD 1527 10/15/19 A BROWNING     Culture (A)  Final    ESCHERICHIA COLI SUSCEPTIBILITIES PERFORMED ON PREVIOUS CULTURE WITHIN THE LAST 5 DAYS. Performed at [facility], [address], [place], [place] [address]    Report Status 10/17/2019 FINAL  Final  SARS Coronavirus 2 by RT PCR (hospital order, performed in [facility] hospital lab) Nasopharyngeal Nasopharyngeal Swab     Status: None   Collection Time: 10/14/19 11:57 PM   Specimen: Nasopharyngeal Swab  Result Value Ref Range Status   SARS Coronavirus 2 NEGATIVE NEGATIVE Final    Comment: (NOTE) SARS-CoV-2 target nucleic acids are NOT DETECTED.  The SARS-CoV-2 RNA is generally detectable in upper and lower respiratory specimens during the acute phase of infection. The lowest concentration of SARS-CoV-2 viral copies this assay can detect is 250 copies / mL. A negative result does not preclude SARS-CoV-2 infection and should not be used as the sole basis for treatment or other patient management decisions.  A negative result may occur with improper specimen collection / handling, submission of specimen other than nasopharyngeal swab, presence of viral mutation(s) within the areas targeted by this assay, and inadequate number of viral copies (<250 copies / mL). A negative result must be combined with clinical observations, patient history, and epidemiological information.  Fact Sheet for Patients:  StrictlyIdeas.no  Fact Sheet for Healthcare Providers: BankingDealers.co.za  This test is not yet approved or  cleared by the [place] FDA and has been authorized for detection and/or diagnosis of SARS-CoV-2 by FDA under an Emergency Use Authorization (EUA).  This EUA will remain in effect (meaning this test can be used) for the duration of the COVID-19 declaration under Section 564(b)(1) of the Act, 21 U.S.C. section 360bbb-3(b)(1), unless the authorization is terminated or revoked sooner.  Performed at  [facility], [place] [address], [place], [place] [address]   Blood Culture (routine x 2)     Status: Abnormal   Collection Time: 10/15/19 12:08 AM   Specimen: BLOOD LEFT HAND  Result Value Ref Range Status   Specimen Description BLOOD LEFT HAND  Final   Special Requests   Final    BOTTLES DRAWN AEROBIC AND ANAEROBIC Blood Culture results may not be optimal due to an inadequate volume of blood received in culture bottles   Culture  Setup Time   Final    GRAM NEGATIVE RODS IN BOTH AEROBIC AND ANAEROBIC BOTTLES CRITICAL RESULT CALLED TO, READ BACK BY AND VERIFIED WITHAndres Shad PHARMD [id number] 10/15/19 A BROWNING Performed at [facility], [place] [address], [place], [place] [address]    Culture ESCHERICHIA COLI (A)  Final   Report Status 10/17/2019 FINAL  Final   Organism ID, Bacteria ESCHERICHIA COLI  Final      Susceptibility   Escherichia coli - MIC*    AMPICILLIN >=32 RESISTANT Resistant     CEFAZOLIN <=4 SENSITIVE Sensitive     CEFEPIME <=0.12 SENSITIVE Sensitive     CEFTAZIDIME <=1 SENSITIVE Sensitive     CEFTRIAXONE <=0.25 SENSITIVE Sensitive     CIPROFLOXACIN <=0.25 SENSITIVE Sensitive     GENTAMICIN <=1 SENSITIVE Sensitive     IMIPENEM <=0.25 SENSITIVE Sensitive     TRIMETH/SULFA <=20 SENSITIVE Sensitive     AMPICILLIN/SULBACTAM 16 INTERMEDIATE Intermediate     PIP/TAZO <=4 SENSITIVE Sensitive     * ESCHERICHIA COLI  Blood Culture ID Panel (Reflexed)     Status: Abnormal   Collection Time: 10/15/19 12:08 AM  Result Value Ref Range Status   Enterococcus species NOT DETECTED NOT DETECTED Final   Listeria monocytogenes NOT DETECTED NOT DETECTED Final   Staphylococcus species NOT DETECTED NOT DETECTED Final   Staphylococcus aureus (BCID) NOT DETECTED NOT DETECTED Final   Streptococcus species NOT DETECTED NOT DETECTED Final   Streptococcus agalactiae NOT DETECTED NOT DETECTED Final   Streptococcus pneumoniae NOT DETECTED NOT DETECTED Final   Streptococcus pyogenes NOT  DETECTED NOT DETECTED Final   Acinetobacter baumannii NOT DETECTED NOT DETECTED Final   Enterobacteriaceae species DETECTED (A) NOT DETECTED Final    Comment: Enterobacteriaceae represent a large family of gram-negative bacteria, not a single organism. CRITICAL RESULT CALLED TO, READ BACK BY AND VERIFIED WITH: Andres Shad PHARMD 1527 10/15/19 A BROWNING    Enterobacter cloacae complex NOT DETECTED NOT DETECTED Final   Escherichia coli DETECTED (A) NOT DETECTED Final    Comment: CRITICAL RESULT CALLED TO, READ BACK BY AND VERIFIED WITH: Andres Shad PHARMD 1527 10/15/19 A BROWNING    Klebsiella oxytoca NOT DETECTED NOT DETECTED Final   Klebsiella pneumoniae NOT DETECTED NOT DETECTED Final   Proteus species NOT DETECTED NOT DETECTED Final   Serratia marcescens NOT DETECTED NOT DETECTED Final   Carbapenem resistance NOT DETECTED NOT DETECTED Final   Haemophilus influenzae NOT DETECTED NOT DETECTED Final   Neisseria  meningitidis NOT DETECTED NOT DETECTED Final   Pseudomonas aeruginosa NOT DETECTED NOT DETECTED Final   Candida albicans NOT DETECTED NOT DETECTED Final   Candida glabrata NOT DETECTED NOT DETECTED Final   Candida krusei NOT DETECTED NOT DETECTED Final   Candida parapsilosis NOT DETECTED NOT DETECTED Final   Candida tropicalis NOT DETECTED NOT DETECTED Final    Comment: Performed at [facility], [address] [place], [place] [address]  Gram stain     Status: None   Collection Time: 10/16/19  2:50 PM   Specimen: Abdomen; Peritoneal Fluid  Result Value Ref Range Status   Specimen Description FLUID PERITONEAL ABDOMEN  Final   Special Requests NONE  Final   Gram Stain   Final    WBC PRESENT,BOTH PMN AND MONONUCLEAR NO ORGANISMS SEEN CYTOSPIN SMEAR Performed at [facility], [address] [place], [place] [address]    Report Status 10/16/2019 FINAL  Final  Culture, body fluid-bottle     Status: None   Collection Time: 10/16/19  2:50 PM   Specimen: Fluid  Result  Value Ref Range Status   Specimen Description FLUID PERITONEAL ABDOMEN  Final   Special Requests BOTTLES DRAWN AEROBIC AND ANAEROBIC  Final   Culture   Final    NO GROWTH 5 DAYS Performed at [facility], [address] [place], [place] [address]    Report Status 10/21/2019 FINAL  Final  Culture, blood (routine x 2)     Status: None   Collection Time: 10/17/19  9:22 AM   Specimen: BLOOD  Result Value Ref Range Status   Specimen Description BLOOD RIGHT ANTECUBITAL  Final   Special Requests   Final    BOTTLES DRAWN AEROBIC AND ANAEROBIC Blood Culture adequate volume   Culture   Final    NO GROWTH 5 DAYS Performed at [facility], [address] [place], [place] [address]    Report Status 10/22/2019 FINAL  Final  Culture, blood (routine x 2)     Status: None   Collection Time: 10/17/19  9:27 AM   Specimen: BLOOD LEFT HAND  Result Value Ref Range Status   Specimen Description BLOOD LEFT HAND  Final   Special Requests   Final    BOTTLES DRAWN AEROBIC ONLY Blood Culture adequate volume   Culture   Final    NO GROWTH 5 DAYS Performed at [facility], [address], [place], [place] [address]    Report Status 10/22/2019 FINAL  Final  Urine culture     Status: None   Collection Time: 10/17/19 10:00 AM   Specimen: In/Out Cath Urine  Result Value Ref Range Status   Specimen Description IN/OUT CATH URINE  Final   Special Requests NONE  Final   Culture   Final    NO GROWTH Performed at [facility] [place] [facility], [address], [place], [place] [address]    Report Status 10/18/2019 FINAL  Final  Culture, Urine     Status: None   Collection Time: 10/22/19 10:26 AM   Specimen: Urine, Catheterized  Result Value Ref Range Status   Specimen Description URINE, CATHETERIZED  Final   Special Requests NONE  Final   Culture   Final    NO GROWTH Performed at [facility], [address], [place], [place] [address]    Report Status 10/23/2019 FINAL  Final      Labs: BNP (last 3 results) Recent Labs  10/14/19 2357  BNP 114.9*   Basic Metabolic Panel: Recent Labs  Lab 10/19/19 0635 10/20/19 0358 10/21/19 0824 10/22/19 0340 10/23/19 0315  NA 130* 129* 128* 130* 131*  K 4.4 3.9 4.5 4.6 4.7  CL 99 100 100 103 104  CO2 24 22 20* 20* 20*  GLUCOSE 86 102* 160* 103* 94  BUN 12 9 10 8  7*  CREATININE 0.74 0.80 0.94 0.84 0.86  CALCIUM 8.0* 7.9* 8.2* 8.1* 8.4*  MG  --   --   --   --  1.9  PHOS  --   --   --   --  2.4*   Liver Function Tests: Recent Labs  Lab 10/17/19 0925 10/18/19 0335 10/23/19 0315  AST 229* 180* 92*  ALT 66* 57* 42  ALKPHOS 50 53 67  BILITOT 8.0* 8.2* 7.5*  PROT 6.4* 5.6* 6.9  ALBUMIN 2.0* 1.8* 2.4*   No results for input(s): LIPASE, AMYLASE in the last 168 hours. Recent Labs  Lab 10/23/19 0315  AMMONIA 41*   CBC: Recent Labs  Lab 10/19/19 0635 10/19/19 0635 10/20/19 0358 10/20/19 2055 10/21/19 0824 10/22/19 0340 10/23/19 0315  WBC 18.0*  --  20.5*  --  23.1* 19.1* 19.6*  NEUTROABS  --   --   --   --   --   --  14.5*  HGB 9.8*   < > 9.6* 10.0* 10.7* 9.6* 10.2*  HCT 27.2*   < > 26.7* 28.3* 30.6* 27.4* 29.1*  MCV 104.6*  --  103.9*  --  106.3* 106.2* 106.6*  PLT 243  --  267  --  350 320 339   < > = values in this interval not displayed.   Cardiac Enzymes: No results for input(s): CKTOTAL, CKMB, CKMBINDEX, TROPONINI in the last 168 hours. BNP: Invalid input(s): POCBNP CBG: Recent Labs  Lab 10/19/19 2213 10/20/19 0729 10/20/19 1116  GLUCAP 121* 109* 214*   D-Dimer No results for input(s): DDIMER in the last 72 hours. Hgb A1c No results for input(s): HGBA1C in the last 72 hours. Lipid Profile No results for input(s): CHOL, HDL, LDLCALC, TRIG, CHOLHDL, LDLDIRECT in the last 72 hours. Thyroid function studies No results for input(s): TSH, T4TOTAL, T3FREE, THYROIDAB in the last 72 hours.  Invalid input(s): FREET3 Anemia work up No results for input(s): VITAMINB12, FOLATE, FERRITIN,  TIBC, IRON, RETICCTPCT in the last 72 hours. Urinalysis No results found for: COLORURINE, APPEARANCEUR, LABSPEC, PHURINE, GLUCOSEU, HGBUR, BILIRUBINUR, KETONESUR, PROTEINUR, UROBILINOGEN, NITRITE, LEUKOCYTESUR Sepsis Labs Invalid input(s): PROCALCITONIN,  WBC,  LACTICIDVEN   Time coordinating discharge: 40 minutes  SIGNED:  Almon Herculesaye T Pollyanna Levay, MD  [facility] 10/23/2019, 5:09 PM  If 7PM-7AM, please contact night-coverage [email or web address] Password [email or web address]

## 2019-10-23 NOTE — TOC Progression Note (Signed)
Transition of Care [facility]) - Progression Note    Patient Details  Name: Shirley Mays MRN: [id number] Date of Birth: 1956-02-15  Transition of Care [facility]) CM/SW Contact  Beckie Busing, RN Phone Number: [phone]  10/23/2019, 11:11 AM  Clinical Narrative:   $10 copay for made paid to [facility]. Meds to be delivered to the room. Bedside nurse has been updated.    Expected Discharge Plan: Home/Self Care Barriers to Discharge: Continued Medical Work up  Expected Discharge Plan and Services Expected Discharge Plan: Home/Self Care In-house Referral: NA   Post Acute Care Choice: NA Living arrangements for the past 2 months: Single Family Home Expected Discharge Date: 10/23/19               DME Arranged: N/A DME Agency: NA       HH Arranged: NA HH Agency: NA         Social Determinants of Health (SDOH) Interventions    Readmission Risk Interventions No flowsheet data found.

## 2019-10-23 NOTE — Progress Notes (Signed)
New order to discharge patient home.  Midline and cardiac monitoring discontinued.  AVS completed then reviewed with patient.  All questions answered.  Patient expressed understanding.  AVS given to patient to take home.  Pharmacy delivered home medications to patient's room.  Patient's daughter scheduled to arrive at 1230 to transport patient home.  Patient is excited to go home and is in agreement with discharge.

## 2019-11-12 NOTE — Progress Notes (Signed)
Patient ID: Shirley Mays, female   DOB: 1955/07/26, 64 y.o.   MRN: [id number]     Shirley Mays, is a 64 y.o. female  EHO:[id number]  [id number]  DOB - 1956-03-06  Subjective:  Chief Complaint and HPI: Shirley Mays is a 64 y.o. female here today to establish care and for a follow up visit After hospitalization 6/15-6/24/2021 for sepsis.  She is still on cipro.  No fever.  No cough.  No UTI s/sx.  No abdominal pain.  Abdominal pain has resolved.  Not drinking alcohol since admission.  Her daughter is here with her.  She has not been giving her lasix for about 1 week bc leg swelling has improved.  The patient c/o itchin all over without rash.    From discharge summary: Hospital Course: 64 year old female with history of EtOH abuse with fever and lethargy and admitted for sepsis and acute respiratory failure with hypoxia due to community-acquired pneumonia and E. coli bacteremia.Also found to have liver cirrhosis with ascites likely due to alcohol. She was started on IV ceftriaxone.She had a paracentesis on 6/17 with removal of 1.2 L with fluid study not suggestive for SBP. She continues to be have leukocytosis despite course of IV ceftriaxone since 10/14/2019. CT abdomen and pelvis on 6/22 concerning for bilateral pyelonephritis with drainable fluid collection or abscess, andcirrhosis with small ascites and anasarca. However, patient has no signs of clinical pyelonephritis or UTI. Repeat urine culture negative. Discussed with infectious disease, Dr. Orvan Falconer who recommended disregarding CT findings and leukocytosis and discontinuing antibiotics as long as patient is clinically well. He suggested rechecking CBC outpatient.   Patient discharged on Lasix, Aldactone and daily Cipro for SBP prophylaxis. Counseled on alcohol and tobacco cessation. Ambulatory referral to GI ordered.  See individual problem list below for more hospital course.  Discharge Diagnoses:  Acute respiratory failure with  hypoxia due to community-acquired pneumonia-resolved. On room air. -Completed course with IV ceftriaxone and azithromycin  Sepsis due to community-acquired pneumonia andE. coli bacteremia: on IV ceftriaxone since 6/15but withpersistent leukocytosis.CT abdomen and pelvis on 6/22 concerning for acute pyelonephritis. Her initial and repeat urine cultures negative. Clinically well-appearing. -Discontinued IV ceftriaxone after discussion with ID as above  Liver cirrhosis with ascites/coagulopathy/hyperbilirubinemia/elevated ammonia-likely due to alcohol abuse. -Paracentesis with removal of 1.2 L on 6/17. Fluid culture negative. -Discharged on Lasix 40 mg daily and Aldactone 100 mg daily -Started Cipro 500 mg daily for SBP prophylaxis.  Alcohol abuse: Stable -Encourage cessation.  Hypomagnesemia/hypokalemia-likely due to alcohol. Resolved. -Monitor and replace as appropriate  Hyponatremia-likely due to cirrhosis, alcohol and Aldactone. Stable. -Reduce Aldactone to 100 mg daily and added Lasix. -Recheck at follow-up  Macrocytic anemia: Likely due to liver cirrhosis from alcohol. H&H stable. -Continue folic acid  Hemorrhoid -Continue hemorrhoid cream  Persistent leukocytosis:WBC 18 (admit)>23 (peak)>>19.  -Recheck CBC at follow-up. If still elevated WBC, recommend referral to hematology  BLE edema-likely due to cirrhosis and hypoalbuminemia.  Tobacco use disorder -Encourage cessation.   Body mass index is 30.01 kg/m.   ED/Hospital notes reviewed.   Social History: Family history:  ROS:   Constitutional:  No f/c, No night sweats, No unexplained weight loss. EENT:  No vision changes, No blurry vision, No hearing changes. No mouth, throat, or ear problems.  Respiratory: No cough, No SOB Cardiac: No CP, no palpitations GI:  No abd pain, No N/V/D. GU: No Urinary s/sx Musculoskeletal: No joint pain Neuro: No headache, no dizziness, no motor weakness.    Skin: No rash Endocrine:  No polydipsia. No polyuria.  Psych: Denies SI/HI  No problems updated.  ALLERGIES: No Known Allergies  PAST MEDICAL HISTORY: No past medical history on file.  MEDICATIONS AT HOME: Prior to Admission medications   Medication Sig Start Date End Date Taking? Authorizing Provider  ciprofloxacin (CIPRO) 500 MG tablet Take 1 tablet (500 mg total) by mouth daily. 10/23/19 04/20/20 Yes Almon Hercules, MD  Multiple Vitamin (MULTIVITAMIN WITH MINERALS) TABS tablet Take 1 tablet by mouth daily. 10/24/19  Yes Almon Hercules, MD  sertraline (ZOLOFT) 100 MG tablet Take 100 mg by mouth daily. Pt stated taking it at bedtime   Yes [provider]  spironolactone (ALDACTONE) 100 MG tablet Take 1 tablet (100 mg total) by mouth daily. 10/24/19  Yes Almon Hercules, MD  thiamine 100 MG tablet Take 1 tablet (100 mg total) by mouth daily. 10/24/19  Yes Almon Hercules, MD  cholestyramine (QUESTRAN) 4 g packet Take 1 packet (4 g total) by mouth 3 (three) times daily with meals. For itching 11/19/19   Anders Simmonds, PA-C  fluconazole (DIFLUCAN) 150 MG tablet Take 1 tablet (150 mg total) by mouth once for 1 dose. 11/19/19 11/19/19  Anders Simmonds, PA-C  furosemide (LASIX) 40 MG tablet Take 1 tablet (40 mg total) by mouth daily. Patient not taking: Reported on 11/19/2019 10/24/19   Almon Hercules, MD  nicotine (NICODERM CQ - DOSED IN MG/24 HOURS) 14 mg/24hr patch Place 1 patch (14 mg total) onto the skin daily. Patient not taking: Reported on 11/19/2019 10/24/19   Almon Hercules, MD     Objective:  EXAM:   Vitals:   11/19/19 1338  BP: 107/65  Pulse: 77  Resp: 16  Temp: 98.3 F (36.8 C)  SpO2: 100%  Weight: 147 lb (66.7 kg)  Height: 5\' 7"  (1.702 m)    General appearance : A&OX3. NAD. Non-toxic-appearing HEENT: Atraumatic and Normocephalic.  PERRLA. EOM intact.  TNeck: supple, no JVD. No cervical lymphadenopathy. No thyromegaly Chest/Lungs:  Breathing-non-labored, Good air  entry bilaterally, breath sounds normal without rales, rhonchi, or wheezing  CVS: S1 S2 regular, no murmurs, gallops, rubs  Abdomen: Bowel sounds present, Non tender and not distended with no gaurding, rigidity or rebound. Extremities: Bilateral Lower Ext shows no edema, both legs are warm to touch with = pulse throughout Neurology:  CN II-XII grossly intact, Non focal.   Psych:  TP linear. J/I WNL. Normal speech. Appropriate eye contact and affect.  Skin:  No Rash  Data Review No results found for: HGBA1C   Assessment & Plan   1. Liver disease-secondary to alcohol abuse.  Not drinking since admission.  Advised continued cessation.  I have counseled the patient at length about substance abuse and addiction.  12 step meetings/recovery recommended.  Local 12 step meeting lists were given and attendance was encouraged.  Patient expresses understanding.  - Phosphorus - Comprehensive metabolic panel - cholestyramine (QUESTRAN) 4 g packet; Take 1 packet (4 g total) by mouth 3 (three) times daily with meals. For itching  Dispense: 60 each; Refill: 12 - Ambulatory referral to Gastroenterology  2. Thrombocytopenia (HCC) - Phosphorus - Ambulatory referral to Hematology - Ambulatory referral to Gastroenterology  3. Anemia, unspecified type - CBC with Differential/Platelet - Ambulatory referral to Gastroenterology  4. Leukocytosis, unspecified type - CBC with Differential/Platelet  5. Sepsis, due to unspecified organism, unspecified whether acute organ dysfunction present [facility]) Resolved clinically/feeling better.  Continue cipro per directed course - CBC with Differential/Platelet  6. Hospital discharge follow-up  7. Itching Due to #1 - cholestyramine (QUESTRAN) 4 g packet; Take 1 packet (4 g total) by mouth 3 (three) times daily with meals. For itching  Dispense: 60 each; Refill: 12  8. Low serum phosphorus for age - Phosphorus   Patient have been counseled extensively about  nutrition and exercise  Return in about 6 weeks (around 12/31/2019) for assign PCP.  The patient was given clear instructions to go to ER or return to medical center if symptoms don't improve, worsen or new problems develop. The patient verbalized understanding. The patient was told to call to get lab results if they haven't heard anything in the next week.     [place], PA-C [facility] [place], [place] [phone]   11/19/2019, 2:02 PM

## 2019-11-13 ENCOUNTER — Inpatient Hospital Stay: Payer: Self-pay | Admitting: Physician Assistant

## 2019-11-19 ENCOUNTER — Encounter: Payer: Self-pay | Admitting: Physician Assistant

## 2019-11-19 ENCOUNTER — Other Ambulatory Visit: Payer: Self-pay

## 2019-11-19 ENCOUNTER — Ambulatory Visit: Payer: Self-pay | Attending: Physician Assistant | Admitting: Physician Assistant

## 2019-11-19 VITALS — BP 107/65 | HR 77 | Temp 98.3°F | Resp 16 | Ht 67.0 in | Wt 147.0 lb

## 2019-11-19 DIAGNOSIS — L299 Pruritus, unspecified: Secondary | ICD-10-CM

## 2019-11-19 DIAGNOSIS — K769 Liver disease, unspecified: Secondary | ICD-10-CM

## 2019-11-19 DIAGNOSIS — A419 Sepsis, unspecified organism: Secondary | ICD-10-CM

## 2019-11-19 DIAGNOSIS — D72829 Elevated white blood cell count, unspecified: Secondary | ICD-10-CM

## 2019-11-19 DIAGNOSIS — Z09 Encounter for follow-up examination after completed treatment for conditions other than malignant neoplasm: Secondary | ICD-10-CM

## 2019-11-19 DIAGNOSIS — R790 Abnormal level of blood mineral: Secondary | ICD-10-CM

## 2019-11-19 DIAGNOSIS — D696 Thrombocytopenia, unspecified: Secondary | ICD-10-CM

## 2019-11-19 DIAGNOSIS — D649 Anemia, unspecified: Secondary | ICD-10-CM

## 2019-11-19 MED ORDER — CHOLESTYRAMINE 4 G PO PACK: 4.0000 g | PACK | Freq: Three times a day (TID) | ORAL | 12 refills | Status: DC

## 2019-11-19 MED ORDER — FLUCONAZOLE 150 MG PO TABS
150.0000 mg | ORAL_TABLET | Freq: Once | ORAL | 0 refills | Status: AC
Start: 2019-11-19 — End: 2019-11-19

## 2019-11-19 MED FILL — CHOLESTYRAMINE PACKET: 4 | 20 days supply | Qty: 60 | Fill #0

## 2019-11-19 MED FILL — SPIRONOLACTONE 100 MG TAB: 100 | 30 days supply | Qty: 30 | Fill #0

## 2019-11-19 MED FILL — FLUCONAZOLE 150 MG TABLET: 150 | 1 days supply | Qty: 1 | Fill #0

## 2019-11-19 MED FILL — CIPROFLOXACIN HCL 500 MG TA: 500 | 30 days supply | Qty: 30 | Fill #0

## 2019-11-19 NOTE — Progress Notes (Signed)
Here for ED f /u

## 2019-11-19 NOTE — Patient Instructions (Signed)
[email or web address] is the website for [employer]

## 2019-11-20 ENCOUNTER — Telehealth: Payer: Self-pay | Admitting: Adult Health

## 2019-11-20 LAB — CBC WITH DIFFERENTIAL/PLATELET
Basophils Absolute: 0.1 10*3/uL (ref 0.0–0.2)
Basos: 1 %
EOS (ABSOLUTE): 0.1 10*3/uL (ref 0.0–0.4)
Eos: 1 %
Hematocrit: 26.4 % — ABNORMAL LOW (ref 34.0–46.6)
Hemoglobin: 9.7 g/dL — ABNORMAL LOW (ref 11.1–15.9)
Immature Grans (Abs): 0 10*3/uL (ref 0.0–0.1)
Immature Granulocytes: 0 %
Lymphocytes Absolute: 1.9 10*3/uL (ref 0.7–3.1)
Lymphs: 24 %
MCH: 38.3 pg — ABNORMAL HIGH (ref 26.6–33.0)
MCHC: 36.7 g/dL — ABNORMAL HIGH (ref 31.5–35.7)
MCV: 104 fL — ABNORMAL HIGH (ref 79–97)
Monocytes Absolute: 0.9 10*3/uL (ref 0.1–0.9)
Monocytes: 11 %
Neutrophils Absolute: 5 10*3/uL (ref 1.4–7.0)
Neutrophils: 63 %
Platelets: 154 10*3/uL (ref 150–450)
RBC: 2.53 x10E6/uL — CL (ref 3.77–5.28)
RDW: 11.7 % (ref 11.7–15.4)
WBC: 8 10*3/uL (ref 3.4–10.8)

## 2019-11-20 LAB — COMPREHENSIVE METABOLIC PANEL
ALT: 22 IU/L (ref 0–32)
AST: 39 IU/L (ref 0–40)
Albumin/Globulin Ratio: 0.9 — ABNORMAL LOW (ref 1.2–2.2)
Albumin: 3.6 g/dL — ABNORMAL LOW (ref 3.8–4.8)
Alkaline Phosphatase: 70 IU/L (ref 48–121)
BUN/Creatinine Ratio: 15 (ref 12–28)
BUN: 20 mg/dL (ref 8–27)
Bilirubin Total: 3.1 mg/dL — ABNORMAL HIGH (ref 0.0–1.2)
CO2: 18 mmol/L — ABNORMAL LOW (ref 20–29)
Calcium: 9.9 mg/dL (ref 8.7–10.3)
Chloride: 104 mmol/L (ref 96–106)
Creatinine, Ser: 1.37 mg/dL — ABNORMAL HIGH (ref 0.57–1.00)
GFR calc Af Amer: 47 mL/min/{1.73_m2} — ABNORMAL LOW (ref >59)
GFR calc non Af Amer: 41 mL/min/{1.73_m2} — ABNORMAL LOW (ref >59)
Globulin, Total: 4.2 g/dL (ref 1.5–4.5)
Glucose: 83 mg/dL (ref 65–99)
Potassium: 5.7 mmol/L — ABNORMAL HIGH (ref 3.5–5.2)
Sodium: 133 mmol/L — ABNORMAL LOW (ref 134–144)
Total Protein: 7.8 g/dL (ref 6.0–8.5)

## 2019-11-20 LAB — PHOSPHORUS: Phosphorus: 3.9 mg/dL (ref 3.0–4.3)

## 2019-11-20 NOTE — Telephone Encounter (Signed)
Received a new hem referral from [place], [place] for thrombocytopenia. Shirley Mays has been scheduled to see Mardella Layman on 8/5 at 1:30pm w/labs at 1pm. I was unable to reach the pt because her vm hasn't been setup. Letter mailed.

## 2019-11-24 ENCOUNTER — Other Ambulatory Visit: Payer: Self-pay | Admitting: Adult Health

## 2019-11-24 DIAGNOSIS — D696 Thrombocytopenia, unspecified: Secondary | ICD-10-CM

## 2019-12-04 ENCOUNTER — Inpatient Hospital Stay: Payer: Self-pay

## 2019-12-04 ENCOUNTER — Encounter: Payer: Self-pay | Admitting: Adult Health

## 2019-12-04 ENCOUNTER — Other Ambulatory Visit: Payer: Self-pay

## 2019-12-04 ENCOUNTER — Inpatient Hospital Stay: Payer: Self-pay | Attending: Adult Health | Admitting: Adult Health

## 2019-12-04 VITALS — BP 106/61 | HR 81 | Temp 98.7°F | Resp 18 | Wt 149.1 lb

## 2019-12-04 DIAGNOSIS — D696 Thrombocytopenia, unspecified: Secondary | ICD-10-CM | POA: Insufficient documentation

## 2019-12-04 DIAGNOSIS — R5383 Other fatigue: Secondary | ICD-10-CM | POA: Insufficient documentation

## 2019-12-04 DIAGNOSIS — D649 Anemia, unspecified: Secondary | ICD-10-CM

## 2019-12-04 DIAGNOSIS — F1010 Alcohol abuse, uncomplicated: Secondary | ICD-10-CM | POA: Insufficient documentation

## 2019-12-04 DIAGNOSIS — F17210 Nicotine dependence, cigarettes, uncomplicated: Secondary | ICD-10-CM | POA: Insufficient documentation

## 2019-12-04 DIAGNOSIS — K7460 Unspecified cirrhosis of liver: Secondary | ICD-10-CM | POA: Insufficient documentation

## 2019-12-04 DIAGNOSIS — R634 Abnormal weight loss: Secondary | ICD-10-CM | POA: Insufficient documentation

## 2019-12-04 DIAGNOSIS — Z1231 Encounter for screening mammogram for malignant neoplasm of breast: Secondary | ICD-10-CM

## 2019-12-04 DIAGNOSIS — D539 Nutritional anemia, unspecified: Secondary | ICD-10-CM | POA: Insufficient documentation

## 2019-12-04 DIAGNOSIS — Z79899 Other long term (current) drug therapy: Secondary | ICD-10-CM | POA: Insufficient documentation

## 2019-12-04 LAB — COMPREHENSIVE METABOLIC PANEL
ALT: 19 U/L (ref 0–44)
AST: 41 U/L (ref 15–41)
Albumin: 3 g/dL — ABNORMAL LOW (ref 3.5–5.0)
Alkaline Phosphatase: 61 U/L (ref 38–126)
Anion gap: 7 (ref 5–15)
BUN: 20 mg/dL (ref 8–23)
CO2: 16 mmol/L — ABNORMAL LOW (ref 22–32)
Calcium: 9.7 mg/dL (ref 8.9–10.3)
Chloride: 110 mmol/L (ref 98–111)
Creatinine, Ser: 1.17 mg/dL — ABNORMAL HIGH (ref 0.44–1.00)
GFR calc Af Amer: 57 mL/min — ABNORMAL LOW (ref >60)
GFR calc non Af Amer: 49 mL/min — ABNORMAL LOW (ref >60)
Glucose, Bld: 79 mg/dL (ref 70–99)
Potassium: 5 mmol/L (ref 3.5–5.1)
Sodium: 133 mmol/L — ABNORMAL LOW (ref 135–145)
Total Bilirubin: 2.7 mg/dL — ABNORMAL HIGH (ref 0.3–1.2)
Total Protein: 7.8 g/dL (ref 6.5–8.1)

## 2019-12-04 LAB — CBC WITH DIFFERENTIAL (CANCER CENTER ONLY)
Abs Immature Granulocytes: 0.02 10*3/uL (ref 0.00–0.07)
Basophils Absolute: 0.1 10*3/uL (ref 0.0–0.1)
Basophils Relative: 1 %
Eosinophils Absolute: 0.1 10*3/uL (ref 0.0–0.5)
Eosinophils Relative: 2 %
HCT: 28.4 % — ABNORMAL LOW (ref 36.0–46.0)
Hemoglobin: 9.6 g/dL — ABNORMAL LOW (ref 12.0–15.0)
Immature Granulocytes: 0 %
Lymphocytes Relative: 22 %
Lymphs Abs: 1.6 10*3/uL (ref 0.7–4.0)
MCH: 35.6 pg — ABNORMAL HIGH (ref 26.0–34.0)
MCHC: 33.8 g/dL (ref 30.0–36.0)
MCV: 105.2 fL — ABNORMAL HIGH (ref 80.0–100.0)
Monocytes Absolute: 0.6 10*3/uL (ref 0.1–1.0)
Monocytes Relative: 8 %
Neutro Abs: 5.1 10*3/uL (ref 1.7–7.7)
Neutrophils Relative %: 67 %
Platelet Count: 134 10*3/uL — ABNORMAL LOW (ref 150–400)
RBC: 2.7 MIL/uL — ABNORMAL LOW (ref 3.87–5.11)
RDW: 12.9 % (ref 11.5–15.5)
WBC Count: 7.6 10*3/uL (ref 4.0–10.5)
nRBC: 0 % (ref 0.0–0.2)

## 2019-12-04 LAB — IRON AND TIBC
Iron: 100 ug/dL (ref 41–142)
Saturation Ratios: 60 % — ABNORMAL HIGH (ref 21–57)
TIBC: 168 ug/dL — ABNORMAL LOW (ref 236–444)
UIBC: 68 ug/dL — ABNORMAL LOW (ref 120–384)

## 2019-12-04 LAB — RETIC PANEL
Immature Retic Fract: 8 % (ref 2.3–15.9)
RBC.: 2.67 MIL/uL — ABNORMAL LOW (ref 3.87–5.11)
Retic Count, Absolute: 57.4 10*3/uL (ref 19.0–186.0)
Retic Ct Pct: 2.2 % (ref 0.4–3.1)
Reticulocyte Hemoglobin: 37.9 pg (ref >27.9)

## 2019-12-04 LAB — IMMATURE PLATELET FRACTION: Immature Platelet Fraction: 2.8 % (ref 1.2–8.6)

## 2019-12-04 LAB — VITAMIN B12: Vitamin B-12: 691 pg/mL (ref 180–914)

## 2019-12-04 LAB — FERRITIN: Ferritin: 1379 ng/mL — ABNORMAL HIGH (ref 11–307)

## 2019-12-04 LAB — FOLATE: Folate: 22.4 ng/mL (ref >5.9)

## 2019-12-04 LAB — SAVE SMEAR(SSMR), FOR PROVIDER SLIDE REVIEW

## 2019-12-04 NOTE — Progress Notes (Addendum)
[place]  [phone] [phone]     ID: Shirley Mays DOB: 09-Aug-1955  MR#: [id number]  EXN#:[id number]  Patient Care Team: Patient, No Pcp Per as PCP - General (General Practice) Shirley Dock, NP OTHER MD:  CHIEF COMPLAINT: Macrocytic anemia    HISTORY OF CURRENT ILLNESS:  Shirley Mays is a pleasant 64 year old woman who is here today for consultation and evaluation for her macrocytic anemia, and previous leukocytosis, along with mild thromboctyopenia.  Shirley Mays tells me that she does not go undergo routine medical care, and she hasn't seen a health care provider in years.  She says that in mid June she developed lower extremity swelling and some pain and went to the hospital.  She was hospitalized from October 14, 2019 through October 23, 2019 for community acquired pneumonia.  She received IV antibiotics.  She also was found to have cirrhosis and ascites, and a paracentesis was performed that removed 1.2 liters from her abdomen.  She was discharged on Spironolactone and Lasix and her ascites has improved.  Shirley Mays has a h/o ETOH abuse.  She was drinking about 3-5 ETOH beverages per day, typically wine or liquor drinks.  She notes that she has quit drinking since her hospitalization.    Her labs were abnormal during her hospitalization and the trend is noted below.  Most importantly, her WBC is now normal.     Results for Shirley, Mays (MRN [id number]) as of 12/04/2019 13:49  Ref. Range 10/14/2019 23:57 10/15/2019 05:20 10/16/2019 05:00 10/17/2019 09:25 10/18/2019 03:35 10/19/2019 06:35 10/20/2019 03:58 10/21/2019 08:24 10/22/2019 03:40 10/23/2019 03:15 11/19/2019 14:04 12/04/2019 13:09  WBC Latest Ref Range: 4.0 - 10.5 K/uL 17.8 (H) 16.5 (H) 18.2 (H) 17.8 (H) 17.6 (H) 18.0 (H) 20.5 (H) 23.1 (H) 19.1 (H) 19.6 (H) 8.0 7.6  Results for Shirley, Mays (MRN [id number]) as of 12/04/2019 13:49  Ref. Range 10/14/2019 23:57 10/15/2019 05:20 10/16/2019 05:00 10/17/2019 09:25 10/18/2019 03:35  10/19/2019 06:35 10/20/2019 03:58 10/20/2019 20:55 10/21/2019 08:24 10/22/2019 03:40 10/23/2019 03:15 11/19/2019 14:04 12/04/2019 13:09  Hemoglobin Latest Ref Range: 12.0 - 15.0 g/dL 11.1 (L) 10.8 (L) 10.7 (L) 11.0 (L) 10.1 (L) 9.8 (L) 9.6 (L) 10.0 (L) 10.7 (L) 9.6 (L) 10.2 (L) 9.7 (L) 9.6 (L)  Results for Shirley, Mays (MRN [id number]) as of 12/04/2019 13:49  Ref. Range 10/14/2019 23:57 10/15/2019 05:20 10/16/2019 05:00 10/17/2019 09:25 10/18/2019 03:35 10/19/2019 06:35 10/20/2019 03:58 10/21/2019 08:24 10/22/2019 03:40 10/23/2019 03:15 11/19/2019 14:04 12/04/2019 13:09  MCV Latest Ref Range: 80.0 - 100.0 fL 108.7 (H) 108.2 (H) 105.6 (H) 103.4 (H) 104.0 (H) 104.6 (H) 103.9 (H) 106.3 (H) 106.2 (H) 106.6 (H) 104 (H) 105.2 (H)  Results for Shirley, Mays (MRN [id number]) as of 12/04/2019 13:49  Ref. Range 10/14/2019 23:57 10/15/2019 05:20 10/16/2019 05:00 10/17/2019 09:25 10/18/2019 03:35 10/19/2019 06:35 10/20/2019 03:58 10/21/2019 08:24 10/22/2019 03:40 10/23/2019 03:15 11/19/2019 14:04 12/04/2019 13:09  Platelets Latest Ref Range: 150 - 400 K/uL 123 (L) 112 (L) 117 (L) 142 (L) 143 (L) 243 267 350 320 339 154 134 (L)    The patient's subsequent history is as detailed below.  INTERVAL HISTORY: Shirley Mays tells me that she is regaining her strength since she was hospitalized.  She is walking some, and continues to cut back her cigarette smoking.  She does note that she has some mild fatigue, and feeling cold more than usual.  She has no lymphadenopathy, night sweats.  She does endorse an unintentional weight loss of about 40 pounds over  the past year.    REVIEW OF SYSTEMS: Shirley Mays denies any new issues with fever, chills, chest pain, palpitations, cough, shortness of breath, nausea, vomiting, bowel/bladder changes, headaches, or vision issues.  A detailed ROS was otherwise non contributory.    PAST MEDICAL HISTORY: History reviewed. No pertinent past medical history.  PAST SURGICAL HISTORY: Past Surgical History:  Procedure Laterality Date  . IR  PARACENTESIS  10/16/2019  . TUBAL LIGATION      FAMILY HISTORY Family History  Problem Relation Age of Onset  . Cancer Neg Hx   . Anemia Neg Hx   . Leukemia Neg Hx   . Thrombocytopenia Neg Hx     GYNECOLOGIC HISTORY:  Menarche: 64 years old Age at first live birth: 64 years old [place] P 3 LMP post menopausal HRT never  Hysterectomy? no Salpingo-oophorectomy?no    SOCIAL HISTORY: Shirley Mays is single and lives with her duaghter Shirley Mays who works running the office with Dr. Maxie Mays.  She has another son who is a marine and her third son is in [place], [place] working with his father in Sports coach.  She is retired.  She smokes 3 cigarettes per day, but tells me at her heaviest she smoked 1/2 ppd for 30 years.  She denies any drug use, however previously used excess amounts of ETOH.  She has since quit.       ADVANCED DIRECTIVES: Not in place   HEALTH MAINTENANCE: Social History   Tobacco Use  . Smoking status: Current Every Day Smoker    Packs/day: 0.50    Years: 30.00    Pack years: 15.00    Types: Cigarettes  . Smokeless tobacco: Never Used  Substance Use Topics  . Alcohol use: Not Currently    Comment: previously drank heavy about 3-6 drinks/day  . Drug use: Never     Colonoscopy: never  PAP: 10 years ago  Mammo: 10 years ago  Bone density: never   No Known Allergies  Current Outpatient Medications  Medication Sig Dispense Refill  . ASHWAGANDHA PO Take 920 mg by mouth at bedtime.    . cholestyramine (QUESTRAN) 4 g packet Take 1 packet (4 g total) by mouth 3 (three) times daily with meals. For itching 60 each 12  . ciprofloxacin (CIPRO) 500 MG tablet Take 1 tablet (500 mg total) by mouth daily. 90 tablet 1  . furosemide (LASIX) 40 MG tablet Take 1 tablet (40 mg total) by mouth daily. 90 tablet 1  . Multiple Vitamin (MULTIVITAMIN WITH MINERALS) TABS tablet Take 1 tablet by mouth daily.    . sertraline (ZOLOFT) 100 MG tablet Take 100 mg by mouth daily. Pt stated taking it at bedtime      . spironolactone (ALDACTONE) 100 MG tablet Take 1 tablet (100 mg total) by mouth daily. 90 tablet 1  . thiamine 100 MG tablet Take 1 tablet (100 mg total) by mouth daily. 90 tablet 1  . nicotine (NICODERM CQ - DOSED IN MG/24 HOURS) 14 mg/24hr patch Place 1 patch (14 mg total) onto the skin daily. (Patient not taking: Reported on 12/04/2019) 28 patch 0   No current facility-administered medications for this visit.    OBJECTIVE:  Vitals:   12/04/19 1325  BP: 106/61  Pulse: 81  Resp: 18  Temp: 98.7 F (37.1 C)  SpO2: 100%     Body mass index is 23.35 kg/m.   Wt Readings from Last 3 Encounters:  12/04/19 149 lb 1.6 oz (67.6 kg)  11/19/19 147 lb (66.7  kg)  10/22/19 191 lb 9.3 oz (86.9 kg)      ECOG FS:1 - Symptomatic but completely ambulatory GENERAL: Patient is a well appearing female in no acute distress HEENT:  Sclerae anicteric.  Mask in place. Neck is supple.  NODES:  No cervical, supraclavicular, or axillary lymphadenopathy palpated.  BREAST EXAM:  Deferred. LUNGS:  Clear to auscultation bilaterally.  No wheezes or rhonchi. HEART:  Regular rate and rhythm. No murmur appreciated. ABDOMEN:  Soft, nontender.  Positive, normoactive bowel sounds. No organomegaly palpated. MSK:  No focal spinal tenderness to palpation.  EXTREMITIES:  No peripheral edema.   SKIN:  Clear with no obvious rashes or skin changes. No nail dyscrasia. NEURO:  Nonfocal. Well oriented.  Appropriate affect.     LAB RESULTS:     Chemistry    Appointment on 12/04/2019  Component Date Value Ref Range Status  . Sodium 12/04/2019 133* 135 - 145 mmol/L Final  . Potassium 12/04/2019 5.0  3.5 - 5.1 mmol/L Final  . Chloride 12/04/2019 110  98 - 111 mmol/L Final  . CO2 12/04/2019 16* 22 - 32 mmol/L Final  . Glucose, Bld 12/04/2019 79  70 - 99 mg/dL Final   Glucose reference range applies only to samples taken after fasting for at least 8 hours.  . BUN 12/04/2019 20  8 - 23 mg/dL Final  . Creatinine, Ser  12/04/2019 1.17* 0.44 - 1.00 mg/dL Final  . Calcium 12/04/2019 9.7  8.9 - 10.3 mg/dL Final  . Total Protein 12/04/2019 7.8  6.5 - 8.1 g/dL Final  . Albumin 12/04/2019 3.0* 3.5 - 5.0 g/dL Final  . AST 12/04/2019 41  15 - 41 U/L Final  . ALT 12/04/2019 19  0 - 44 U/L Final  . Alkaline Phosphatase 12/04/2019 61  38 - 126 U/L Final  . Total Bilirubin 12/04/2019 2.7* 0.3 - 1.2 mg/dL Final  . GFR calc non Af Amer 12/04/2019 49* >60 mL/min Final  . GFR calc Af Amer 12/04/2019 57* >60 mL/min Final  . Anion gap 12/04/2019 7  5 - 15 Final   Performed at [facility], [place] [address], [place], [place] [address]  . Retic Ct Pct 12/04/2019 2.2  0.4 - 3.1 % Final  . RBC. 12/04/2019 2.67* 3.87 - 5.11 MIL/uL Final  . Retic Count, Absolute 12/04/2019 57.4  19.0 - 186.0 K/uL Final  . Immature Retic Fract 12/04/2019 8.0  2.3 - 15.9 % Final  . Reticulocyte Hemoglobin 12/04/2019 37.9  >27.9 pg Final   Comment:        Given the high negative predictive value of a RET-He result > 32 pg iron deficiency is essentially excluded. If this patient is anemic other etiologies should be considered. Performed at [facility], [place] [address] [place], [place] [address]   . Folate 12/04/2019 22.4  >5.9 ng/mL Final   Performed at [facility] [address] [place], [place] [address]  . Vitamin B-12 12/04/2019 691  180 - 914 pg/mL Final   Comment: (NOTE) This assay is not validated for testing neonatal or myeloproliferative syndrome specimens for Vitamin B12 levels. Performed at [facility], [place] [address], [place], [place] [address]   . Ferritin 12/04/2019 1,379* 11 - 307 ng/mL Final   Performed at [facility], [place] [address], [place], [place] [address]  . Iron 12/04/2019 100  41 - 142 ug/dL Final  . TIBC 12/04/2019 168* 236 - 444  ug/dL Final  . Saturation Ratios 12/04/2019 60* 21 - 57 % Final  .  UIBC 12/04/2019 68* 120 - 384 ug/dL Final   Performed at [facility], [place] [address] [place], [place] [address]  . Smear Review 12/04/2019 SMEAR STAINED AND AVAILABLE FOR REVIEW   Final   Performed at [facility], [address] [place], [place] [address]  . Immature Platelet Fraction 12/04/2019 2.8  1.2 - 8.6 % Final   Performed at [facility], [place] [address] [place], [place] [address]  . WBC Count 12/04/2019 7.6  4.0 - 10.5 K/uL Final  . RBC 12/04/2019 2.70* 3.87 - 5.11 MIL/uL Final  . Hemoglobin 12/04/2019 9.6* 12.0 - 15.0 g/dL Final  . HCT 12/04/2019 28.4* 36 - 46 % Final  . MCV 12/04/2019 105.2* 80.0 - 100.0 fL Final  . MCH 12/04/2019 35.6* 26.0 - 34.0 pg Final  . MCHC 12/04/2019 33.8  30.0 - 36.0 g/dL Final  . RDW 12/04/2019 12.9  11.5 - 15.5 % Final  . Platelet Count 12/04/2019 134* 150 - 400 K/uL Final  . nRBC 12/04/2019 0.0  0.0 - 0.2 % Final  . Neutrophils Relative % 12/04/2019 67  % Final  . Neutro Abs 12/04/2019 5.1  1.7 - 7.7 K/uL Final  . Lymphocytes Relative 12/04/2019 22  % Final  . Lymphs Abs 12/04/2019 1.6  0.7 - 4.0 K/uL Final  . Monocytes Relative 12/04/2019 8  % Final  . Monocytes Absolute 12/04/2019 0.6  0 - 1 K/uL Final  . Eosinophils Relative 12/04/2019 2  % Final  . Eosinophils Absolute 12/04/2019 0.1  0 - 0 K/uL Final  . Basophils Relative 12/04/2019 1  % Final  . Basophils Absolute 12/04/2019 0.1  0 - 0 K/uL Final  . Immature Granulocytes 12/04/2019 0  % Final  . Abs Immature Granulocytes 12/04/2019 0.02  0.00 - 0.07 K/uL Final   Performed at [facility], [place] [address], [place], [place] [address]    Urinalysis   STUDIES: CLINICAL DATA:  64 year old female with with abdominal distension and leukocytosis.  EXAM: CT ABDOMEN AND PELVIS WITH CONTRAST  TECHNIQUE: Multidetector CT imaging of the abdomen and pelvis was performed using the standard  protocol following bolus administration of intravenous contrast.  CONTRAST:  131m OMNIPAQUE IOHEXOL 300 MG/ML  SOLN  COMPARISON:  Right upper quadrant ultrasound dated 10/15/2019.  FINDINGS: Lower chest: Partially visualized small left pleural effusion. There is patchy area of consolidative changes of the left lower lobe with air bronchogram which may represent atelectasis or pneumonia. Clinical correlation is recommended.  No intra-abdominal free air. Small ascites.  Hepatobiliary: Cirrhosis. No intrahepatic biliary ductal dilatation. There is small amount of sludge within the gallbladder. No calcified gallstone.  Pancreas: Unremarkable. No pancreatic ductal dilatation or surrounding inflammatory changes.  Spleen: Indeterminate 2 cm splenic hypodense lesion the the  Adrenals/Urinary Tract: The adrenal glands are unremarkable. There is no hydronephrosis on either side. There is heterogeneous enhancement of the renal parenchyma most consistent with pyelonephritis. Correlation with urinalysis recommended. No drainable fluid collection or abscess. The visualized ureters and urinary bladder appear unremarkable.  Stomach/Bowel: There is sigmoid diverticulosis. There is a small hiatal hernia. There is no bowel obstruction. The appendix is normal.  Vascular/Lymphatic: Mild aortoiliac atherosclerotic disease. The IVC is unremarkable. No portal venous gas. There is no adenopathy.  Reproductive: The uterus and ovaries are grossly unremarkable.  Other: Diffuse subcutaneous edema and anasarca.  Musculoskeletal: No acute or significant osseous findings.  IMPRESSION: 1. Findings most consistent with bilateral pyelonephritis. Correlation with urinalysis recommended. No drainable fluid collection or abscess. 2. Cirrhosis with small ascites and anasarca. 3. Sigmoid diverticulosis. No bowel obstruction. Normal appendix. 4. Aortic Atherosclerosis  (ICD10-I70.0).   Electronically Signed   By: Anner Crete M.D.   On: 10/21/2019 21:57   ASSESSMENT: 65 y.o. woman with no recent medical care, being evaluated today for anemia.  1. Macrocytic Anemia  (a) Likely secondary to ETOH use and cirrhosis  (b) Full lab work pending  2. Mild thrombocytopenia  (a) likely secondary to ETOH use and cirrhosis  PLAN:  Harjit met with myself and Dr. Jana Hakim and we reviewed the reason for her referral and explained to her what is going on in her blood.  We reviewed her blood smear prior to her visit   First, we reviewed that there are three main types of blood cells in the CBC that originate in the bone marrow, these are WBC, Platelets, and RBC.     1. White blood cells are immune cells.  Jazmina's are normal in number and appearance.   2. Platelets are clotting cells.  Treniya's are normal in appearance and slightly decreased.  This is of no clinical concern as they are 136 and likely secondary to her cirrhosis.   3.  Red blood cells are oxygen carrying cells.  Tia's are decreased in number and larger in appearance.  This can be secondary to vitamin deficiency, MDS, certain medications such as Hydrea or HIV meds, or liver disease.  We are checking vitamin levels on [place], however this is likely secondary to cirrhosis from her liver concerns.  We also reviewed that ETOH is a poison to bone marrow, and recommended at most she have one drink a day.  She has decided to quit drinking all together.    Gean also has had many general health maintenance recommendations that have not been completed.  I placed orders for her to see Dr. Havery Moros in GI to have a colonoscopy and discuss her liver disease.  I placed orders for her to undergo a mammogram at the breast center.  I also sent a referral to [place] for her to establish with Dr. Sharlet Salina.  She really should be having annual physicals, and getting routine lab checks, including her  cholesterol as she smokes cigarettes which increases her cardiac risk.    Rosaline has agreed to the above.  We will see her back in 3 months for labs and f/u.  I am hopeful that she will establish care with Dr. Sharlet Salina, undergo GI eval and colonoscopy with Dr. Havery Moros, and have her mammogram at that point.  Should any of the preceding items identify anything that requires more urgent follow up, we are happy to see her sooner.    She knows to call for any questions that may arise between now and her next appointment.  We are happy to see her sooner if needed.  Total encounter time: 60 minutes*  Wilber Bihari, NP 12/05/19 8:07 AM Medical Oncology and Hematology [facility] [place], [place] [address] Tel. [phone]    Fax. [phone]   ADDENDUM: 64 year old woman with a microcytic anemia and mildly low platelet count in the setting of cirrhosis.  There is a history of alcoholism and the total bilirubin was 7.52-monthago, with normal transaminases, down to 2.7 today.  Renal function is adequate.  Her reticulocyte count  is inappropriately normal at 57.4, her folate and B12 were normal, and her ferritin was elevated consistent with it being an acute phase reactant.  Review of the peripheral blood film did not show a macro ovalocytes or hypersegmented neutrophils.  There was no significant dysplasia.  We discussed the fact that alcohol is toxic to the bone marrow and hopefully if she does discontinue drinking she can normalize her hemoglobin.  The slightly low platelet count may be due to increased consumption or to splenic sequestration.  I am hopeful the patient will follow up with her decision to discontinue alcohol and also to update her health maintenance as noted above.  We will do everything we can to facilitate this.  She will see [place] again in 3 months.  She knows to call for any other issues that may develop before then.  I personally saw this patient  and performed a substantive portion of this encounter with the listed APP documented above.   Chauncey Cruel, MD Medical Oncology and Hematology [facility] [address] [place], [place] [address] Tel. [phone]    Fax. [phone]   *Total Encounter Time as defined by the Centers for Medicare and Medicaid Services includes, in addition to the face-to-face time of a patient visit (documented in the note above) non-face-to-face time: obtaining and reviewing outside history, ordering and reviewing medications, tests or procedures, care coordination (communications with other health care professionals or caregivers) and documentation in the medical record.

## 2019-12-08 ENCOUNTER — Telehealth: Payer: Self-pay | Admitting: *Deleted

## 2019-12-08 NOTE — Telephone Encounter (Signed)
-----   Message from Benancio Deeds, MD sent at 12/05/2019  4:51 PM EDT ----- How about 8/18 at 1130? Thanks Dottie ----- Message ----- From: Richardson Chiquito, CMA Sent: 12/05/2019   4:25 PM EDT To: Benancio Deeds, MD  Next available is 02/17/20.... ----- Message ----- From: Benancio Deeds, MD Sent: 12/05/2019  12:55 PM EDT To: Cooper Render, CMA  Hi Jan, Can you call this patient to book her with me in first available opening? If nothing open in the next few weeks let me know and I will add her in somewhere. Thanks  Dr. Mervyn Skeeters ----- Message ----- From: Loa Socks, NP Sent: 12/05/2019   9:17 AM EDT To: Benancio Deeds, MD  Referral made for evaluation of cirrhosis and for colonoscopy.  Thank you,  Lillard Anes, NP

## 2019-12-08 NOTE — Telephone Encounter (Signed)
I have attempted to reach patient to schedule appointment for 12/17/19 but was unsuccessful in reaching her as phone goes straight to a message noting it has "not been set up for voicemail."  I have also attempted to reach her via her emergency contact, Shirley Mays but am told when I call this number that there is nobody by that name that works there. I have left a voicemail on Shirley Mays's home number asking that she have Shirley Mays contact our office.

## 2019-12-10 NOTE — Telephone Encounter (Signed)
I have again attempted to reach patient but number provided goes straight to a message indicating "voicemail has not been set up yet." I will send a letter asking patient to call our office.

## 2019-12-17 ENCOUNTER — Ambulatory Visit (INDEPENDENT_AMBULATORY_CARE_PROVIDER_SITE_OTHER): Payer: Self-pay | Admitting: Gastroenterology

## 2019-12-17 ENCOUNTER — Encounter: Payer: Self-pay | Admitting: Gastroenterology

## 2019-12-17 ENCOUNTER — Other Ambulatory Visit (INDEPENDENT_AMBULATORY_CARE_PROVIDER_SITE_OTHER): Payer: Self-pay

## 2019-12-17 VITALS — BP 118/60 | HR 68 | Ht 67.0 in | Wt 150.0 lb

## 2019-12-17 DIAGNOSIS — Z1211 Encounter for screening for malignant neoplasm of colon: Secondary | ICD-10-CM

## 2019-12-17 DIAGNOSIS — R7989 Other specified abnormal findings of blood chemistry: Secondary | ICD-10-CM

## 2019-12-17 DIAGNOSIS — K7031 Alcoholic cirrhosis of liver with ascites: Secondary | ICD-10-CM

## 2019-12-17 DIAGNOSIS — D649 Anemia, unspecified: Secondary | ICD-10-CM

## 2019-12-17 LAB — COMPREHENSIVE METABOLIC PANEL
ALT: 25 U/L (ref 0–35)
AST: 51 U/L — ABNORMAL HIGH (ref 0–37)
Albumin: 3.7 g/dL (ref 3.5–5.2)
Alkaline Phosphatase: 69 U/L (ref 39–117)
BUN: 19 mg/dL (ref 6–23)
CO2: 16 mEq/L — ABNORMAL LOW (ref 19–32)
Calcium: 10.2 mg/dL (ref 8.4–10.5)
Chloride: 107 mEq/L (ref 96–112)
Creatinine, Ser: 1.33 mg/dL — ABNORMAL HIGH (ref 0.40–1.20)
GFR: 40.09 mL/min — ABNORMAL LOW (ref >60.00)
Glucose, Bld: 97 mg/dL (ref 70–99)
Potassium: 6 mEq/L — ABNORMAL HIGH (ref 3.5–5.1)
Sodium: 130 mEq/L — ABNORMAL LOW (ref 135–145)
Total Bilirubin: 2.3 mg/dL — ABNORMAL HIGH (ref 0.2–1.2)
Total Protein: 9 g/dL — ABNORMAL HIGH (ref 6.0–8.3)

## 2019-12-17 LAB — PROTIME-INR
INR: 1.6 ratio — ABNORMAL HIGH (ref 0.8–1.0)
Prothrombin Time: 17.7 s — ABNORMAL HIGH (ref 9.6–13.1)

## 2019-12-17 LAB — TSH: TSH: 3.6 u[IU]/mL (ref 0.35–4.50)

## 2019-12-17 NOTE — Progress Notes (Signed)
HPI :  64 year old female with recently diagnosed cirrhosis, history of pneumonia, anemia, history of alcohol use, referred by Lillard Anes NP of Hematology for evaluation and management of cirrhosis.   The patient was admitted in late June for pneumonia.  During that time she had a CT scan obtained of the abdomen and pelvis which showed bilateral pyelonephritis, she was also incidentally noted to have cirrhosis with ascites and anasarca.  She underwent paracentesis which showed albumin undetectable, total protein undetectable, no evidence of SBP.  Cytology negative.  She had initial blood work done in the hospital which was negative for hepatitis C antibody, negative for hepatitis B surface antigen and hepatitis B core antibody.  She was noted to have thrombocytopenia and anemia with macrocytosis and associated folate deficiency.  B12 levels normal.  She was seen by hematology who thought her hematologic abnormalities are likely related to underlying cirrhosis.  She had iron studies done showing an iron saturation of 60% and markedly elevated ferritin of 1379.  During her hospitalization she had a bilirubinemia that peaked to the eights, has since down trended.  She also had an AST over ALT predominance with hypoalbuminemia.  Alkaline phosphatase was normal.  CT scan showed no evidence of biliary ductal dilation.  Ultrasound shows stones in the gallbladder.  She endorses longstanding alcohol use, sounds like she would drink anywhere from 3-5 drinks in a day, she states usually higher volume on the weekend and perhaps just a few drinks during the week of the hard for her to clarify.  She states she has drank alcohol for a very long time.  Her mother had cirrhosis of the liver she thinks also due to alcohol use.  She denies any other family history of liver disease or HCC.  She was discharged from the hospital on Lasix 40 mg a day and Aldactone 100 mg a day.  She states she takes the Aldactone routinely  but the Lasix every other day or so as it makes her urinate excessively.  She states she has no lower extremity edema that bothers her, the ascites is not controlled, she feels like her "skin is drying out" from the diuretics.  She was counseled on a low-sodium diet and has been trying to maintain that as best she can.  She states since she has been discharged from the hospital she has not drink any alcohol at all, states 100% abstinent.  She denies any abdominal pains.  She thinks her jaundice has resolved.  She eats well without any nausea or vomiting.  She denies any blood in the stools.  No family history of colon cancer.  She has never had a prior colonoscopy.  We discussed what cirrhosis is, risks for further decompensation / HCC, and long-term management of cirrhosis.  CT scan abdomen / pelvis 10/21/19 - IMPRESSION: 1. Findings most consistent with bilateral pyelonephritis. Correlation with urinalysis recommended. No drainable fluid collection or abscess. 2. Cirrhosis with small ascites and anasarca. 3. Sigmoid diverticulosis. No bowel obstruction. Normal appendix. 4. Aortic Atherosclerosis (ICD10-I70.0).  [place] 10/15/19:  IMPRESSION: Layering sludge/stones. No definite evidence of acute cholecystitis.  Findings suggestive of cirrhosis  Small amount of perihepatic ascites.   paracenesis - 590 WBC with 33% neutrophils, cytology negative, alb < 1.0, t prot < 3.0   B12 500 Folate 5.9 (low) now 22.4 HCV AB (-) Hep B surface antigen (-) Hep B core AB (-)  Iron sat 60% Ferritin 1379    Past Medical History:  Diagnosis  Date  . Alcohol abuse   . Cirrhosis (HCC)   . Hiatal hernia   . Pneumonia   . Pyelonephritis   . Sigmoid diverticulosis      Past Surgical History:  Procedure Laterality Date  . IR PARACENTESIS  10/16/2019  . TUBAL LIGATION     Family History  Problem Relation Age of Onset  . Liver disease Mother   . Kidney disease Mother   . Heart disease Mother    . Cancer Neg Hx   . Anemia Neg Hx   . Leukemia Neg Hx   . Thrombocytopenia Neg Hx    Social History   Tobacco Use  . Smoking status: Current Every Day Smoker    Packs/day: 0.50    Years: 30.00    Pack years: 15.00    Types: Cigarettes  . Smokeless tobacco: Never Used  . Tobacco comment: 2-3 a day  Vaping Use  . Vaping Use: Never used  Substance Use Topics  . Alcohol use: Not Currently    Comment: previously drank heavy about 3-6 drinks/day  . Drug use: Never   Current Outpatient Medications  Medication Sig Dispense Refill  . ASHWAGANDHA PO Take 920 mg by mouth at bedtime.    . cholestyramine (QUESTRAN) 4 g packet Take 1 packet (4 g total) by mouth 3 (three) times daily with meals. For itching 60 each 12  . ciprofloxacin (CIPRO) 500 MG tablet Take 500 mg by mouth daily with breakfast.    . furosemide (LASIX) 40 MG tablet Take 1 tablet (40 mg total) by mouth daily. 90 tablet 1  . Multiple Vitamin (MULTIVITAMIN WITH MINERALS) TABS tablet Take 1 tablet by mouth daily.    . sertraline (ZOLOFT) 100 MG tablet Take 100 mg by mouth daily. Pt stated taking it at bedtime    . spironolactone (ALDACTONE) 100 MG tablet Take 1 tablet (100 mg total) by mouth daily. 90 tablet 1  . thiamine 100 MG tablet Take 1 tablet (100 mg total) by mouth daily. 90 tablet 1   No current facility-administered medications for this visit.   No Known Allergies   Review of Systems: All systems reviewed and negative except where noted in HPI.   Lab Results  Component Value Date   WBC 7.6 12/04/2019   HGB 9.6 (L) 12/04/2019   HCT 28.4 (L) 12/04/2019   MCV 105.2 (H) 12/04/2019   PLT 134 (L) 12/04/2019    Lab Results  Component Value Date   CREATININE 1.17 (H) 12/04/2019   BUN 20 12/04/2019   NA 133 (L) 12/04/2019   K 5.0 12/04/2019   CL 110 12/04/2019   CO2 16 (L) 12/04/2019    Lab Results  Component Value Date   ALT 19 12/04/2019   AST 41 12/04/2019   ALKPHOS 61 12/04/2019   BILITOT 2.7  (H) 12/04/2019   Lab Results  Component Value Date   INR 1.9 (H) 10/23/2019   INR 2.2 (H) 10/20/2019   INR 2.1 (H) 10/16/2019    Lab Results  Component Value Date   IRON 100 12/04/2019   TIBC 168 (L) 12/04/2019   FERRITIN 1,379 (H) 12/04/2019     Physical Exam: BP 118/60   Pulse 68   Ht 5\' 7"  (1.702 m)   Wt 150 lb (68 kg)   BMI 23.49 kg/m  Constitutional: Pleasant,well-developed, female in no acute distress. HEENT: Normocephalic and atraumatic.  Neck supple.  Cardiovascular: Normal rate, regular rhythm.  Pulmonary/chest: Effort normal and breath sounds normal.  No wheezing, rales or rhonchi. Abdominal: Soft, nondistended, nontender.There are no masses palpable.  Extremities: no edema Lymphadenopathy: No cervical adenopathy noted. Neurological: Alert and oriented to person place and time. Skin: Skin is warm and dry. No rashes noted. Psychiatric: Normal mood and affect. Behavior is normal.   ASSESSMENT AND PLAN: 64 year old female here for new patient assessment of the following:  Cirrhosis with ascites / jaundice - incidentally noted during recent admission to the hospital.  With her bilirubinemia and leukocytosis quite possible she had a component of alcoholic hepatitis.  Over time her LFTs and white blood cell count have come down nicely in the setting of abstaining from alcohol.  I discussed with her what cirrhosis is, potential etiologies, risks for further decompensation and HCC.  She has decompensated cirrhosis and is coagulopathic, I discussed with her risks of further decompensation. Liver transplant is the only cure for this if she deteriorates over time. I suspect her cirrhosis is likely from alcohol use, however given her elevated iron indices we will need to screen her for hereditary hemochromatosis (although elevated ferritin could be acute phase reactant in the setting of alcoholic hepatitis).  I will also complete her serologic work-up for other etiologies of  chronic liver disease such as autoimmune hepatitis.  I will screen her for immunity to hepatitis B and recommend vaccination for these if needed.  Moving forward she needs HCC screening every 6 months, next due for an ultrasound and December.  We will check AFP levels today.  I will recheck her renal function to ensure stable on diuretics, if they are we can consider titrating down given she has no edema or ascites on exam today.  Will repeat LFTs as well.  I discussed potential for esophageal varices and recommend upper endoscopy to screen for varices.  I discussed risks and benefits of endoscopy and anesthesia with her and she wanted to proceed with this.  If she has varices and her ascites is controlled we will start nonselective beta blockade.  Moving forward recommend complete alcohol abstinence and discussed with her the importance of this.  I also recommend low-sodium diet and she will try to comply with that as well.  I will see her again in 3 months if not sooner for any complicating issues.  I will let her know the results of labs with further recommendations as well as await results of her EGD.  Anemia - probably due to alcohol/cirrhosis, also had a folate deficiency which has corrected.  Recommend continued alcohol abstinence.    Colon cancer screening -she is due for routine colon cancer screening.  Especially in light of her anemia, optical colonoscopy is recommended.  I discussed risk and benefits.  She states she wants to think about this more, we had a discussion about her concerns about colonoscopy, I answered them to the best of my ability and again strongly recommended this.  I told her we can do this at the same time as her upper endoscopy but she did not want to schedule colonoscopy at this time.  I asked her to call us to schedule when she is comfortable proceeding.  Elevated ferritin - as above, will screen for hemochromatosis with genetic testing but suspect this could be an acute  phase reactant due to alcohol use / alcoholic hepatitis  Ileene Patrick, MD [facility]

## 2019-12-17 NOTE — Patient Instructions (Addendum)
If you are age 64 or older, your body mass index should be between 23-30. Your Body mass index is 23.49 kg/m. If this is out of the aforementioned range listed, please consider follow up with your Primary Care Provider.  If you are age 36 or younger, your body mass index should be between 19-25. Your Body mass index is 23.49 kg/m. If this is out of the aformentioned range listed, please consider follow up with your Primary Care Provider.   Please go to the lab in the basement of our building to have lab work done as you leave today. Hit "B" for basement when you get on the elevator.  When the doors open the lab is on your left.  We will call you with the results. Thank you.  You have been scheduled for an endoscopy. Please follow written instructions given to you at your visit today. If you use inhalers (even only as needed), please bring them with you on the day of your procedure.  You will be due for an Ultrasound in December 2021.  We will contact you when it is time to schedule.  You will be due for an office visit in 3 months.  We will let you know when it it time to schedule your appointment.  Thank you for entrusting me with your care and for choosing [facility], Dr. Ileene Patrick

## 2019-12-18 ENCOUNTER — Other Ambulatory Visit: Payer: Self-pay

## 2019-12-18 ENCOUNTER — Other Ambulatory Visit: Payer: Self-pay | Admitting: Gastroenterology

## 2019-12-18 DIAGNOSIS — K7460 Unspecified cirrhosis of liver: Secondary | ICD-10-CM

## 2019-12-18 DIAGNOSIS — R188 Other ascites: Secondary | ICD-10-CM

## 2019-12-19 ENCOUNTER — Other Ambulatory Visit (INDEPENDENT_AMBULATORY_CARE_PROVIDER_SITE_OTHER): Payer: Self-pay

## 2019-12-19 DIAGNOSIS — K7460 Unspecified cirrhosis of liver: Secondary | ICD-10-CM

## 2019-12-19 DIAGNOSIS — R188 Other ascites: Secondary | ICD-10-CM

## 2019-12-19 LAB — BASIC METABOLIC PANEL
BUN: 20 mg/dL (ref 6–23)
CO2: 18 mEq/L — ABNORMAL LOW (ref 19–32)
Calcium: 9.3 mg/dL (ref 8.4–10.5)
Chloride: 107 mEq/L (ref 96–112)
Creatinine, Ser: 1.29 mg/dL — ABNORMAL HIGH (ref 0.40–1.20)
GFR: 41.53 mL/min — ABNORMAL LOW (ref >60.00)
Glucose, Bld: 106 mg/dL — ABNORMAL HIGH (ref 70–99)
Potassium: 4.2 mEq/L (ref 3.5–5.1)
Sodium: 133 mEq/L — ABNORMAL LOW (ref 135–145)

## 2019-12-19 MED FILL — FUROSEMIDE 40 MG TAB: 40 | 30 days supply | Qty: 30 | Fill #0

## 2019-12-26 ENCOUNTER — Other Ambulatory Visit: Payer: Self-pay

## 2019-12-26 DIAGNOSIS — K7460 Unspecified cirrhosis of liver: Secondary | ICD-10-CM

## 2019-12-26 DIAGNOSIS — Z23 Encounter for immunization: Secondary | ICD-10-CM

## 2019-12-26 DIAGNOSIS — R772 Abnormality of alphafetoprotein: Secondary | ICD-10-CM

## 2019-12-29 ENCOUNTER — Ambulatory Visit (INDEPENDENT_AMBULATORY_CARE_PROVIDER_SITE_OTHER): Payer: Self-pay | Admitting: Gastroenterology

## 2019-12-29 ENCOUNTER — Other Ambulatory Visit (INDEPENDENT_AMBULATORY_CARE_PROVIDER_SITE_OTHER): Payer: Self-pay

## 2019-12-29 DIAGNOSIS — R188 Other ascites: Secondary | ICD-10-CM

## 2019-12-29 DIAGNOSIS — K7460 Unspecified cirrhosis of liver: Secondary | ICD-10-CM

## 2019-12-29 DIAGNOSIS — R772 Abnormality of alphafetoprotein: Secondary | ICD-10-CM

## 2019-12-29 DIAGNOSIS — Z23 Encounter for immunization: Secondary | ICD-10-CM

## 2019-12-29 LAB — ANTI-NUCLEAR AB-TITER (ANA TITER): ANA Titer 1: 1:40 {titer} — ABNORMAL HIGH

## 2019-12-29 LAB — BASIC METABOLIC PANEL
BUN: 14 mg/dL (ref 6–23)
CO2: 19 mEq/L (ref 19–32)
Calcium: 8.5 mg/dL (ref 8.4–10.5)
Chloride: 107 mEq/L (ref 96–112)
Creatinine, Ser: 1.03 mg/dL (ref 0.40–1.20)
GFR: 53.84 mL/min — ABNORMAL LOW (ref >60.00)
Glucose, Bld: 163 mg/dL — ABNORMAL HIGH (ref 70–99)
Potassium: 3 mEq/L — ABNORMAL LOW (ref 3.5–5.1)
Sodium: 135 mEq/L (ref 135–145)

## 2019-12-29 LAB — HEPATITIS A ANTIBODY, TOTAL: Hepatitis A AB,Total: NONREACTIVE

## 2019-12-29 LAB — HEPATITIS B SURFACE ANTIBODY,QUALITATIVE: Hep B S Ab: NONREACTIVE

## 2019-12-29 LAB — ALPHA-1-ANTITRYPSIN: A-1 Antitrypsin, Ser: 227 mg/dL — ABNORMAL HIGH (ref 83–199)

## 2019-12-29 LAB — ANTI-SMOOTH MUSCLE ANTIBODY, IGG: Actin (Smooth Muscle) Antibody (IGG): 26 U — ABNORMAL HIGH (ref <20)

## 2019-12-29 LAB — ANA: Anti Nuclear Antibody (ANA): POSITIVE — AB

## 2019-12-29 LAB — HEMOCHROMATOSIS DNA-PCR(C282Y,H63D)

## 2019-12-29 LAB — AFP TUMOR MARKER: AFP-Tumor Marker: 17 ng/mL — ABNORMAL HIGH

## 2019-12-29 LAB — IGG: IgG (Immunoglobin G), Serum: 2387 mg/dL — ABNORMAL HIGH (ref 600–1540)

## 2019-12-29 LAB — MITOCHONDRIAL ANTIBODIES: Mitochondrial M2 Ab, IgG: <=20 U

## 2019-12-29 NOTE — Progress Notes (Signed)
Pt rec'd 1st of 3 Twinrix injections. Next due 01-29-20. Pt has appt.

## 2019-12-30 ENCOUNTER — Other Ambulatory Visit: Payer: Self-pay

## 2019-12-30 DIAGNOSIS — E876 Hypokalemia: Secondary | ICD-10-CM

## 2020-01-02 ENCOUNTER — Other Ambulatory Visit: Payer: Self-pay

## 2020-01-02 ENCOUNTER — Inpatient Hospital Stay: Payer: Self-pay | Attending: Adult Health | Admitting: Medical

## 2020-01-02 ENCOUNTER — Ambulatory Visit (HOSPITAL_COMMUNITY)
Admission: RE | Admit: 2020-01-02 | Discharge: 2020-01-02 | Disposition: A | Discharge status: Home / Self Care | Payer: Self-pay | Source: Ambulatory Visit | Attending: Adult Health | Admitting: Adult Health

## 2020-01-02 ENCOUNTER — Ambulatory Visit (HOSPITAL_COMMUNITY)
Admission: RE | Admit: 2020-01-02 | Discharge: 2020-01-02 | Disposition: A | Discharge status: Home / Self Care | Payer: Self-pay | Source: Ambulatory Visit | Attending: Gastroenterology | Admitting: Gastroenterology

## 2020-01-02 ENCOUNTER — Encounter: Payer: Self-pay | Admitting: Medical

## 2020-01-02 ENCOUNTER — Other Ambulatory Visit: Payer: Self-pay | Admitting: Adult Health

## 2020-01-02 ENCOUNTER — Encounter: Payer: Self-pay | Admitting: Adult Health

## 2020-01-02 VITALS — BP 106/58 | HR 70 | Temp 97.4°F | Resp 18 | Ht 67.0 in | Wt 166.3 lb

## 2020-01-02 DIAGNOSIS — K7460 Unspecified cirrhosis of liver: Secondary | ICD-10-CM | POA: Insufficient documentation

## 2020-01-02 DIAGNOSIS — Z79899 Other long term (current) drug therapy: Secondary | ICD-10-CM | POA: Insufficient documentation

## 2020-01-02 DIAGNOSIS — Z9181 History of falling: Secondary | ICD-10-CM | POA: Insufficient documentation

## 2020-01-02 DIAGNOSIS — I517 Cardiomegaly: Secondary | ICD-10-CM | POA: Insufficient documentation

## 2020-01-02 DIAGNOSIS — W19XXXA Unspecified fall, initial encounter: Secondary | ICD-10-CM

## 2020-01-02 DIAGNOSIS — F17210 Nicotine dependence, cigarettes, uncomplicated: Secondary | ICD-10-CM | POA: Insufficient documentation

## 2020-01-02 DIAGNOSIS — M7989 Other specified soft tissue disorders: Secondary | ICD-10-CM | POA: Insufficient documentation

## 2020-01-02 DIAGNOSIS — R772 Abnormality of alphafetoprotein: Secondary | ICD-10-CM | POA: Insufficient documentation

## 2020-01-02 DIAGNOSIS — S2020XA Contusion of thorax, unspecified, initial encounter: Secondary | ICD-10-CM

## 2020-01-02 DIAGNOSIS — D649 Anemia, unspecified: Secondary | ICD-10-CM | POA: Insufficient documentation

## 2020-01-02 DIAGNOSIS — I700 Atherosclerosis of aorta: Secondary | ICD-10-CM | POA: Insufficient documentation

## 2020-01-02 DIAGNOSIS — D696 Thrombocytopenia, unspecified: Secondary | ICD-10-CM | POA: Insufficient documentation

## 2020-01-02 DIAGNOSIS — R918 Other nonspecific abnormal finding of lung field: Secondary | ICD-10-CM | POA: Insufficient documentation

## 2020-01-02 DIAGNOSIS — R188 Other ascites: Secondary | ICD-10-CM | POA: Insufficient documentation

## 2020-01-02 DIAGNOSIS — R0781 Pleurodynia: Secondary | ICD-10-CM | POA: Insufficient documentation

## 2020-01-02 MED ORDER — HYDROCODONE-ACETAMINOPHEN 5-325 MG PO TABS
ORAL_TABLET | ORAL | 0 refills | Status: DC
Start: 2020-01-02 — End: 2020-08-18

## 2020-01-02 MED ORDER — GADOBUTROL 1 MMOL/ML IV SOLN
6.0000 mL | Freq: Once | INTRAVENOUS | Status: AC | PRN
  Administered 2020-01-02: 6 mL via INTRAVENOUS

## 2020-01-02 NOTE — Patient Instructions (Signed)

## 2020-01-02 NOTE — Progress Notes (Signed)
I was walking into work this morning, and I saw Shirley Mays across the [facility] entrance fall.  She did not hit her head or lose consciousness.  She did hit her right chest wall on the concrete/railing between the sidewalk and [facility].  She also hit her right arm on the railing.  She did not lose consciousness.    We got a wheelchair and brought her into an exam room at [facility].  She was at the campus for MRI of the abdomen w wo contrast. She notes she did not eat since midnight and feels slightly light headed, hungry, and tired from this.    VS: 117/52, T 97.6, HR 81, RR 20, 100%on room air She is in no apparent distress Cardiac RRR Lungs are clear Right chest wall with some erythema and mild swelling Right arm with some erythema, and mild scraping   Due to her fall, I personally took Shirley Mays from [facility] to MRI, and handed her off to [facility] the Lexicographer.  I explained to Shirley Mays what had happened and gave Shirley Mays my cell phone to call once Shirley Mays had finished with her MRI.  Shirley Mays called and noted Shirley Mays was feeling well. I had placed orders for right rib xrays.  Shirley Mays brought the patient to radiology and once her xrays were complete I arranged for Shirley Mays to be re evaluated by Shirley Mays in [facility] clinic.    Shirley Anes, NP

## 2020-01-07 ENCOUNTER — Other Ambulatory Visit: Payer: Self-pay

## 2020-01-07 ENCOUNTER — Encounter: Payer: Self-pay | Admitting: Family Medicine

## 2020-01-07 ENCOUNTER — Ambulatory Visit: Payer: Self-pay | Attending: Family Medicine | Admitting: Family Medicine

## 2020-01-07 VITALS — BP 101/55 | HR 85 | Ht 67.0 in | Wt 177.4 lb

## 2020-01-07 DIAGNOSIS — R251 Tremor, unspecified: Secondary | ICD-10-CM

## 2020-01-07 DIAGNOSIS — K7031 Alcoholic cirrhosis of liver with ascites: Secondary | ICD-10-CM

## 2020-01-07 DIAGNOSIS — R600 Localized edema: Secondary | ICD-10-CM

## 2020-01-07 NOTE — Progress Notes (Signed)
Ammonia level needs to be checked.  Her nurse states that she has gained weight.  Had MRI of Abdomen yesterday.  Had fall yesterday and has pain on right side of abdomen.

## 2020-01-07 NOTE — Patient Instructions (Signed)
Cirrhosis  Cirrhosis is long-term (chronic) liver injury. The liver is the body's largest internal organ, and it performs many functions. It converts food into energy, removes toxic material from the blood, makes important proteins, and absorbs necessary vitamins from food. In cirrhosis, healthy liver cells are replaced by scar tissue. This prevents blood from flowing through the liver, making it difficult for the liver to function. Scarring of the liver cannot be reversed, but treatment can prevent it from getting worse. What are the causes? Common causes of this condition are hepatitis C and long-term alcohol abuse. Other causes include:  Nonalcoholic fatty liver disease. This happens when fat is deposited in the liver by causes other than alcohol.  Hepatitis B infection.  Autoimmune hepatitis. In this condition, the body's defense system (immune system) mistakenly attacks the liver cells, causing irritation and swelling (inflammation).  Diseases that cause blockage of ducts inside the liver.  Inherited liver diseases, such as hemochromatosis. This is one of the most common inherited liver diseases. In this disease, deposits of iron collect in the liver and other organs.  Reactions to certain long-term medicines, such as amiodarone, a heart medicine.  Parasitic infections. These include schistosomiasis, which is caused by a flatworm.  Long-term contact to certain toxins. These toxins include certain organic solvents, such as toluene and chloroform. What increases the risk? You are more likely to develop this condition if:  You have certain types of viral hepatitis.  You abuse alcohol, especially if you are female.  You are overweight.  You share needles.  You have unprotected sex with someone who has viral hepatitis. What are the signs or symptoms? You may not have any signs and symptoms at first. Symptoms may not develop until the damage to your liver starts to get worse. Early  symptoms may include:  Weakness and tiredness (fatigue).  Changes in sleep patterns or having trouble sleeping.  Itchiness.  Tenderness in the right-upper part of your abdomen.  Weight loss and muscle loss.  Nausea.  Loss of appetite.  Appearance of tiny blood vessels under the skin. Later symptoms may include:  Fatigue or weakness that is getting worse.  Yellow skin and eyes (jaundice).  Buildup of fluid in the abdomen (ascites). You may notice that your clothes are tight around your waist.  Weight gain.  Swelling of the feet and ankles (edema).  Trouble breathing.  Easy bruising and bleeding.  Vomiting blood.  Black or bloody stool.  Mental confusion. How is this diagnosed? Your health care provider may suspect cirrhosis based on your symptoms and medical history, especially if you have other medical conditions or a history of alcohol abuse. Your health care provider will do a physical exam to feel your liver and to check for signs of cirrhosis. He or she may perform other tests, including:  Blood tests to check: ? For hepatitis B or C. ? Kidney function. ? Liver function.  Imaging tests such as: ? MRI or CT scan to look for changes seen in advanced cirrhosis. ? Ultrasound to see if normal liver tissue is being replaced by scar tissue.  A procedure in which a long needle is used to take a sample of liver tissue to be checked in a lab (biopsy). Liver biopsy can confirm the diagnosis of cirrhosis. How is this treated? Treatment for this condition depends on how damaged your liver is and what caused the damage. It may include treating the symptoms of cirrhosis, or treating the underlying causes in order to   slow the damage. Treatment may include:  Making lifestyle changes, such as: ? Eating a healthy diet. You may need to work with your health care provider or a diet and nutrition specialist (dietitian) to develop an eating plan. ? Restricting salt  intake. ? Maintaining a healthy weight. ? Not abusing drugs or alcohol.  Taking medicines to: ? Treat liver infections or other infections. ? Control itching. ? Reduce fluid buildup. ? Reduce certain blood toxins. ? Reduce risk of bleeding from enlarged blood vessels in the stomach or esophagus (varices).  Liver transplant. In this procedure, a liver from a donor is used to replace your diseased liver. This is done if cirrhosis has caused liver failure. Other treatments and procedures may be done depending on the problems that you get from cirrhosis. Common problems include liver-related kidney failure (hepatorenal syndrome). Follow these instructions at home:   Take medicines only as told by your health care provider. Do not use medicines that are toxic to your liver. Ask your health care provider before taking any new medicines, including over-the-counter medicines.  Rest as needed.  Eat a well-balanced diet. Ask your health care provider or dietitian for more information.  Limit your salt or water intake, if your health care provider asks you to do this.  Do not drink alcohol. This is especially important if you are taking acetaminophen.  Keep all follow-up visits as told by your health care provider. This is important. Contact a health care provider if you:  Have fatigue or weakness that is getting worse.  Develop swelling of the hands, feet, legs, or face.  Have a fever.  Develop loss of appetite.  Have nausea or vomiting.  Develop jaundice.  Develop easy bruising or bleeding. Get help right away if you:  Vomit bright red blood or a material that looks like coffee grounds.  Have blood in your stools.  Notice that your stools appear black and tarry.  Become confused.  Have chest pain or trouble breathing. Summary  Cirrhosis is chronic liver injury. Liver damage cannot be reversed. Common causes are hepatitis C and long-term alcohol abuse.  Tests used to  diagnose cirrhosis include blood tests, imaging tests, and liver biopsy.  Treatment for this condition involves treating the underlying cause. Avoid alcohol, drugs, salt, and medicines that may damage your liver.  Contact your health care provider if you develop ascites, edema, jaundice, fever, nausea or vomiting, easy bruising or bleeding, or worsening fatigue. This information is not intended to replace advice given to you by your health care provider. Make sure you discuss any questions you have with your health care provider. Document Revised: 08/07/2018 Document Reviewed: 03/07/2017 Elsevier Patient Education  2020 [employer]  

## 2020-01-07 NOTE — Progress Notes (Signed)
Subjective:  Patient ID: Shirley Mays, female    DOB: [birth year]  Age: 64 y.o. MRN: [id number]  CC: Establish Care   HPI Shirley Mays is a 64 year old female with a history of alcohol abuse, alcoholic cirrhosis of the liver with ascites (diagnosed after a hospitalization in 09/2019 for sepsis), thrombocytopenia. CT abdomen revealed: IMPRESSION: 1. Findings most consistent with bilateral pyelonephritis. Correlation with urinalysis recommended. No drainable fluid collection or abscess. 2. Cirrhosis with small ascites and anasarca. 3. Sigmoid diverticulosis. No bowel obstruction. Normal appendix. 4. Aortic Atherosclerosis (ICD10-I70.0).   After discharge she had a hospital follow up visit with the physician assistant and and has also followed up oncology who attributed her Anemia and leukocytosis to alcohol use and cirrhosis She has also followed up with GI and is currently on spironolactone, Lasix, cholestyramine, thiamine, MRI abdomen also ordered by GI.  MRI abdomen revealed: IMPRESSION: 1. Cirrhosis and portal venous hypertension, without evidence of hepatocellular carcinoma. 2. Trace abdominal ascites. 3.  Aortic Atherosclerosis (ICD10-I70.0). She had a fall after she slipped 5 days ago and complains of pain in the right side of her abdomen and every time she pushes up it hurts. She received Norco from Hematology. She was advised to cut back on both Spironolactone and Furosemide to 50 and 20 mg by GI and noticed she has gained 27 lbs in the last 3 weeks  She is not quite as sharp as prior to her hospitalization as per her daughter.  Has not drank alcohol since her hospitalization Zoloft prescribed by an outside Clinician and on further questioning about it she states she has trouble sleeping but denies any history of Bipolar disorder. Daughter informs me that patient had said her brain goes  'a million miles an hour'. Past Medical History:  Diagnosis Date  . Alcohol abuse   .  Cirrhosis (HCC)   . Hiatal hernia   . Pneumonia   . Pyelonephritis   . Sigmoid diverticulosis     Past Surgical History:  Procedure Laterality Date  . IR PARACENTESIS  10/16/2019  . TUBAL LIGATION      Family History  Problem Relation Age of Onset  . Liver disease Mother   . Kidney disease Mother   . Heart disease Mother   . Cancer Neg Hx   . Anemia Neg Hx   . Leukemia Neg Hx   . Thrombocytopenia Neg Hx     No Known Allergies  Outpatient Medications Prior to Visit  Medication Sig Dispense Refill  . ASHWAGANDHA PO Take 920 mg by mouth at bedtime.    . cholestyramine (QUESTRAN) 4 g packet Take 1 packet (4 g total) by mouth 3 (three) times daily with meals. For itching 60 each 12  . ciprofloxacin (CIPRO) 500 MG tablet Take 500 mg by mouth daily with breakfast.    . furosemide (LASIX) 40 MG tablet Take 1 tablet (40 mg total) by mouth daily. 90 tablet 1  . HYDROcodone-acetaminophen (NORCO) 5-325 MG tablet 1/2 to 1 tablet every 4 hours prn pain 30 tablet 0  . Multiple Vitamin (MULTIVITAMIN WITH MINERALS) TABS tablet Take 1 tablet by mouth daily.    . sertraline (ZOLOFT) 100 MG tablet Take 100 mg by mouth daily. Pt stated taking it at bedtime    . spironolactone (ALDACTONE) 100 MG tablet Take 1 tablet (100 mg total) by mouth daily. 90 tablet 1  . thiamine 100 MG tablet Take 1 tablet (100 mg total) by mouth daily. 90 tablet 1  No facility-administered medications prior to visit.     ROS Review of Systems  Constitutional: Positive for unexpected weight change. Negative for activity change, appetite change and fatigue.  HENT: Negative for congestion, sinus pressure and sore throat.   Eyes: Negative for visual disturbance.  Respiratory: Negative for cough, chest tightness, shortness of breath and wheezing.   Cardiovascular: Positive for leg swelling. Negative for chest pain and palpitations.  Gastrointestinal: Negative for abdominal distention, abdominal pain and constipation.    Endocrine: Negative for polydipsia.  Genitourinary: Negative for dysuria and frequency.  Musculoskeletal: Negative for arthralgias and back pain.  Skin: Negative for rash.  Neurological: Positive for tremors. Negative for light-headedness and numbness.  Hematological: Does not bruise/bleed easily.  Psychiatric/Behavioral: Negative for agitation and behavioral problems.    Objective:  BP (!) 101/55   Pulse 85   Ht 5\' 7"  (1.702 m)   Wt 177 lb 6.4 oz (80.5 kg)   SpO2 99%   BMI 27.78 kg/m   BP/Weight 01/07/2020 01/02/2020 12/17/2019  Systolic BP 101 106 118  Diastolic BP 55 58 60  Wt. (Lbs) 177.4 166.3 150  BMI 27.78 26.05 23.49      Physical Exam Constitutional:      Appearance: She is well-developed.  Neck:     Vascular: No JVD.  Cardiovascular:     Rate and Rhythm: Normal rate.     Heart sounds: Normal heart sounds. No murmur heard.   Pulmonary:     Effort: Pulmonary effort is normal.     Breath sounds: Normal breath sounds. No wheezing or rales.  Chest:     Chest wall: No tenderness.  Abdominal:     General: Bowel sounds are normal. There is no distension.     Palpations: Abdomen is soft. There is no mass.     Tenderness: There is abdominal tenderness (TTP of entire right side of abdomen).  Musculoskeletal:        General: Normal range of motion.     Right lower leg: Edema (3+) present.     Left lower leg: Edema (3+) present.  Skin:    Comments: Right upper arm bruise  Neurological:     Mental Status: She is alert and oriented to person, place, and time.     Comments: Positive for tremors  Psychiatric:        Mood and Affect: Mood normal.     CMP Latest Ref Rng & Units 12/29/2019 12/19/2019 12/17/2019  Glucose 70 - 99 mg/dL 12/19/2019) 161(W) 97  BUN 6 - 23 mg/dL 14 20 19   Creatinine 0.40 - 1.20 mg/dL 960(A ) 5.40)  Sodium 135 - 145 mEq/L 135 133(L) 130(L)  Potassium 3.5 - 5.1 mEq/L 3.0(L) 4.2 6.0(H)  Chloride 96 - 112 mEq/L 107 107 107  CO2 19 - 32 mEq/L  19 18(L) 16(L)  Calcium 8.4 - 10.5 mg/dL 8.5 9.3 9.81(X  Total Protein 6.0 - 8.3 g/dL - - 9.0(H)  Total Bilirubin 0.2 - 1.2 mg/dL - - 2.3(H)  Alkaline Phos 39 - 117 U/L - - 69  AST 0 - 37 U/L - - 51(H)  ALT 0 - 35 U/L - - 25    Lipid Panel  No results found for: CHOL, TRIG, HDL, CHOLHDL, VLDL, LDLCALC, LDLDIRECT  CBC    Component Value Date/Time   WBC 7.6 12/04/2019 1309   WBC 19.6 (H) 10/23/2019 0315   RBC 2.67 (L) 12/04/2019 1309   RBC 2.70 (L) 12/04/2019 1309   HGB 9.6 (L)  12/04/2019 1309   HGB 9.7 (L) 11/19/2019 1404   HCT 28.4 (L) 12/04/2019 1309   HCT 26.4 (L) 11/19/2019 1404   PLT 134 (L) 12/04/2019 1309   PLT 154 11/19/2019 1404   MCV 105.2 (H) 12/04/2019 1309   MCV 104 (H) 11/19/2019 1404   MCH 35.6 (H) 12/04/2019 1309   MCHC 33.8 12/04/2019 1309   RDW 12.9 12/04/2019 1309   RDW 11.7 11/19/2019 1404   LYMPHSABS 1.6 12/04/2019 1309   LYMPHSABS 1.9 11/19/2019 1404   MONOABS 0.6 12/04/2019 1309   EOSABS 0.1 12/04/2019 1309   EOSABS 0.1 11/19/2019 1404   BASOSABS 0.1 12/04/2019 1309   BASOSABS 0.1 11/19/2019 1404    No results found for: HGBA1C  Assessment & Plan:  1. Alcoholic cirrhosis of liver with ascites (HCC) MRI findings reviewed stable  liver cirrhosis Encouraged to continue to abstain from alcohol  Continue with Spironolactone, Lasix Will check potassium level as last potassium was 3.0 She has gained 27 lbs likely due to her diuretic dose decrease Explained to patient and daughter that we will need to balance diuresis her against the risk of hypotension - might need dose increase At this time GI is adjusting her diuretic dose so she has been advised to reach out to them due to pedal edema and weight gain Will check ammonia level - Ammonia - Basic Metabolic Panel  2. Tremor, unspecified Could be secondary to prolonged alcohol use - Ammonia  3. Pedal edema See #1 above   Follow-up: Return in about 3 months (around 04/07/2020) for follow up on  tremors and mood.       Hoy Register, MD, FAAFP. [facility] [place], [place] [phone]   01/07/2020, 12:49 PM

## 2020-01-08 LAB — BASIC METABOLIC PANEL
BUN/Creatinine Ratio: 12 (ref 12–28)
BUN: 16 mg/dL (ref 8–27)
CO2: 19 mmol/L — ABNORMAL LOW (ref 20–29)
Calcium: 8.9 mg/dL (ref 8.7–10.3)
Chloride: 108 mmol/L — ABNORMAL HIGH (ref 96–106)
Creatinine, Ser: 1.31 mg/dL — ABNORMAL HIGH (ref 0.57–1.00)
GFR calc Af Amer: 50 mL/min/{1.73_m2} — ABNORMAL LOW (ref >59)
GFR calc non Af Amer: 43 mL/min/{1.73_m2} — ABNORMAL LOW (ref >59)
Glucose: 96 mg/dL (ref 65–99)
Potassium: 5.7 mmol/L — ABNORMAL HIGH (ref 3.5–5.2)
Sodium: 138 mmol/L (ref 134–144)

## 2020-01-08 LAB — AMMONIA: Ammonia: 114 ug/dL (ref 34–178)

## 2020-01-09 NOTE — Progress Notes (Signed)
[facility] Progress Note   Shirley Mays [id number] 01-Aug-1955 64 y.o.  Shirley Mays is managed by: Dr. Ruthann Cancer  Actively treated with chemotherapy/immunotherapy/hormonal therapy: no  Next scheduled appointment with provider: 03/10/2020  Assessment: Plan:    Anemia, unspecified type  Thrombocytopenia (HCC)  Contusion of thoracic wall, unspecified whether front or back, initial encounter   Anemia and thrombocytopenia suspected secondary to cirrhosis: The patient is followed conservatively by Dr. Darnelle Catalan and is scheduled to be seen in follow-up on 03/10/2020.  Contusion of the right lateral ribs, left ankle and left foot: The patient had a fall as she was coming into the hospital today to have an MRI completed of her abdomen.  She was referred for an x-ray of her ribs which returned showing:  FINDINGS: The heart is mildly enlarged.  There is aortic atherosclerosis with increased interstitial prominence, suspicious for mild edema.  There is no confluent airspace opacity, significant pleural effusion or pneumothorax.  Views of the right ribs were obtained with a marker inferiorly over the area of pain.  The lower ribs are partially obscured by the liver, but no focal rib fracture or rib lesion is identified.  IMPRESSION: 1. No evidence of right-sided rib fracture, pleural effusion or pneumothorax. 2. Cardiomegaly with increased interstitial prominence suspicious for mild edema.  The patient was told to use ice to the area and Motrin as needed.  Please see After Visit Summary for patient specific instructions.  Future Appointments  Date Time Provider Department Center  01/14/2020 12:00 PM [facility] ROOM [facility] None  01/14/2020  1:30 PM [facility] 1 [facility] [place]  01/29/2020  9:15 AM [facility] NURSE [facility]  02/10/2020 11:30 AM [facility] PREVISIT RM 68 [facility]  02/12/2020 10:00 AM Armbruster, Willaim Rayas, MD [facility]  03/10/2020   9:30 AM [facility] LAB [facility] None  03/10/2020 10:00 AM Magrinat, Valentino Hue, MD [facility] None    No orders of the defined types were placed in this encounter.      Subjective:   Patient ID:  Shirley Mays is a 64 y.o. (DOB 1956/01/01) female.  Chief Complaint:  Chief Complaint  Patient presents with  . Fall    HPI Shirley Mays  is a 64 y.o. female with a diagnosis of anemia and thrombocytopenia suspected secondary to cirrhosis. She is followed conservatively by Dr. Darnelle Catalan and is scheduled to be seen in follow-up on 03/10/2020.  She was walking into the hospital today to have an MRI completed when she stepped incorrectly and fell landing on the railing along the side of the sidewalk hitting her right lateral ribs and twisting her right ankle.  She is having left ankle foot swelling.  She is able to ambulate without issues.  She is having tenderness along her right lateral ribs.  She was referred for rib x-rays which returned showing:  FINDINGS: The heart is mildly enlarged.  There is aortic atherosclerosis with increased interstitial prominence, suspicious for mild edema.  There is no confluent airspace opacity, significant pleural effusion or pneumothorax.  Views of the right ribs were obtained with a marker inferiorly over the area of pain.  The lower ribs are partially obscured by the liver, but no focal rib fracture or rib lesion is identified.  IMPRESSION: 1. No evidence of right-sided rib fracture, pleural effusion or pneumothorax. 2. Cardiomegaly with increased interstitial prominence suspicious for mild edema.   Medications: I have reviewed the patient's current medications.  Allergies: No Known Allergies  Past Medical History:  Diagnosis Date  . Alcohol abuse   . Cirrhosis (HCC)   . Hiatal hernia   . Pneumonia   . Pyelonephritis   . Sigmoid diverticulosis     Past Surgical History:  Procedure Laterality Date  . IR PARACENTESIS  10/16/2019  . TUBAL  LIGATION      Family History  Problem Relation Age of Onset  . Liver disease Mother   . Kidney disease Mother   . Heart disease Mother   . Cancer Neg Hx   . Anemia Neg Hx   . Leukemia Neg Hx   . Thrombocytopenia Neg Hx     Social History   Socioeconomic History  . Marital status: Single    Spouse name: Not on file  . Number of children: Not on file  . Years of education: Not on file  . Highest education level: Not on file  Occupational History  . Not on file  Tobacco Use  . Smoking status: Current Every Day Smoker    Packs/day: 0.50    Years: 30.00    Pack years: 15.00    Types: Cigarettes  . Smokeless tobacco: Never Used  . Tobacco comment: 2-3 a day  Vaping Use  . Vaping Use: Never used  Substance and Sexual Activity  . Alcohol use: Not Currently    Comment: previously drank heavy about 3-6 drinks/day  . Drug use: Never  . Sexual activity: Not on file  Other Topics Concern  . Not on file  Social History Narrative  . Not on file   Social Determinants of Health   Financial Resource Strain:   . Difficulty of Paying Living Expenses: Not on file  Food Insecurity:   . Worried About Programme researcher, broadcasting/film/video in the Last Year: Not on file  . Ran Out of Food in the Last Year: Not on file  Transportation Needs:   . Lack of Transportation (Medical): Not on file  . Lack of Transportation (Non-Medical): Not on file  Physical Activity:   . Days of Exercise per Week: Not on file  . Minutes of Exercise per Session: Not on file  Stress:   . Feeling of Stress : Not on file  Social Connections:   . Frequency of Communication with Friends and Family: Not on file  . Frequency of Social Gatherings with Friends and Family: Not on file  . Attends Religious Services: Not on file  . Active Member of Clubs or Organizations: Not on file  . Attends Banker Meetings: Not on file  . Marital Status: Not on file  Intimate Partner Violence:   . Fear of Current or Ex-Partner:  Not on file  . Emotionally Abused: Not on file  . Physically Abused: Not on file  . Sexually Abused: Not on file    Past Medical History, Surgical history, Social history, and Family history were reviewed and updated as appropriate.   Please see review of systems for further details on the patient's review from today.   Review of Systems:  Review of Systems  Constitutional: Negative for chills, diaphoresis and fever.  HENT: Negative for trouble swallowing and voice change.   Respiratory: Negative for cough, chest tightness, shortness of breath and wheezing.   Cardiovascular: Positive for chest pain. Negative for palpitations.  Gastrointestinal: Negative for abdominal pain, constipation, diarrhea, nausea and vomiting.  Musculoskeletal: Positive for arthralgias, joint swelling and myalgias. Negative for back pain and gait problem.  Neurological: Negative for dizziness, light-headedness and headaches.  Objective:   Physical Exam:  BP (!) 106/58 (BP Location: Left Arm, Patient Position: Sitting) Comment: notified nurse  Pulse 70   Temp (!) 97.4 F (36.3 C) (Axillary) Comment: notified nurse  Resp 18   Ht 5\' 7"  (1.702 m)   Wt 166 lb 4.8 oz (75.4 kg)   SpO2 100%   BMI 26.05 kg/m  ECOG: 0  Physical Exam Constitutional:      General: She is not in acute distress.    Appearance: She is not diaphoretic.  HENT:     Head: Normocephalic and atraumatic.  Cardiovascular:     Rate and Rhythm: Normal rate and regular rhythm.     Heart sounds: Normal heart sounds. No murmur heard.  No friction rub. No gallop.   Pulmonary:     Effort: Pulmonary effort is normal. No respiratory distress.     Breath sounds: Normal breath sounds. No wheezing or rales.  Musculoskeletal:        General: Swelling and tenderness present.       Arms:       Legs:  Skin:    General: Skin is warm and dry.     Findings: No erythema or rash.  Neurological:     Mental Status: She is alert.     Lab  Review:     Component Value Date/Time   NA 138 01/07/2020 1130   K 5.7 (H) 01/07/2020 1130   CL 108 (H) 01/07/2020 1130   CO2 19 (L) 01/07/2020 1130   GLUCOSE 96 01/07/2020 1130   GLUCOSE 163 (H) 12/29/2019 0915   BUN 16 01/07/2020 1130   CREATININE 1.31 (H) 01/07/2020 1130   CALCIUM 8.9 01/07/2020 1130   PROT 9.0 (H) 12/17/2019 1234   PROT 7.8 11/19/2019 1404   ALBUMIN 3.7 12/17/2019 1234   ALBUMIN 3.6 (L) 11/19/2019 1404   AST 51 (H) 12/17/2019 1234   ALT 25 12/17/2019 1234   ALKPHOS 69 12/17/2019 1234   BILITOT 2.3 (H) 12/17/2019 1234   BILITOT 3.1 (H) 11/19/2019 1404   GFRNONAA 43 (L) 01/07/2020 1130   GFRAA 50 (L) 01/07/2020 1130       Component Value Date/Time   WBC 7.6 12/04/2019 1309   WBC 19.6 (H) 10/23/2019 0315   RBC 2.67 (L) 12/04/2019 1309   RBC 2.70 (L) 12/04/2019 1309   HGB 9.6 (L) 12/04/2019 1309   HGB 9.7 (L) 11/19/2019 1404   HCT 28.4 (L) 12/04/2019 1309   HCT 26.4 (L) 11/19/2019 1404   PLT 134 (L) 12/04/2019 1309   PLT 154 11/19/2019 1404   MCV 105.2 (H) 12/04/2019 1309   MCV 104 (H) 11/19/2019 1404   MCH 35.6 (H) 12/04/2019 1309   MCHC 33.8 12/04/2019 1309   RDW 12.9 12/04/2019 1309   RDW 11.7 11/19/2019 1404   LYMPHSABS 1.6 12/04/2019 1309   LYMPHSABS 1.9 11/19/2019 1404   MONOABS 0.6 12/04/2019 1309   EOSABS 0.1 12/04/2019 1309   EOSABS 0.1 11/19/2019 1404   BASOSABS 0.1 12/04/2019 1309   BASOSABS 0.1 11/19/2019 1404   -------------------------------  Imaging from last 24 hours (if applicable):  Radiology interpretation: DG Ribs Unilateral W/Chest Right  Result Date: 01/02/2020 CLINICAL DATA:  Fall today.  Posterior rib pain.  Smoker. EXAM: RIGHT RIBS AND CHEST - 3+ VIEW COMPARISON:  Radiographs 10/20/2019 and 10/15/2019. Abdominal MRI today. FINDINGS: The heart is mildly enlarged. There is aortic atherosclerosis with increased interstitial prominence, suspicious for mild edema. There is no confluent airspace opacity, significant pleural  effusion or pneumothorax. Views of the right ribs were obtained with a marker inferiorly over the area of pain. The lower ribs are partially obscured by the liver, but no focal rib fracture or rib lesion is identified. IMPRESSION: 1. No evidence of right-sided rib fracture, pleural effusion or pneumothorax. 2. Cardiomegaly with increased interstitial prominence suspicious for mild edema. Electronically Signed   By: Carey Bullocks M.D.   On: 01/02/2020 16:27   MR Abdomen W Wo Contrast  Result Date: 01/02/2020 CLINICAL DATA:  Cirrhosis.  Elevated AFP.  Ascites. EXAM: MRI ABDOMEN WITHOUT AND WITH CONTRAST TECHNIQUE: Multiplanar multisequence MR imaging of the abdomen was performed both before and after the administration of intravenous contrast. CONTRAST:  14mL GADAVIST GADOBUTROL 1 MMOL/ML IV SOLN COMPARISON:  10/21/2019 CT FINDINGS: Lower chest: Mild cardiomegaly, without pericardial or pleural effusion. Hepatobiliary: Moderate cirrhosis.  No suspicious liver lesion. Normal gallbladder, without biliary ductal dilatation. Pancreas:  Normal, without mass or ductal dilatation. Spleen:  Splenomegaly, 14.2 cm craniocaudal. Adrenals/Urinary Tract: Normal adrenal glands. Normal kidneys, without hydronephrosis. Stomach/Bowel: Proximal gastric underdistention. Normal abdominal bowel loops. Vascular/Lymphatic: Separate origins of the common hepatic and splenic arteries. Aortic atherosclerosis. Portosystemic collaterals, including within the right-side of the abdomen and a recannulized paraumbilical vein. Prominent abdominal retroperitoneal nodes are likely reactive and not pathologic by size criteria. Other:  Trace perihepatic and perisplenic ascites. Musculoskeletal: T2 hyperintense sacral and lower thoracic vertebral body lesions are likely hemangiomas. IMPRESSION: 1. Cirrhosis and portal venous hypertension, without evidence of hepatocellular carcinoma. 2. Trace abdominal ascites. 3.  Aortic Atherosclerosis  (ICD10-I70.0). Electronically Signed   By: Jeronimo Greaves M.D.   On: 01/02/2020 11:20

## 2020-01-12 ENCOUNTER — Other Ambulatory Visit: Payer: Self-pay

## 2020-01-12 DIAGNOSIS — E875 Hyperkalemia: Secondary | ICD-10-CM

## 2020-01-12 DIAGNOSIS — K7460 Unspecified cirrhosis of liver: Secondary | ICD-10-CM

## 2020-01-12 DIAGNOSIS — R188 Other ascites: Secondary | ICD-10-CM

## 2020-01-13 ENCOUNTER — Other Ambulatory Visit: Payer: Self-pay | Admitting: Radiology

## 2020-01-14 ENCOUNTER — Ambulatory Visit (HOSPITAL_COMMUNITY): Payer: Self-pay

## 2020-01-14 ENCOUNTER — Telehealth: Payer: Self-pay

## 2020-01-14 ENCOUNTER — Other Ambulatory Visit (HOSPITAL_COMMUNITY): Payer: Self-pay

## 2020-01-14 NOTE — Telephone Encounter (Signed)
Spoke with patient's daughter Ander Slade to remind her that patient will need to come back in for repeat labs after medication adjustments. Advised that once results come back Dr. Adela Lank will advised of any necessary adjustments. She is aware that no appt is necessary and that they can go by the lab between 7:30 am-5 pm

## 2020-01-14 NOTE — Telephone Encounter (Signed)
-----   Message from Missy Sabins, RN sent at 01/12/2020 11:06 AM EDT ----- Regarding: Labs Repeat BMET, order in epic

## 2020-01-15 ENCOUNTER — Telehealth: Payer: Self-pay

## 2020-01-15 NOTE — Telephone Encounter (Signed)
Patient was called and a voicemail was left informing patient to return phone call for lab results. 

## 2020-01-15 NOTE — Telephone Encounter (Signed)
-----   Message from Hoy Register, MD sent at 01/08/2020  4:50 PM EDT ----- Ammonia level is normal.  Potassium is elevated at 5.7. Please advise her to increase Lasix from 40 to 80mg  and decrease Spironolactone to 25mg . She needs to call GI to inform them of her pedal edema, recent labs and med changes and keep follow up lab appt scheduled by GI.

## 2020-01-19 ENCOUNTER — Other Ambulatory Visit (INDEPENDENT_AMBULATORY_CARE_PROVIDER_SITE_OTHER): Payer: Self-pay

## 2020-01-19 DIAGNOSIS — K7460 Unspecified cirrhosis of liver: Secondary | ICD-10-CM

## 2020-01-19 DIAGNOSIS — R188 Other ascites: Secondary | ICD-10-CM

## 2020-01-19 DIAGNOSIS — E875 Hyperkalemia: Secondary | ICD-10-CM

## 2020-01-19 LAB — BASIC METABOLIC PANEL
BUN: 12 mg/dL (ref 6–23)
CO2: 24 mEq/L (ref 19–32)
Calcium: 8.6 mg/dL (ref 8.4–10.5)
Chloride: 106 mEq/L (ref 96–112)
Creatinine, Ser: 0.85 mg/dL (ref 0.40–1.20)
GFR: 67.19 mL/min (ref >60.00)
Glucose, Bld: 122 mg/dL — ABNORMAL HIGH (ref 70–99)
Potassium: 3.9 mEq/L (ref 3.5–5.1)
Sodium: 136 mEq/L (ref 135–145)

## 2020-01-22 ENCOUNTER — Other Ambulatory Visit: Payer: Self-pay

## 2020-01-22 DIAGNOSIS — K7460 Unspecified cirrhosis of liver: Secondary | ICD-10-CM

## 2020-01-22 DIAGNOSIS — R188 Other ascites: Secondary | ICD-10-CM

## 2020-01-22 DIAGNOSIS — E875 Hyperkalemia: Secondary | ICD-10-CM

## 2020-01-22 MED ORDER — POTASSIUM CHLORIDE CRYS ER 20 MEQ PO TBCR: 20.0000 meq | EXTENDED_RELEASE_TABLET | Freq: Every day | ORAL | 0 refills | Status: DC

## 2020-01-28 ENCOUNTER — Telehealth: Payer: Self-pay

## 2020-01-28 NOTE — Telephone Encounter (Signed)
Lm for pt that she missed her appt this morning to get her 2nd Twinrix injection. Asked her to call back to get rescheduled to stay on track with the series.

## 2020-01-29 ENCOUNTER — Telehealth: Payer: Self-pay

## 2020-01-29 ENCOUNTER — Ambulatory Visit (INDEPENDENT_AMBULATORY_CARE_PROVIDER_SITE_OTHER): Payer: Self-pay | Admitting: Gastroenterology

## 2020-01-29 DIAGNOSIS — K7460 Unspecified cirrhosis of liver: Secondary | ICD-10-CM

## 2020-01-29 DIAGNOSIS — Z23 Encounter for immunization: Secondary | ICD-10-CM

## 2020-01-29 DIAGNOSIS — R188 Other ascites: Secondary | ICD-10-CM

## 2020-01-29 DIAGNOSIS — R772 Abnormality of alphafetoprotein: Secondary | ICD-10-CM

## 2020-01-29 NOTE — Telephone Encounter (Signed)
-----   Message from Missy Sabins, RN sent at 01/22/2020  9:36 AM EDT ----- Regarding: Labs BMET, order in epic

## 2020-01-29 NOTE — Telephone Encounter (Signed)
Spoke with patient's daughter to remind her that she is due for repeat labs, she states that she will have the patient go by the lab tomorrow.

## 2020-02-04 ENCOUNTER — Other Ambulatory Visit: Payer: Self-pay | Admitting: Radiology

## 2020-02-04 ENCOUNTER — Other Ambulatory Visit: Payer: Self-pay | Admitting: Student

## 2020-02-05 ENCOUNTER — Inpatient Hospital Stay (HOSPITAL_COMMUNITY): Admission: RE | Admit: 2020-02-05 | Payer: Self-pay | Source: Ambulatory Visit

## 2020-02-05 ENCOUNTER — Ambulatory Visit (HOSPITAL_COMMUNITY): Payer: Self-pay

## 2020-02-10 ENCOUNTER — Other Ambulatory Visit: Payer: Self-pay

## 2020-02-12 ENCOUNTER — Encounter: Payer: Self-pay | Admitting: Gastroenterology

## 2020-02-19 ENCOUNTER — Other Ambulatory Visit: Payer: Self-pay | Admitting: Student

## 2020-02-20 ENCOUNTER — Other Ambulatory Visit: Payer: Self-pay

## 2020-02-20 ENCOUNTER — Ambulatory Visit (HOSPITAL_COMMUNITY)
Admission: RE | Admit: 2020-02-20 | Discharge: 2020-02-20 | Disposition: A | Discharge status: Home / Self Care | Payer: Self-pay | Source: Ambulatory Visit | Attending: Gastroenterology | Admitting: Gastroenterology

## 2020-02-20 ENCOUNTER — Encounter (HOSPITAL_COMMUNITY): Payer: Self-pay

## 2020-02-20 ENCOUNTER — Other Ambulatory Visit: Payer: Self-pay | Admitting: Gastroenterology

## 2020-02-20 DIAGNOSIS — K7460 Unspecified cirrhosis of liver: Secondary | ICD-10-CM

## 2020-02-20 DIAGNOSIS — R772 Abnormality of alphafetoprotein: Secondary | ICD-10-CM

## 2020-02-20 DIAGNOSIS — Z79899 Other long term (current) drug therapy: Secondary | ICD-10-CM | POA: Insufficient documentation

## 2020-02-20 DIAGNOSIS — R188 Other ascites: Secondary | ICD-10-CM

## 2020-02-20 DIAGNOSIS — K766 Portal hypertension: Secondary | ICD-10-CM | POA: Insufficient documentation

## 2020-02-20 DIAGNOSIS — K7030 Alcoholic cirrhosis of liver without ascites: Secondary | ICD-10-CM | POA: Insufficient documentation

## 2020-02-20 HISTORY — PX: IR TRANSCATHETER BX: IMG713

## 2020-02-20 HISTORY — PX: IR US GUIDE VASC ACCESS RIGHT: IMG2390

## 2020-02-20 HISTORY — PX: IR VENOGRAM HEPATIC W HEMODYNAMIC EVALUATION: IMG692

## 2020-02-20 LAB — CBC WITH DIFFERENTIAL/PLATELET
Abs Immature Granulocytes: 0.02 10*3/uL (ref 0.00–0.07)
Basophils Absolute: 0.1 10*3/uL (ref 0.0–0.1)
Basophils Relative: 1 %
Eosinophils Absolute: 0.2 10*3/uL (ref 0.0–0.5)
Eosinophils Relative: 2 %
HCT: 33.4 % — ABNORMAL LOW (ref 36.0–46.0)
Hemoglobin: 10.9 g/dL — ABNORMAL LOW (ref 12.0–15.0)
Immature Granulocytes: 0 %
Lymphocytes Relative: 24 %
Lymphs Abs: 2 10*3/uL (ref 0.7–4.0)
MCH: 33.6 pg (ref 26.0–34.0)
MCHC: 32.6 g/dL (ref 30.0–36.0)
MCV: 103.1 fL — ABNORMAL HIGH (ref 80.0–100.0)
Monocytes Absolute: 1 10*3/uL (ref 0.1–1.0)
Monocytes Relative: 12 %
Neutro Abs: 5.2 10*3/uL (ref 1.7–7.7)
Neutrophils Relative %: 61 %
Platelets: 144 10*3/uL — ABNORMAL LOW (ref 150–400)
RBC: 3.24 MIL/uL — ABNORMAL LOW (ref 3.87–5.11)
RDW: 16.9 % — ABNORMAL HIGH (ref 11.5–15.5)
WBC: 8.5 10*3/uL (ref 4.0–10.5)
nRBC: 0 % (ref 0.0–0.2)

## 2020-02-20 LAB — COMPREHENSIVE METABOLIC PANEL
ALT: 24 U/L (ref 0–44)
AST: 54 U/L — ABNORMAL HIGH (ref 15–41)
Albumin: 2.8 g/dL — ABNORMAL LOW (ref 3.5–5.0)
Alkaline Phosphatase: 67 U/L (ref 38–126)
Anion gap: 8 (ref 5–15)
BUN: 18 mg/dL (ref 8–23)
CO2: 20 mmol/L — ABNORMAL LOW (ref 22–32)
Calcium: 9.2 mg/dL (ref 8.9–10.3)
Chloride: 108 mmol/L (ref 98–111)
Creatinine, Ser: 0.77 mg/dL (ref 0.44–1.00)
GFR, Estimated: >60 mL/min (ref >60)
Glucose, Bld: 96 mg/dL (ref 70–99)
Potassium: 4 mmol/L (ref 3.5–5.1)
Sodium: 136 mmol/L (ref 135–145)
Total Bilirubin: 2.7 mg/dL — ABNORMAL HIGH (ref 0.3–1.2)
Total Protein: 7.3 g/dL (ref 6.5–8.1)

## 2020-02-20 LAB — PROTIME-INR
INR: 1.6 — ABNORMAL HIGH (ref 0.8–1.2)
Prothrombin Time: 18.5 seconds — ABNORMAL HIGH (ref 11.4–15.2)

## 2020-02-20 MED ORDER — HYDROCODONE-ACETAMINOPHEN 5-325 MG PO TABS
1.0000 | ORAL_TABLET | ORAL | Status: DC | PRN
  Administered 2020-02-20: 1 via ORAL
  Filled 2020-02-20: qty 1

## 2020-02-20 MED ORDER — SODIUM CHLORIDE 0.9 % IV SOLN: INTRAVENOUS | Status: DC

## 2020-02-20 MED ORDER — LIDOCAINE-EPINEPHRINE (PF) 2 %-1:200000 IJ SOLN
INTRAMUSCULAR | Status: AC
  Filled 2020-02-20: qty 20

## 2020-02-20 MED ORDER — FENTANYL CITRATE (PF) 100 MCG/2ML IJ SOLN
INTRAMUSCULAR | Status: AC | PRN
Start: 2020-02-20 — End: 2020-02-20
  Administered 2020-02-20 (×2): 50 ug via INTRAVENOUS

## 2020-02-20 MED ORDER — FENTANYL CITRATE (PF) 100 MCG/2ML IJ SOLN
INTRAMUSCULAR | Status: AC
  Filled 2020-02-20: qty 2

## 2020-02-20 MED ORDER — MIDAZOLAM HCL 2 MG/2ML IJ SOLN
INTRAMUSCULAR | Status: AC | PRN
  Administered 2020-02-20 (×2): 1 mg via INTRAVENOUS

## 2020-02-20 MED ORDER — IOHEXOL 300 MG/ML  SOLN: 50.0000 mL | Freq: Once | INTRAMUSCULAR | Status: DC | PRN

## 2020-02-20 MED ORDER — LIDOCAINE HCL (PF) 1 % IJ SOLN
INTRAMUSCULAR | Status: AC | PRN
  Administered 2020-02-20: 5 mL

## 2020-02-20 MED ORDER — MIDAZOLAM HCL 2 MG/2ML IJ SOLN
INTRAMUSCULAR | Status: AC
  Filled 2020-02-20: qty 4

## 2020-02-20 NOTE — H&P (Signed)
Referring Physician(s): Armbruster,Steven P  Supervising Physician: Suttle,D  Patient Status:  [facility]  Chief Complaint:  "I'm having a liver biopsy"  Subjective: Patient familiar to IR service from paracentesis on 10/16/2019. She has a history of alcoholic cirrhosis, portal hypertension, ascites, elevated ferritin, slightly elevated AFP, positive ANA and elevated actin/smooth muscle antibody.  She presents today for image guided transjugular liver biopsy to rule out autoimmune hepatitis/? possible paracentesis.  She currently denies fever, headache, chest pain, dyspnea, cough, nausea, vomiting or bleeding.  She is a smoker, no longer drinks alcohol and does have some intermittent abdominal/back pain along with bilateral lower extremity swelling. Additional med hx as below.   Past Medical History:  Diagnosis Date  . Alcohol abuse   . Cirrhosis (HCC)   . Hiatal hernia   . Pneumonia   . Pyelonephritis   . Sigmoid diverticulosis    Past Surgical History:  Procedure Laterality Date  . IR PARACENTESIS  10/16/2019  . TUBAL LIGATION        Allergies: Patient has no known allergies.  Medications: Prior to Admission medications   Medication Sig Start Date End Date Taking? Authorizing Provider  furosemide (LASIX) 40 MG tablet Take 1 tablet (40 mg total) by mouth daily. 10/24/19  Yes Almon Hercules, MD  Multiple Vitamin (MULTIVITAMIN WITH MINERALS) TABS tablet Take 1 tablet by mouth daily. 10/24/19  Yes Almon Hercules, MD  sertraline (ZOLOFT) 100 MG tablet Take 100 mg by mouth daily. Pt stated taking it at bedtime   Yes [provider]  spironolactone (ALDACTONE) 100 MG tablet Take 1 tablet (100 mg total) by mouth daily. 10/24/19  Yes Almon Hercules, MD  thiamine 100 MG tablet Take 1 tablet (100 mg total) by mouth daily. 10/24/19  Yes Almon Hercules, MD  ASHWAGANDHA PO Take 920 mg by mouth at bedtime.    [provider]  cholestyramine (QUESTRAN) 4 g packet Take 1 packet  (4 g total) by mouth 3 (three) times daily with meals. For itching 11/19/19   Anders Simmonds, PA-C  ciprofloxacin (CIPRO) 500 MG tablet Take 500 mg by mouth daily with breakfast.    [provider]  HYDROcodone-acetaminophen (NORCO) 5-325 MG tablet 1/2 to 1 tablet every 4 hours prn pain 01/02/20   Tanner, Kathrin Greathouse., PA-C  potassium chloride SA (KLOR-CON) 20 MEQ tablet Take 1 tablet (20 mEq total) by mouth daily for 7 days. 01/22/20 01/29/20  Benancio Deeds, MD     Vital Signs: BP 136/76 (BP Location: Right Arm)   Pulse 92   Temp 98.2 F (36.8 C) (Oral)   Resp 18   Ht 5' 7.5" (1.715 m)   SpO2 100%   BMI 27.37 kg/m   Physical Exam awake, alert.  Chest clear to auscultation bilaterally.  Heart with regular rate and rhythm.  Abdomen soft, positive bowel sounds, some mild distention, mild generalized tenderness to palpation, more so in the lower region; bilat lower extremity edema noted  Imaging: No results found.  Labs:  CBC: Recent Labs    10/23/19 0315 11/19/19 1404 12/04/19 1309 02/20/20 0833  WBC 19.6* 8.0 7.6 8.5  HGB 10.2* 9.7* 9.6* 10.9*  HCT 29.1* 26.4* 28.4* 33.4*  PLT 339 154 134* 144*    COAGS: Recent Labs    10/14/19 2357 10/15/19 0520 10/16/19 0500 10/20/19 1101 10/23/19 0315 12/17/19 1234  INR 2.3*   < > 2.1* 2.2* 1.9* 1.6*  APTT 47*  --   --   --  52*  --    < > = values in this interval not displayed.    BMP: Recent Labs    10/23/19 0315 10/23/19 0315 11/19/19 1404 12/04/19 1309 12/17/19 1234 12/19/19 1012 12/29/19 0915 01/07/20 1130 01/19/20 1047  NA 131*   < > 133* 133*   < > 133* 135 138 136  K 4.7   < > 5.7* 5.0   < > 4.2 3.0* 5.7* 3.9  CL 104   < > 104 110   < > 107 107 108* 106  CO2 20*   < > 18* 16*   < > 18* 19 19* 24  GLUCOSE 94   < > 83 79   < > 106* 163* 96 122*  BUN 7*   < > 20 20   < > 20 14 16 12   CALCIUM 8.4*   < > 9.9 9.7   < > 9.3 8.5 8.9 8.6  CREATININE 0.86   < > 1.37* 1.17*   < > 1.29* 1.03 1.31* 0.85    GFRNONAA >60  --  41* 49*  --   --   --  43*  --   GFRAA >60  --  47* 57*  --   --   --  50*  --    < > = values in this interval not displayed.    LIVER FUNCTION TESTS: Recent Labs    10/23/19 0315 11/19/19 1404 12/04/19 1309 12/17/19 1234  BILITOT 7.5* 3.1* 2.7* 2.3*  AST 92* 39 41 51*  ALT 42 22 19 25   ALKPHOS 67 70 61 69  PROT 6.9 7.8 7.8 9.0*  ALBUMIN 2.4* 3.6* 3.0* 3.7    Assessment and Plan: Patient familiar to IR service from paracentesis on 10/16/2019. She has a history of alcoholic cirrhosis, portal hypertension, ascites, elevated ferritin, slightly elevated AFP, positive ANA and elevated actin/smooth muscle antibody.  She presents today for image guided transjugular liver biopsy to rule out autoimmune hepatitis/? possible paracentesis. Risks and benefits of procedure was discussed with the patient  including, but not limited to bleeding, infection, damage to adjacent structures or low yield requiring additional tests.  All of the questions were answered and there is agreement to proceed.  Consent signed and in chart.      Electronically Signed: D. , PA-C 02/20/2020, 8:50 AM   I spent a total of 25 minutes at the the patient's bedside AND on the patient's hospital floor or unit, greater than 50% of which was counseling/coordinating care for image guided transjugular liver biopsy

## 2020-02-20 NOTE — Procedures (Signed)
Interventional Radiology Procedure Note   Procedure: Transjugular liver biopsy with pressure measurements  Findings: Please refer to procedural dictation for full description. 18 ga core biopsy x 4.  Samples placed in formalin and sent to Pathology.  Pressure measurements as follows (mmHg): Right atrium = 16/8 (mean 12) Right hepatic = 19/13 (mean 15) Wedge portal = 39/26 (mean 33) Portosystemic mean gradient = 21  Complications: None immediate  Estimated Blood Loss: <5 mL  Recommendations: Strict bedrest for 3 hours. Follow up Pathology results.   Marliss Coots, MD Pager: [phone]

## 2020-02-20 NOTE — Discharge Instructions (Signed)
Liver Biopsy, Care After These instructions give you information about how to care for yourself after your procedure. Your health care provider may also give you more specific instructions. If you have problems or questions, contact your health care provider. What can I expect after the procedure? After your procedure, it is common to have:  Pain and soreness in the area where the biopsy was done.  Bruising around the area where the biopsy was done.  Sleepiness and fatigue for 1-2 days. Follow these instructions at home: Medicines  Take over-the-counter and prescription medicines only as told by your health care provider.  If you were prescribed an antibiotic medicine, take it as told by your health care provider. Do not stop taking the antibiotic even if you start to feel better.  Do not take medicines such as aspirin and ibuprofen unless your health care provider tells you to take them. These medicines thin your blood and can increase the risk of bleeding.  If you are taking prescription pain medicine, take actions to prevent or treat constipation. Your health care provider may recommend that you: ? Drink enough fluid to keep your urine pale yellow. ? Eat foods that are high in fiber, such as fresh fruits and vegetables, whole grains, and beans. ? Limit foods that are high in fat and processed sugars, such as fried or sweet foods. ? Take an over-the-counter or prescription medicine for constipation. Incision care  Follow instructions from your health care provider about how to take care of your incision. Make sure you: ? Wash your hands with soap and water before you change your bandage (dressing). If soap and water are not available, use hand sanitizer. ? Change your dressing as told by your health care provider. ? Leave stitches (sutures), skin glue, or adhesive strips in place. These skin closures may need to stay in place for 2 weeks or longer. If adhesive strip edges start to  loosen and curl up, you may trim the loose edges. Do not remove adhesive strips completely unless your health care provider tells you to do that.  Check your incision area every day for signs of infection. Check for: ? Redness, swelling, or pain. ? Fluid or blood. ? Warmth. ? Pus or a bad smell.  Do not take baths, swim, or use a hot tub until your health care provider says it is okay to do so. Activity   Rest at home for 1-2 days, or as directed by your health care provider. ? Avoid sitting for a long time without moving. Get up to take short walks every 1-2 hours. This is important to improve blood flow and breathing. Ask for help if you feel weak or unsteady.  Return to your normal activities as told by your health care provider. Ask your health care provider what activities are safe for you.  Do not drive or use heavy machinery while taking prescription pain medicine.  Do not lift anything that is heavier than 10 lb (4.5 kg), or the limit that your health care provider tells you, until he or she says that it is safe.  Do not play contact sports for 2 weeks after the procedure. General instructions   Do not drink alcohol in the first week after the procedure.  Have someone stay with you for at least 24 hours after the procedure.  It is your responsibility to obtain your test results. Ask your health care provider, or the department that is doing the test: ? When will my   results be ready? ? How will I get my results? ? What are my treatment options? ? What other tests do I need? ? What are my next steps?  Keep all follow-up visits as told by your health care provider. This is important. Contact a health care provider if:  You have increased bleeding from an incision, resulting in more than a small spot of blood.  You have redness, swelling, or increasing pain in any incisions.  You notice a discharge or a bad smell coming from any of your incisions.  You have a fever or  chills. Get help right away if:  You develop swelling, bloating, or pain in your abdomen.  You become dizzy or faint.  You develop a rash.  You have nausea or you vomit.  You faint, or you have shortness of breath or difficulty breathing.  You develop chest pain.  You have problems with your speech or vision.  You have trouble with your balance or moving your arms or legs. Summary  After the liver biopsy, it is common to have pain, soreness, and bruising in the area, as well as sleepiness and fatigue.  Take over-the-counter and prescription medicines only as told by your health care provider.  Follow instructions from your health care provider about how to care for your incision. Check the incision area daily for signs of infection. This information is not intended to replace advice given to you by your health care provider. Make sure you discuss any questions you have with your health care provider. Document Revised: 06/10/2018 Document Reviewed: 04/27/2017 Elsevier Patient Education  2020 [employer]   Moderate Conscious Sedation, Adult, Care After These instructions provide you with information about caring for yourself after your procedure. Your health care provider may also give you more specific instructions. Your treatment has been planned according to current medical practices, but problems sometimes occur. Call your health care provider if you have any problems or questions after your procedure. What can I expect after the procedure? After your procedure, it is common:  To feel sleepy for several hours.  To feel clumsy and have poor balance for several hours.  To have poor judgment for several hours.  To vomit if you eat too soon. Follow these instructions at home: For at least 24 hours after the procedure:   Do not: ? Participate in activities where you could fall or become injured. ? Drive. ? Use heavy machinery. ? Drink alcohol. ? Take sleeping pills  or medicines that cause drowsiness. ? Make important decisions or sign legal documents. ? Take care of children on your own.  Rest. Eating and drinking  Follow the diet recommended by your health care provider.  If you vomit: ? Drink water, juice, or soup when you can drink without vomiting. ? Make sure you have little or no nausea before eating solid foods. General instructions  Have a responsible adult stay with you until you are awake and alert.  Take over-the-counter and prescription medicines only as told by your health care provider.  If you smoke, do not smoke without supervision.  Keep all follow-up visits as told by your health care provider. This is important. Contact a health care provider if:  You keep feeling nauseous or you keep vomiting.  You feel light-headed.  You develop a rash.  You have a fever. Get help right away if:  You have trouble breathing. This information is not intended to replace advice given to you by your health care   provider. Make sure you discuss any questions you have with your health care provider. Document Revised: 03/30/2017 Document Reviewed: 08/07/2015 Elsevier Patient Education  2020 [employer] Inc.  

## 2020-02-23 ENCOUNTER — Other Ambulatory Visit: Payer: Self-pay

## 2020-02-23 LAB — SURGICAL PATHOLOGY

## 2020-02-25 ENCOUNTER — Telehealth: Payer: Self-pay

## 2020-02-25 NOTE — Telephone Encounter (Signed)
Called and spoke to pt.  She had a CMET on 02-20-20 when she had her biopsy. I offered to get her reschedule for an EGD but she declined because of finances. She said it is just too expensive right now.  She needs to work on getting Medicaid in place first.  She assured me she will call back to schedule as soon as she can.

## 2020-02-25 NOTE — Telephone Encounter (Signed)
-----   Message from Cooper Render, CMA sent at 02/18/2020  5:47 PM EDT ----- Regarding: FW: follow up labs  ----- Message ----- From: Benancio Deeds, MD Sent: 02/16/2020   1:12 PM EDT To: Cooper Render, CMA Subject: follow up labs                                 Jan this patient is due for a follow up BMET, overdue for this lab, and also she cancelled her EGD last week. She needs to be rescheduled. Thank you!

## 2020-03-08 ENCOUNTER — Telehealth: Payer: Self-pay | Admitting: Gastroenterology

## 2020-03-08 NOTE — Telephone Encounter (Signed)
Pt is requesting a call back from a nurse regarding her lab results. 

## 2020-03-08 NOTE — Telephone Encounter (Signed)
Dr. Adela Lank, patient is returning your call in regards to 02/20/20 pathology results. Please call her at [phone]. Thank you.

## 2020-03-09 NOTE — Telephone Encounter (Signed)
[place] I called the patient back.  I have called her 5 times and left messages including a MyChart message and have not been able to get a hold of her.  Can you please try to call her again today and see if and get a hold of her.  Her liver biopsy confirms cirrhosis, likely due to alcohol but also possible component of autoimmune hepatitis.  I am recommending referral to hepatology for their input to discuss long-term management of this.  She is also due for an EGD and colonoscopy that she could not schedule at her last visit, not sure if she is willing to do it at this time.  I am happy to call her again tomorrow if she is going to be available.

## 2020-03-10 ENCOUNTER — Inpatient Hospital Stay: Payer: Self-pay

## 2020-03-10 ENCOUNTER — Inpatient Hospital Stay: Payer: Self-pay | Admitting: Oncology

## 2020-03-10 ENCOUNTER — Other Ambulatory Visit: Payer: Self-pay

## 2020-03-10 DIAGNOSIS — D696 Thrombocytopenia, unspecified: Secondary | ICD-10-CM

## 2020-03-10 NOTE — Telephone Encounter (Signed)
Lm on vm for patient to return call 

## 2020-03-10 NOTE — Telephone Encounter (Signed)
Dr. Adela Lank, just an [facility] with patient in regards to her liver biopsy results. Pt states that she would not like to proceed with procedures at this time but she would like for me to send the referral to hepatology.   Referral to [facility] sent. Patient is aware that they should be contacting her within a week to schedule, advised patient to stay near her phone for their call. Patient verbalized understanding of all information and had no concerns at the end of the call.

## 2020-03-10 NOTE — Telephone Encounter (Signed)
Spoke with patient and she is aware that procedures are highly recommended, pt advised that we will see her in 3 months for a follow up, advised patient to completely avoid alcohol - pt states that she has not had a drop of alcohol in a while. Reminded patient that [facility] will be in contact with her soon to schedule. Patient verbalized understanding of all information and had no concerns at the end of the call  3 month follow up recall in epic.

## 2020-03-10 NOTE — Telephone Encounter (Signed)
Okay thanks [employer]. I know she is anxious about the procedures, both are highly recommended. I would like her to see Hepatology at least and then she can see me again in 3 months for reassessment and see if she is willing at that time. She should completely avoid alcohol in the interim. Thanks

## 2020-03-10 NOTE — Telephone Encounter (Signed)
Patient is returning your call.  

## 2020-03-17 ENCOUNTER — Inpatient Hospital Stay (HOSPITAL_BASED_OUTPATIENT_CLINIC_OR_DEPARTMENT_OTHER): Payer: Self-pay | Admitting: Adult Health

## 2020-03-17 ENCOUNTER — Encounter: Payer: Self-pay | Admitting: Adult Health

## 2020-03-17 ENCOUNTER — Inpatient Hospital Stay: Payer: Self-pay | Attending: Adult Health

## 2020-03-17 ENCOUNTER — Other Ambulatory Visit: Payer: Self-pay

## 2020-03-17 VITALS — BP 116/57 | HR 78 | Temp 97.7°F | Resp 18 | Ht 67.5 in | Wt 193.8 lb

## 2020-03-17 DIAGNOSIS — I700 Atherosclerosis of aorta: Secondary | ICD-10-CM | POA: Insufficient documentation

## 2020-03-17 DIAGNOSIS — D539 Nutritional anemia, unspecified: Secondary | ICD-10-CM | POA: Insufficient documentation

## 2020-03-17 DIAGNOSIS — D696 Thrombocytopenia, unspecified: Secondary | ICD-10-CM | POA: Insufficient documentation

## 2020-03-17 DIAGNOSIS — F1010 Alcohol abuse, uncomplicated: Secondary | ICD-10-CM | POA: Insufficient documentation

## 2020-03-17 DIAGNOSIS — K5730 Diverticulosis of large intestine without perforation or abscess without bleeding: Secondary | ICD-10-CM | POA: Insufficient documentation

## 2020-03-17 DIAGNOSIS — F17210 Nicotine dependence, cigarettes, uncomplicated: Secondary | ICD-10-CM | POA: Insufficient documentation

## 2020-03-17 DIAGNOSIS — J90 Pleural effusion, not elsewhere classified: Secondary | ICD-10-CM | POA: Insufficient documentation

## 2020-03-17 DIAGNOSIS — Z79899 Other long term (current) drug therapy: Secondary | ICD-10-CM | POA: Insufficient documentation

## 2020-03-17 DIAGNOSIS — K7460 Unspecified cirrhosis of liver: Secondary | ICD-10-CM | POA: Insufficient documentation

## 2020-03-17 DIAGNOSIS — R188 Other ascites: Secondary | ICD-10-CM | POA: Insufficient documentation

## 2020-03-17 DIAGNOSIS — Z7289 Other problems related to lifestyle: Secondary | ICD-10-CM | POA: Insufficient documentation

## 2020-03-17 LAB — CMP (CANCER CENTER ONLY)
ALT: 18 U/L (ref 0–44)
AST: 52 U/L — ABNORMAL HIGH (ref 15–41)
Albumin: 2.9 g/dL — ABNORMAL LOW (ref 3.5–5.0)
Alkaline Phosphatase: 105 U/L (ref 38–126)
Anion gap: 5 (ref 5–15)
BUN: 11 mg/dL (ref 8–23)
CO2: 24 mmol/L (ref 22–32)
Calcium: 9.5 mg/dL (ref 8.9–10.3)
Chloride: 109 mmol/L (ref 98–111)
Creatinine: 0.84 mg/dL (ref 0.44–1.00)
GFR, Estimated: >60 mL/min (ref >60)
Glucose, Bld: 86 mg/dL (ref 70–99)
Potassium: 5.4 mmol/L — ABNORMAL HIGH (ref 3.5–5.1)
Sodium: 138 mmol/L (ref 135–145)
Total Bilirubin: 1.9 mg/dL — ABNORMAL HIGH (ref 0.3–1.2)
Total Protein: 8 g/dL (ref 6.5–8.1)

## 2020-03-17 LAB — CBC WITH DIFFERENTIAL (CANCER CENTER ONLY)
Abs Immature Granulocytes: 0.02 10*3/uL (ref 0.00–0.07)
Basophils Absolute: 0.1 10*3/uL (ref 0.0–0.1)
Basophils Relative: 1 %
Eosinophils Absolute: 0.2 10*3/uL (ref 0.0–0.5)
Eosinophils Relative: 2 %
HCT: 35.7 % — ABNORMAL LOW (ref 36.0–46.0)
Hemoglobin: 11.6 g/dL — ABNORMAL LOW (ref 12.0–15.0)
Immature Granulocytes: 0 %
Lymphocytes Relative: 28 %
Lymphs Abs: 2.6 10*3/uL (ref 0.7–4.0)
MCH: 33.9 pg (ref 26.0–34.0)
MCHC: 32.5 g/dL (ref 30.0–36.0)
MCV: 104.4 fL — ABNORMAL HIGH (ref 80.0–100.0)
Monocytes Absolute: 1.1 10*3/uL — ABNORMAL HIGH (ref 0.1–1.0)
Monocytes Relative: 12 %
Neutro Abs: 5.4 10*3/uL (ref 1.7–7.7)
Neutrophils Relative %: 57 %
Platelet Count: 159 10*3/uL (ref 150–400)
RBC: 3.42 MIL/uL — ABNORMAL LOW (ref 3.87–5.11)
RDW: 14.1 % (ref 11.5–15.5)
WBC Count: 9.4 10*3/uL (ref 4.0–10.5)
nRBC: 0 % (ref 0.0–0.2)

## 2020-03-17 NOTE — Progress Notes (Signed)
[facility]  [phone] [phone]     ID: Shirley Mays DOB: 1956-03-13  MR#: [id number]  EXH#:[id number]  Patient Care Team: Patient, No Pcp Per as PCP - General (General Practice) Noreene Filbert, NP OTHER MD:  CHIEF COMPLAINT: Macrocytic anemia  HISTORY OF CURRENT ILLNESS:  Shirley Mays is a pleasant 64 year old woman who is here today for consultation and evaluation for her macrocytic anemia, and previous leukocytosis, along with mild thromboctyopenia.  Shirley Mays tells me that she does not go undergo routine medical care, and she hasn't seen a health care provider in years.  She says that in mid June she developed lower extremity swelling and some pain and went to the hospital.  She was hospitalized from October 14, 2019 through October 23, 2019 for community acquired pneumonia.  She received IV antibiotics.  She also was found to have cirrhosis and ascites, and a paracentesis was performed that removed 1.2 liters from her abdomen.  She was discharged on Spironolactone and Lasix and her ascites has improved.  Shirley Mays has a h/o ETOH abuse.  She was drinking about 3-5 ETOH beverages per day, typically wine or liquor drinks.  She notes that she has quit drinking since her hospitalization.    Her labs were abnormal during her hospitalization and the trend is noted below.  Most importantly, her WBC is now normal.     Results for Shirley, Mays (MRN [id number]) as of 12/04/2019 13:49  Ref. Range 10/14/2019 23:57 10/15/2019 05:20 10/16/2019 05:00 10/17/2019 09:25 10/18/2019 03:35 10/19/2019 06:35 10/20/2019 03:58 10/21/2019 08:24 10/22/2019 03:40 10/23/2019 03:15 11/19/2019 14:04 12/04/2019 13:09  WBC Latest Ref Range: 4.0 - 10.5 K/uL 17.8 (H) 16.5 (H) 18.2 (H) 17.8 (H) 17.6 (H) 18.0 (H) 20.5 (H) 23.1 (H) 19.1 (H) 19.6 (H) 8.0 7.6  Results for Shirley Mays (MRN [id number]) as of 12/04/2019 13:49  Ref. Range 10/14/2019 23:57 10/15/2019 05:20 10/16/2019 05:00 10/17/2019 09:25 10/18/2019 03:35 10/19/2019  06:35 10/20/2019 03:58 10/20/2019 20:55 10/21/2019 08:24 10/22/2019 03:40 10/23/2019 03:15 11/19/2019 14:04 12/04/2019 13:09  Hemoglobin Latest Ref Range: 12.0 - 15.0 g/dL 42.3 (L) 53.6 (L) 14.4 (L) 11.0 (L) 10.1 (L) 9.8 (L) 9.6 (L) 10.0 (L) 10.7 (L) 9.6 (L) 10.2 (L) 9.7 (L) 9.6 (L)  Results for Shirley Mays (MRN [id number]) as of 12/04/2019 13:49  Ref. Range 10/14/2019 23:57 10/15/2019 05:20 10/16/2019 05:00 10/17/2019 09:25 10/18/2019 03:35 10/19/2019 06:35 10/20/2019 03:58 10/21/2019 08:24 10/22/2019 03:40 10/23/2019 03:15 11/19/2019 14:04 12/04/2019 13:09  MCV Latest Ref Range: 80.0 - 100.0 fL 108.7 (H) 108.2 (H) 105.6 (H) 103.4 (H) 104.0 (H) 104.6 (H) 103.9 (H) 106.3 (H) 106.2 (H) 106.6 (H) 104 (H) 105.2 (H)  Results for Shirley Mays (MRN [id number]) as of 12/04/2019 13:49  Ref. Range 10/14/2019 23:57 10/15/2019 05:20 10/16/2019 05:00 10/17/2019 09:25 10/18/2019 03:35 10/19/2019 06:35 10/20/2019 03:58 10/21/2019 08:24 10/22/2019 03:40 10/23/2019 03:15 11/19/2019 14:04 12/04/2019 13:09  Platelets Latest Ref Range: 150 - 400 K/uL 123 (L) 112 (L) 117 (L) 142 (L) 143 (L) 243 267 350 320 339 154 134 (L)    The patient's subsequent history is as detailed below.  INTERVAL HISTORY: Shirley Mays is here today for f/u of her anemia.  She underwent a liver biopsy with Dr. Adela Lank earlier this month that demonstrated cirrhosis likely secondary to ETOH, but ? Component of autoimmune hepatitis.  Dr. Adela Lank referred her to hepatology at [facility].  She sees them on 04/07/2020. She is due for colonoscopy and EGD, however she declined to schedule these with Dr.  Armbruster per his last phone note.    REVIEW OF SYSTEMS: Shirley Mays has some issues regarding her weight.  She is taking lasix, her albumin is low, and her weight continues to increase.  Her daughter Shirley Mays notes that she is confused as her PCP recommended she f/u with Dr. Adela LankArmbruster about this and Dr. Adela LankArmbruster recommended she f/u with her PCP about this.  Her potassium is 5.4 today and I verified with  Saryn that she is no longer taking her potassium supplementation.  She notes generalized aches and pains and wonders if we could refill her Norco today.  A detailed ROS was otherwise non contributory.    PAST MEDICAL HISTORY: Past Medical History:  Diagnosis Date  . Alcohol abuse   . Cirrhosis (HCC)   . Hiatal hernia   . Pneumonia   . Pyelonephritis   . Sigmoid diverticulosis     PAST SURGICAL HISTORY: Past Surgical History:  Procedure Laterality Date  . IR PARACENTESIS  10/16/2019  . IR TRANSCATHETER BX  02/20/2020  . IR US GUIDE VASC ACCESS RIGHT  02/20/2020  . IR VENOGRAM HEPATIC W HEMODYNAMIC EVALUATION  02/20/2020  . TUBAL LIGATION      FAMILY HISTORY Family History  Problem Relation Age of Onset  . Liver disease Mother   . Kidney disease Mother   . Heart disease Mother   . Cancer Neg Hx   . Anemia Neg Hx   . Leukemia Neg Hx   . Thrombocytopenia Neg Hx     GYNECOLOGIC HISTORY:  Menarche: 64 years old Age at first live birth: 64 years old GX P 3 LMP post menopausal HRT never  Hysterectomy? no Salpingo-oophorectomy?no    SOCIAL HISTORY: Shirley Mays is single and lives with her duaghter Joey who works running the office with Dr. Jillyn HiddenBean.  She has another son who is a marine and her third son is in [place] working with his father in Social workerlaw.  She is retired.  She smokes 3 cigarettes per day, but tells me at her heaviest she smoked 1/2 ppd for 30 years.  She denies any drug use, however previously used excess amounts of ETOH.  She has since quit.       ADVANCED DIRECTIVES: Not in place   HEALTH MAINTENANCE: Social History   Tobacco Use  . Smoking status: Current Every Day Smoker    Packs/day: 0.50    Years: 30.00    Pack years: 15.00    Types: Cigarettes  . Smokeless tobacco: Never Used  . Tobacco comment: 2-3 a day  Vaping Use  . Vaping Use: Never used  Substance Use Topics  . Alcohol use: Not Currently    Comment: previously drank heavy about 3-6  drinks/day  . Drug use: Never     Colonoscopy: never  PAP: 10 years ago  Mammo: 10 years ago  Bone density: never   No Known Allergies  Current Outpatient Medications  Medication Sig Dispense Refill  . cholestyramine (QUESTRAN) 4 g packet Take 1 packet (4 g total) by mouth 3 (three) times daily with meals. For itching 60 each 12  . ciprofloxacin (CIPRO) 500 MG tablet Take 500 mg by mouth daily with breakfast.    . furosemide (LASIX) 40 MG tablet Take 1 tablet (40 mg total) by mouth daily. 90 tablet 1  . HYDROcodone-acetaminophen (NORCO) 5-325 MG tablet 1/2 to 1 tablet every 4 hours prn pain 30 tablet 0  . Multiple Vitamin (MULTIVITAMIN WITH MINERALS) TABS tablet Take 1 tablet by  mouth daily.    Marland Kitchen thiamine 100 MG tablet Take 1 tablet (100 mg total) by mouth daily. 90 tablet 1  . potassium chloride SA (KLOR-CON) 20 MEQ tablet Take 1 tablet (20 mEq total) by mouth daily for 7 days. 7 tablet 0   No current facility-administered medications for this visit.    OBJECTIVE:  Vitals:   03/17/20 1040  BP: (!) 116/57  Pulse: 78  Resp: 18  Temp: 97.7 F (36.5 C)  SpO2: 100%     Body mass index is 29.91 kg/m.   Wt Readings from Last 3 Encounters:  03/17/20 193 lb 12.8 oz (87.9 kg)  02/20/20 187 lb (84.8 kg)  01/07/20 177 lb 6.4 oz (80.5 kg)      ECOG FS:1 - Symptomatic but completely ambulatory GENERAL: Patient is a well appearing female in no acute distress HEENT:  Sclerae anicteric.  Mask in place. Neck is supple.  NODES:  No cervical, supraclavicular, or axillary lymphadenopathy palpated.  BREAST EXAM:  Deferred. LUNGS:  Clear to auscultation bilaterally.  No wheezes or rhonchi. HEART:  Regular rate and rhythm. No murmur appreciated. ABDOMEN:  Soft, nontender.  Positive, normoactive bowel sounds. No organomegaly palpated. MSK:  No focal spinal tenderness to palpation.  EXTREMITIES: + peripheral edema in lower ext. SKIN:  Clear with no obvious rashes or skin changes. No nail  dyscrasia. Seborrheic keratoses on right upper abdomen, lower breast near inframammary fold NEURO:  Nonfocal. Well oriented.  Appropriate affect.     LAB RESULTS:     Chemistry    Appointment on 03/17/2020  Component Date Value Ref Range Status  . Sodium 03/17/2020 138  135 - 145 mmol/L Final  . Potassium 03/17/2020 5.4* 3.5 - 5.1 mmol/L Final  . Chloride 03/17/2020 109  98 - 111 mmol/L Final  . CO2 03/17/2020 24  22 - 32 mmol/L Final  . Glucose, Bld 03/17/2020 86  70 - 99 mg/dL Final   Glucose reference range applies only to samples taken after fasting for at least 8 hours.  . BUN 03/17/2020 11  8 - 23 mg/dL Final  . Creatinine 40/98/1191 0.84  0.44 - 1.00 mg/dL Final  . Calcium 47/82/9562 9.5  8.9 - 10.3 mg/dL Final  . Total Protein 03/17/2020 8.0  6.5 - 8.1 g/dL Final  . Albumin 13/11/6576 2.9* 3.5 - 5.0 g/dL Final  . AST 46/96/2952 52* 15 - 41 U/L Final  . ALT 03/17/2020 18  0 - 44 U/L Final  . Alkaline Phosphatase 03/17/2020 105  38 - 126 U/L Final  . Total Bilirubin 03/17/2020 1.9* 0.3 - 1.2 mg/dL Final  . GFR, Estimated 03/17/2020 >60  >60 mL/min Final   Comment: (NOTE) Calculated using the CKD-EPI Creatinine Equation (2021)   . Anion gap 03/17/2020 5  5 - 15 Final   Performed at [facility], [address] [place], [place] [address]  . WBC Count 03/17/2020 9.4  4.0 - 10.5 K/uL Final  . RBC 03/17/2020 3.42* 3.87 - 5.11 MIL/uL Final  . Hemoglobin 03/17/2020 11.6* 12.0 - 15.0 g/dL Final  . HCT 44/05/270 35.7* 36 - 46 % Final  . MCV 03/17/2020 104.4* 80.0 - 100.0 fL Final  . MCH 03/17/2020 33.9  26.0 - 34.0 pg Final  . MCHC 03/17/2020 32.5  30.0 - 36.0 g/dL Final  . RDW 53/66/4403 14.1  11.5 - 15.5 % Final  . Platelet Count 03/17/2020 159  150 - 400 K/uL Final  . nRBC 03/17/2020 0.0  0.0 - 0.2 % Final  . Neutrophils Relative % 03/17/2020 57  % Final  . Neutro Abs 03/17/2020 5.4  1.7 - 7.7 K/uL Final  . Lymphocytes Relative 03/17/2020 28  %  Final  . Lymphs Abs 03/17/2020 2.6  0.7 - 4.0 K/uL Final  . Monocytes Relative 03/17/2020 12  % Final  . Monocytes Absolute 03/17/2020 1.1* 0.1 - 1.0 K/uL Final  . Eosinophils Relative 03/17/2020 2  % Final  . Eosinophils Absolute 03/17/2020 0.2  0.0 - 0.5 K/uL Final  . Basophils Relative 03/17/2020 1  % Final  . Basophils Absolute 03/17/2020 0.1  0.0 - 0.1 K/uL Final  . Immature Granulocytes 03/17/2020 0  % Final  . Abs Immature Granulocytes 03/17/2020 0.02  0.00 - 0.07 K/uL Final   Performed at [facility], [address], [place], [place] [address]    Urinalysis   STUDIES: CLINICAL DATA:  64 year old female with with abdominal distension and leukocytosis.  EXAM: CT ABDOMEN AND PELVIS WITH CONTRAST  TECHNIQUE: Multidetector CT imaging of the abdomen and pelvis was performed using the standard protocol following bolus administration of intravenous contrast.  CONTRAST:  OMNIPAQUE IOHEXOL 300 MG/ML  SOLN  COMPARISON:  Right upper quadrant ultrasound dated 10/15/2019.  FINDINGS: Lower chest: Partially visualized small left pleural effusion. There is patchy area of consolidative changes of the left lower lobe with air bronchogram which may represent atelectasis or pneumonia. Clinical correlation is recommended.  No intra-abdominal free air. Small ascites.  Hepatobiliary: Cirrhosis. No intrahepatic biliary ductal dilatation. There is small amount of sludge within the gallbladder. No calcified gallstone.  Pancreas: Unremarkable. No pancreatic ductal dilatation or surrounding inflammatory changes.  Spleen: Indeterminate 2 cm splenic hypodense lesion the the  Adrenals/Urinary Tract: The adrenal glands are unremarkable. There is no hydronephrosis on either side. There is heterogeneous enhancement of the renal parenchyma most consistent with pyelonephritis. Correlation with urinalysis recommended. No drainable fluid collection or  abscess. The visualized ureters and urinary bladder appear unremarkable.  Stomach/Bowel: There is sigmoid diverticulosis. There is a small hiatal hernia. There is no bowel obstruction. The appendix is normal.  Vascular/Lymphatic: Mild aortoiliac atherosclerotic disease. The IVC is unremarkable. No portal venous gas. There is no adenopathy.  Reproductive: The uterus and ovaries are grossly unremarkable.  Other: Diffuse subcutaneous edema and anasarca.  Musculoskeletal: No acute or significant osseous findings.  IMPRESSION: 1. Findings most consistent with bilateral pyelonephritis. Correlation with urinalysis recommended. No drainable fluid collection or abscess. 2. Cirrhosis with small ascites and anasarca. 3. Sigmoid diverticulosis. No bowel obstruction. Normal appendix. 4. Aortic Atherosclerosis (ICD10-I70.0).   Electronically Signed   By: Elgie Collard M.D.   On: 10/21/2019 21:57   ASSESSMENT: 64 y.o. woman with no recent medical care, being evaluated today for anemia.  1. Macrocytic Anemia  (a) Likely secondary to ETOH use and cirrhosis  (b) Liver biopsy confirms cirrhosis, possible autoimmune hepatitis, referred to [facility] hepatology  (c) declined colonoscopy and EGD in 03/2020 with Dr. Adela Lank  2. Mild thrombocytopenia  (a) likely secondary to ETOH use and cirrhosis  PLAN:  Ryan is doing quite well from a hematologic perspective.  Her labs have greatly improved and this is likely secondary to her ETOH cessation.  I encouarged her to continue to abstain from ETOH.    Dahiana has continued issues with her liver and fluid balance management.  Today her potassium is 5.4 and her weight is increased by about 15 pounds in the past 2 months.  I recommended that she take her lasix BID instead of daily, and that she weigh herself daily.  I recommended she f/u with Dr. Alvis Lemmings next week and I sent a staff message to him regarding this.    Her pain is unrelated to  her hematology issue.  PMP aware was reviewed and she has no red flags, however, I let her know that I am limited to refill the Norco for her since it isn't related to her blood counts.  I recommended she f/u with her PCP about this.    We discussed our plan moving forward. I recommend that she have her CBC with differential checked every 3 months when she undergoes her lab checks with PCP.  We can see her back in one year.  She will likely be released at that time.    She knows to call for any questions that may arise between now and her next appointment.  We are happy to see her sooner if needed.  Total encounter time: 20 minutes*  Lillard Anes, NP 03/17/20 11:38 AM Medical Oncology and Hematology [facility] [address] [place], [place] [address] Tel. [phone]    Fax. [phone]  *Total Encounter Time as defined by the Centers for Medicare and Medicaid Services includes, in addition to the face-to-face time of a patient visit (documented in the note above) non-face-to-face time: obtaining and reviewing outside history, ordering and reviewing medications, tests or procedures, care coordination (communications with other health care professionals or caregivers) and documentation in the medical record.

## 2020-03-18 ENCOUNTER — Telehealth: Payer: Self-pay | Admitting: Oncology

## 2020-03-18 NOTE — Telephone Encounter (Signed)
Scheduled appts per 11/17 los. Left voicemail with appt dates and times.  

## 2020-03-19 ENCOUNTER — Telehealth: Payer: Self-pay

## 2020-03-19 NOTE — Telephone Encounter (Signed)
Received fax that patient has been scheduled for an appointment at [facility] on 04/07/20 at 2:15 PM.

## 2020-04-14 ENCOUNTER — Telehealth: Payer: Self-pay | Admitting: Gastroenterology

## 2020-04-14 NOTE — Telephone Encounter (Signed)
[place]lyn can you see if this patient is willing to schedule EGD and colonoscopy at the [facility]? She is overdue for this, did not want to scheduled at clinic visit. She has seen hepatology and ask that we reach out to her again about scheduling. Thanks

## 2020-04-14 NOTE — Telephone Encounter (Signed)
Lm on vm for patient to return call 

## 2020-04-15 NOTE — Telephone Encounter (Signed)
Pt is requesting a call back from a nurse (missed call) 

## 2020-04-15 NOTE — Telephone Encounter (Signed)
Spoke with the patient, she states that she is not ready to schedule procedures at this time. Patient states that she received the wrong Medicaid card and had to send more information in for insurance coverage. She states that she will call us at the beginning of the year, pt wants to make sure she has coverage before proceeding.   Will place a reminder in epic to check and see if patient has been scheduled.

## 2020-04-15 NOTE — Telephone Encounter (Signed)
Reminder in epic °

## 2020-04-15 NOTE — Telephone Encounter (Signed)
Okay thanks [employer]. Can you please place a reminder to contact her in January sometime to ask when she is ready to do these? This is an important procedure for her. Thanks

## 2020-04-15 NOTE — Telephone Encounter (Signed)
Lm on vm for patient to return call 

## 2020-05-11 ENCOUNTER — Telehealth: Payer: Self-pay

## 2020-05-11 NOTE — Telephone Encounter (Signed)
Lm on vm for patient to return call 

## 2020-05-11 NOTE — Telephone Encounter (Signed)
-----   Message from Missy Sabins, RN sent at 04/15/2020 11:51 AM EST ----- Regarding: EGD/COLON See if patient has scheduled EGD/Colon in the [facility]. Previously waiting on insurance coverage.

## 2020-05-12 ENCOUNTER — Telehealth: Payer: Self-pay | Admitting: Family Medicine

## 2020-05-12 NOTE — Telephone Encounter (Signed)
Lm on vm for patient to return call 

## 2020-05-18 NOTE — Telephone Encounter (Signed)
Lm on vm for patient to return call. Patient will be due for a 3 month follow up in February to further discuss procedures.

## 2020-06-02 ENCOUNTER — Other Ambulatory Visit: Payer: Self-pay

## 2020-06-02 ENCOUNTER — Ambulatory Visit: Payer: Medicare Other | Attending: Family Medicine | Admitting: Family Medicine

## 2020-06-02 ENCOUNTER — Encounter: Payer: Self-pay | Admitting: Family Medicine

## 2020-06-02 VITALS — BP 123/73 | HR 81 | Ht 67.0 in | Wt 212.0 lb

## 2020-06-02 DIAGNOSIS — Z79899 Other long term (current) drug therapy: Secondary | ICD-10-CM | POA: Diagnosis not present

## 2020-06-02 DIAGNOSIS — K7031 Alcoholic cirrhosis of liver with ascites: Secondary | ICD-10-CM

## 2020-06-02 DIAGNOSIS — E875 Hyperkalemia: Secondary | ICD-10-CM | POA: Diagnosis not present

## 2020-06-02 DIAGNOSIS — M25569 Pain in unspecified knee: Secondary | ICD-10-CM | POA: Diagnosis present

## 2020-06-02 DIAGNOSIS — R0609 Other forms of dyspnea: Secondary | ICD-10-CM | POA: Diagnosis not present

## 2020-06-02 DIAGNOSIS — M2550 Pain in unspecified joint: Secondary | ICD-10-CM | POA: Diagnosis not present

## 2020-06-02 DIAGNOSIS — R0600 Dyspnea, unspecified: Secondary | ICD-10-CM | POA: Diagnosis not present

## 2020-06-02 DIAGNOSIS — M25562 Pain in left knee: Secondary | ICD-10-CM | POA: Diagnosis not present

## 2020-06-02 DIAGNOSIS — M25561 Pain in right knee: Secondary | ICD-10-CM | POA: Diagnosis not present

## 2020-06-02 DIAGNOSIS — M7501 Adhesive capsulitis of right shoulder: Secondary | ICD-10-CM

## 2020-06-02 DIAGNOSIS — M7502 Adhesive capsulitis of left shoulder: Secondary | ICD-10-CM | POA: Diagnosis not present

## 2020-06-02 MED ORDER — DICLOFENAC SODIUM 1 % EX GEL: 4.0000 g | Freq: Four times a day (QID) | CUTANEOUS | 1 refills | Status: DC

## 2020-06-02 MED ORDER — FUROSEMIDE 40 MG PO TABS: 40.0000 mg | ORAL_TABLET | Freq: Every day | ORAL | 1 refills | Status: DC

## 2020-06-02 NOTE — Progress Notes (Signed)
Still having pain in joints. Has SOB getting in and out the car.

## 2020-06-02 NOTE — Patient Instructions (Signed)
Adhesive Capsulitis  Adhesive capsulitis, also called frozen shoulder, causes the shoulder to become stiff and painful to move. This condition happens when there is inflammation of the tendons and ligaments that surround the shoulder joint (shoulder capsule). What are the causes? This condition may be caused by:  An injury to your shoulder joint.  Straining your shoulder.  Not moving your shoulder for a period of time. This can happen if your arm was injured or in a sling.  Long-standing conditions, such as: ? Diabetes. ? Thyroid problems. ? Heart disease. ? Stroke. ? Rheumatoid arthritis. ? Lung disease. In some cases, the cause is not known. What increases the risk? You are more likely to develop this condition if you are:  A woman.  Older than 65 years of age. What are the signs or symptoms? Symptoms of this condition include:  Pain in your shoulder when you move your arm. There may also be pain when parts of your shoulder are touched. The pain may be worse at night or when you are resting.  A sore or aching shoulder.  The inability to move your shoulder normally.  Muscle spasms. How is this diagnosed? This condition is diagnosed with a physical exam and imaging tests, such as an X-ray or MRI. How is this treated? This condition may be treated with:  Treatment of the underlying cause or condition.  Medicine. Medicine may be given to relieve pain, inflammation, or muscle spasms.  Steroid injections into the shoulder joint.  Physical therapy. This involves performing exercises to get the shoulder moving again.  Acupuncture. This is a type of treatment that involves stimulating specific points on your body by inserting thin needles through your skin.  Shoulder manipulation. This is a procedure to move the shoulder into another position. It is done after you are given a medicine to make you fall asleep (general anesthetic). The joint may also be injected with salt  water at high pressure to break down scarring.  Surgery. This may be done in severe cases when other treatments have failed. Although most people recover completely from adhesive capsulitis, some may not regain full shoulder movement. Follow these instructions at home: Managing pain, stiffness, and swelling  If directed, put ice on the injured area: ? Put ice in a plastic bag. ? Place a towel between your skin and the bag. ? Leave the ice on for 20 minutes, 2-3 times per day.  If directed, apply heat to the affected area before you exercise. Use the heat source that your health care provider recommends, such as a moist heat pack or a heating pad. ? Place a towel between your skin and the heat source. ? Leave the heat on for 20-30 minutes. ? Remove the heat if your skin turns bright red. This is especially important if you are unable to feel pain, heat, or cold. You may have a greater risk of getting burned.      General instructions  Take over-the-counter and prescription medicines only as told by your health care provider.  If you are being treated with physical therapy, follow instructions from your physical therapist.  Avoid exercises that put a lot of demand on your shoulder, such as throwing. These exercises can make pain worse.  Keep all follow-up visits as told by your health care provider. This is important. Contact a health care provider if:  You develop new symptoms.  Your symptoms get worse. Summary  Adhesive capsulitis, also called frozen shoulder, causes the shoulder to  become stiff and painful to move.  You are more likely to have this condition if you are a woman and over age 40.  It is treated with physical therapy, medicines, and sometimes surgery. This information is not intended to replace advice given to you by your health care provider. Make sure you discuss any questions you have with your health care provider. Document Revised: 09/21/2017 Document  Reviewed: 09/21/2017 Elsevier Patient Education  2021 [employer]  

## 2020-06-02 NOTE — Progress Notes (Signed)
Subjective:  Patient ID: Shirley Mays, female    DOB: March 13, 1956  Age: 65 y.o. MRN: [id number]  CC: Joint Pain   HPI [facility] Kuznia is a 65 year old female with a history of alcohol abuse, alcoholic cirrhosis of the liver with ascites here for follow-up visit. She was seen at the liver transplant clinic with [facility] in 03/2020 and for patient she was informed she was not a candidate for liver transplant.  It looks like she has not been to see her GI specialist Dr. Adela Lank in a while notes from epic from 03/2020 review GI attempted to call her to schedule EGD and colonoscopy which she stated she was not ready for.  She has gets dyspneic on mild exertion; ran out of Lasix 2 days ago. She has pain in her knee joints and this causes difficulty getting in and out of the car and also has shoulder pains. She fell when she went for her MRI in 12/2019 and has pain ever since as she twisted her ankle as well and her right upper extremity went through the bars.  She has stiffness in both shoulders. She ran out of her Lasix 2 days ago. Accompanied by her daughter to today's visit.  Past Medical History:  Diagnosis Date  . Alcohol abuse   . Cirrhosis (HCC)   . Hiatal hernia   . Pneumonia   . Pyelonephritis   . Sigmoid diverticulosis     Past Surgical History:  Procedure Laterality Date  . IR PARACENTESIS  10/16/2019  . IR TRANSCATHETER BX  02/20/2020  . IR US GUIDE VASC ACCESS RIGHT  02/20/2020  . IR VENOGRAM HEPATIC W HEMODYNAMIC EVALUATION  02/20/2020  . TUBAL LIGATION      Family History  Problem Relation Age of Onset  . Liver disease Mother   . Kidney disease Mother   . Heart disease Mother   . Cancer Neg Hx   . Anemia Neg Hx   . Leukemia Neg Hx   . Thrombocytopenia Neg Hx     No Known Allergies  Outpatient Medications Prior to Visit  Medication Sig Dispense Refill  . cholestyramine (QUESTRAN) 4 g packet Take 1 packet (4 g total) by mouth 3 (three) times daily with  meals. For itching 60 each 12  . furosemide (LASIX) 40 MG tablet Take 1 tablet (40 mg total) by mouth daily. 90 tablet 1  . ciprofloxacin (CIPRO) 500 MG tablet Take 500 mg by mouth daily with breakfast. (Patient not taking: Reported on 06/02/2020)    . HYDROcodone-acetaminophen (NORCO) 5-325 MG tablet 1/2 to 1 tablet every 4 hours prn pain (Patient not taking: Reported on 06/02/2020) 30 tablet 0  . Multiple Vitamin (MULTIVITAMIN WITH MINERALS) TABS tablet Take 1 tablet by mouth daily.    Marland Kitchen thiamine 100 MG tablet Take 1 tablet (100 mg total) by mouth daily. 90 tablet 1  . potassium chloride SA (KLOR-CON) 20 MEQ tablet Take 1 tablet (20 mEq total) by mouth daily for 7 days. 7 tablet 0   No facility-administered medications prior to visit.     ROS Review of Systems  Constitutional: Negative for activity change, appetite change and fatigue.  HENT: Negative for congestion, sinus pressure and sore throat.   Eyes: Negative for visual disturbance.  Respiratory: Negative for cough, chest tightness, shortness of breath and wheezing.   Cardiovascular: Negative for chest pain and palpitations.  Gastrointestinal: Negative for abdominal distention, abdominal pain and constipation.  Endocrine: Negative for polydipsia.  Genitourinary: Negative  for dysuria and frequency.  Musculoskeletal:       See HPI  Skin: Negative for rash.  Neurological: Negative for tremors, light-headedness and numbness.  Hematological: Does not bruise/bleed easily.  Psychiatric/Behavioral: Negative for agitation and behavioral problems.    Objective:  BP 123/73   Pulse 81   Ht 5\' 7"  (1.702 m)   Wt 212 lb (96.2 kg)   SpO2 98%   BMI 33.20 kg/m   BP/Weight 06/02/2020 03/17/2020 02/20/2020  Systolic BP 123 116 137  Diastolic BP 73 57 59  Wt. (Lbs) 212 193.8 187  BMI 33.2 29.91 28.86      Physical Exam Constitutional:      Appearance: She is well-developed.  Neck:     Vascular: No JVD.  Cardiovascular:     Rate and  Rhythm: Normal rate.     Heart sounds: Normal heart sounds. No murmur heard.   Pulmonary:     Effort: Pulmonary effort is normal.     Breath sounds: Normal breath sounds. No wheezing or rales.  Chest:     Chest wall: No tenderness.  Abdominal:     General: Bowel sounds are normal. There is no distension.     Palpations: Abdomen is soft. There is no mass.     Tenderness: There is no abdominal tenderness.  Musculoskeletal:        General: Normal range of motion.     Right lower leg: Edema (2+) present.     Left lower leg: Edema (2+) present.     Comments: Range of motion of left shoulder restricted to 100 degrees and range of motion of right shoulder restricted to 60 degrees Normal appearance of both knees with no tenderness on range of motion Tenderness on range of motion of left ankle  Neurological:     Mental Status: She is alert and oriented to person, place, and time.  Psychiatric:        Mood and Affect: Mood normal.     CMP Latest Ref Rng & Units 03/17/2020 02/20/2020 01/19/2020  Glucose 70 - 99 mg/dL 86 96 01/21/2020)  BUN 8 - 23 mg/dL 11 18 12   Creatinine 0.44 - 1.00 mg/dL 182(X 9.37  Sodium 135 - 145 mmol/L 138 136 136  Potassium 3.5 - 5.1 mmol/L 5.4(H) 4.0 3.9  Chloride 98 - 111 mmol/L 109 108 106  CO2 22 - 32 mmol/L 24 20(L) 24  Calcium 8.9 - 10.3 mg/dL 9.5 9.2 8.6  Total Protein 6.5 - 8.1 g/dL 8.0 7.3 -  Total Bilirubin 0.3 - 1.2 mg/dL 1.69) 2.7(H) -  Alkaline Phos 38 - 126 U/L 105 67 -  AST 15 - 41 U/L 52(H) 54(H) -  ALT 0 - 44 U/L 18 24 -    Lipid Panel  No results found for: CHOL, TRIG, HDL, CHOLHDL, VLDL, LDLCALC, LDLDIRECT  CBC    Component Value Date/Time   WBC 9.4 03/17/2020 1015   WBC 8.5 02/20/2020 0833   RBC 3.42 (L) 03/17/2020 1015   HGB 11.6 (L) 03/17/2020 1015   HGB 9.7 (L) 11/19/2019 1404   HCT 35.7 (L) 03/17/2020 1015   HCT 26.4 (L) 11/19/2019 1404   PLT 159 03/17/2020 1015   PLT 154 11/19/2019 1404   MCV 104.4 (H) 03/17/2020 1015    MCV 104 (H) 11/19/2019 1404   MCH 33.9 03/17/2020 1015   MCHC 32.5 03/17/2020 1015   RDW 14.1 03/17/2020 1015   RDW 11.7 11/19/2019 1404   LYMPHSABS 2.6 03/17/2020 1015  LYMPHSABS 1.9 11/19/2019 1404   MONOABS 1.1 (H) 03/17/2020 1015   EOSABS 0.2 03/17/2020 1015   EOSABS 0.1 11/19/2019 1404   BASOSABS 0.1 03/17/2020 1015   BASOSABS 0.1 11/19/2019 1404    No results found for: HGBA1C  Assessment & Plan:  1. Alcoholic cirrhosis of liver with ascites (HCC) Not a candidate for liver transplant Currently on Lasix; per patient's daughter spironolactone was discontinued by GI due to hyperkalemia Advised of the importance of scheduling a GI appointment and daughter promises to do so - Basic Metabolic Panel - furosemide (LASIX) 40 MG tablet; Take 1 tablet (40 mg total) by mouth daily.  Dispense: 90 tablet; Refill: 1  2. Arthralgia, unspecified joint We will hold off on NSAIDs due to her daughter stating she has had GI bleed in the past Will place on topical NSAID - diclofenac Sodium (VOLTAREN) 1 % GEL; Apply 4 g topically 4 (four) times daily.  Dispense: 100 g; Refill: 1 - Ambulatory referral to Physical Therapy  3. Adhesive capsulitis of both shoulders Will benefit from PT Topical NSAID initiated  4. Other form of dyspnea She has gained 19 pounds over the last 4 months This could explain her dyspnea She also needs to work on being more active    Meds ordered this encounter  Medications  . diclofenac Sodium (VOLTAREN) 1 % GEL    Sig: Apply 4 g topically 4 (four) times daily.    Dispense:  100 g    Refill:  1  . furosemide (LASIX) 40 MG tablet    Sig: Take 1 tablet (40 mg total) by mouth daily.    Dispense:  90 tablet    Refill:  1    Follow-up: Return in about 3 months (around 08/30/2020) for Chronic disease management.       Hoy Register, MD, FAAFP. [facility] [place], [place] [phone]   06/02/2020, 12:00 PM

## 2020-06-03 ENCOUNTER — Telehealth: Payer: Self-pay

## 2020-06-03 LAB — BASIC METABOLIC PANEL
BUN/Creatinine Ratio: 16 (ref 12–28)
BUN: 13 mg/dL (ref 8–27)
CO2: 20 mmol/L (ref 20–29)
Calcium: 9.6 mg/dL (ref 8.7–10.3)
Chloride: 106 mmol/L (ref 96–106)
Creatinine, Ser: 0.81 mg/dL (ref 0.57–1.00)
GFR calc Af Amer: 88 mL/min/{1.73_m2} (ref >59)
GFR calc non Af Amer: 76 mL/min/{1.73_m2} (ref >59)
Glucose: 86 mg/dL (ref 65–99)
Potassium: 4.5 mmol/L (ref 3.5–5.2)
Sodium: 139 mmol/L (ref 134–144)

## 2020-06-03 NOTE — Telephone Encounter (Signed)
-----   Message from Hoy Register, MD sent at 06/03/2020 12:15 PM EST ----- Please inform the patient that labs are normal. Thank you.

## 2020-06-03 NOTE — Telephone Encounter (Signed)
Pt was called and a VM was left informing pt that results were normal.

## 2020-06-14 ENCOUNTER — Telehealth: Payer: Self-pay

## 2020-06-14 NOTE — Telephone Encounter (Signed)
-----   Message from Lamona Curl, [place] sent at 01/29/2020  9:38 AM EDT ----- Regarding: 3rd Twinrix Patient had 1st Twinrix on 12-29-19, second on 01-29-20.  She will be due for 3rd in 6 mo and 1 day.  Thanks

## 2020-06-14 NOTE — Telephone Encounter (Signed)
Called patient but was unable to leave a message.  Mailed patient a letter and sent patient a message on MyChart that she has been scheduled for her 3rd and final Twinrix on 06-30-20. She may reschedule to a day later in that week or the next week if more convenient but it is ideal to stay as close to 3-2 as she can.

## 2020-06-30 ENCOUNTER — Ambulatory Visit (INDEPENDENT_AMBULATORY_CARE_PROVIDER_SITE_OTHER): Payer: Medicare Other | Admitting: Gastroenterology

## 2020-06-30 DIAGNOSIS — R188 Other ascites: Secondary | ICD-10-CM | POA: Diagnosis not present

## 2020-06-30 DIAGNOSIS — K7460 Unspecified cirrhosis of liver: Secondary | ICD-10-CM | POA: Diagnosis not present

## 2020-06-30 DIAGNOSIS — Z23 Encounter for immunization: Secondary | ICD-10-CM | POA: Diagnosis not present

## 2020-07-08 ENCOUNTER — Telehealth: Payer: Self-pay

## 2020-07-08 DIAGNOSIS — K7460 Unspecified cirrhosis of liver: Secondary | ICD-10-CM

## 2020-07-08 DIAGNOSIS — R188 Other ascites: Secondary | ICD-10-CM

## 2020-07-08 DIAGNOSIS — R772 Abnormality of alphafetoprotein: Secondary | ICD-10-CM

## 2020-07-08 NOTE — Telephone Encounter (Signed)
Patient has been scheduled for a RUQ [place] at [facility] on Tuesday, 07/20/20 at 9:30 AM, arrive at 9:15 AM. NPO after midnight.   Lm on vm for patient to return call.

## 2020-07-08 NOTE — Telephone Encounter (Signed)
-----   Message from Missy Sabins, RN sent at 01/09/2020  9:45 AM EDT ----- Regarding: [place] RUQ [place], see MRI result note

## 2020-07-09 NOTE — Telephone Encounter (Signed)
Lm on vm for patient to return call 

## 2020-07-13 NOTE — Telephone Encounter (Signed)
Lm on vm for patient's daughter, Ander Slade, to return call.

## 2020-07-19 NOTE — Telephone Encounter (Signed)
Patient returned call, she has been provided with the central scheduling number to reschedule at her convenience.

## 2020-07-20 ENCOUNTER — Ambulatory Visit (HOSPITAL_COMMUNITY)
Admission: RE | Admit: 2020-07-20 | Discharge: 2020-07-20 | Disposition: A | Discharge status: Home / Self Care | Payer: Medicare Other | Source: Ambulatory Visit | Attending: Gastroenterology | Admitting: Gastroenterology

## 2020-07-20 ENCOUNTER — Other Ambulatory Visit: Payer: Self-pay

## 2020-07-20 DIAGNOSIS — R188 Other ascites: Secondary | ICD-10-CM | POA: Diagnosis present

## 2020-07-20 DIAGNOSIS — R772 Abnormality of alphafetoprotein: Secondary | ICD-10-CM | POA: Diagnosis present

## 2020-07-20 DIAGNOSIS — K7460 Unspecified cirrhosis of liver: Secondary | ICD-10-CM

## 2020-07-23 ENCOUNTER — Other Ambulatory Visit: Payer: Self-pay

## 2020-07-23 DIAGNOSIS — K7460 Unspecified cirrhosis of liver: Secondary | ICD-10-CM

## 2020-07-23 DIAGNOSIS — R1011 Right upper quadrant pain: Secondary | ICD-10-CM

## 2020-07-26 ENCOUNTER — Other Ambulatory Visit (INDEPENDENT_AMBULATORY_CARE_PROVIDER_SITE_OTHER): Payer: Medicare Other

## 2020-07-26 DIAGNOSIS — R188 Other ascites: Secondary | ICD-10-CM | POA: Diagnosis not present

## 2020-07-26 DIAGNOSIS — R1011 Right upper quadrant pain: Secondary | ICD-10-CM | POA: Diagnosis not present

## 2020-07-26 DIAGNOSIS — K7460 Unspecified cirrhosis of liver: Secondary | ICD-10-CM | POA: Diagnosis not present

## 2020-07-26 LAB — PROTIME-INR
INR: 1.5 ratio — ABNORMAL HIGH (ref 0.8–1.0)
Prothrombin Time: 16.9 s — ABNORMAL HIGH (ref 9.6–13.1)

## 2020-07-26 LAB — CBC WITH DIFFERENTIAL/PLATELET
Basophils Absolute: 0.1 10*3/uL (ref 0.0–0.1)
Basophils Relative: 0.9 % (ref 0.0–3.0)
Eosinophils Absolute: 0.2 10*3/uL (ref 0.0–0.7)
Eosinophils Relative: 2.3 % (ref 0.0–5.0)
HCT: 38.7 % (ref 36.0–46.0)
Hemoglobin: 13.2 g/dL (ref 12.0–15.0)
Lymphocytes Relative: 33.5 % (ref 12.0–46.0)
Lymphs Abs: 2.6 10*3/uL (ref 0.7–4.0)
MCHC: 34.2 g/dL (ref 30.0–36.0)
MCV: 94.5 fl (ref 78.0–100.0)
Monocytes Absolute: 0.6 10*3/uL (ref 0.1–1.0)
Monocytes Relative: 7.1 % (ref 3.0–12.0)
Neutro Abs: 4.4 10*3/uL (ref 1.4–7.7)
Neutrophils Relative %: 56.2 % (ref 43.0–77.0)
Platelets: 149 10*3/uL — ABNORMAL LOW (ref 150.0–400.0)
RBC: 4.09 Mil/uL (ref 3.87–5.11)
RDW: 13.1 % (ref 11.5–15.5)
WBC: 7.9 10*3/uL (ref 4.0–10.5)

## 2020-07-26 LAB — HEPATIC FUNCTION PANEL
ALT: 18 U/L (ref 0–35)
AST: 39 U/L — ABNORMAL HIGH (ref 0–37)
Albumin: 3.7 g/dL (ref 3.5–5.2)
Alkaline Phosphatase: 87 U/L (ref 39–117)
Bilirubin, Direct: 0.5 mg/dL — ABNORMAL HIGH (ref 0.0–0.3)
Total Bilirubin: 1.7 mg/dL — ABNORMAL HIGH (ref 0.2–1.2)
Total Protein: 8.1 g/dL (ref 6.0–8.3)

## 2020-07-28 ENCOUNTER — Other Ambulatory Visit: Payer: Self-pay

## 2020-07-28 DIAGNOSIS — R1011 Right upper quadrant pain: Secondary | ICD-10-CM

## 2020-07-28 DIAGNOSIS — K838 Other specified diseases of biliary tract: Secondary | ICD-10-CM

## 2020-07-28 DIAGNOSIS — R772 Abnormality of alphafetoprotein: Secondary | ICD-10-CM

## 2020-08-05 ENCOUNTER — Other Ambulatory Visit: Payer: Self-pay | Admitting: Gastroenterology

## 2020-08-05 ENCOUNTER — Ambulatory Visit (HOSPITAL_COMMUNITY)
Admission: RE | Admit: 2020-08-05 | Discharge: 2020-08-05 | Disposition: A | Discharge status: Home / Self Care | Payer: Medicare Other | Source: Ambulatory Visit | Attending: Gastroenterology | Admitting: Gastroenterology

## 2020-08-05 ENCOUNTER — Other Ambulatory Visit: Payer: Self-pay

## 2020-08-05 DIAGNOSIS — R1011 Right upper quadrant pain: Secondary | ICD-10-CM | POA: Diagnosis present

## 2020-08-05 DIAGNOSIS — R772 Abnormality of alphafetoprotein: Secondary | ICD-10-CM

## 2020-08-05 DIAGNOSIS — K838 Other specified diseases of biliary tract: Secondary | ICD-10-CM

## 2020-08-05 MED ORDER — GADOBUTROL 1 MMOL/ML IV SOLN
9.0000 mL | Freq: Once | INTRAVENOUS | Status: AC | PRN
  Administered 2020-08-05: 9 mL via INTRAVENOUS

## 2020-08-10 ENCOUNTER — Other Ambulatory Visit: Payer: Self-pay

## 2020-08-10 DIAGNOSIS — R1011 Right upper quadrant pain: Secondary | ICD-10-CM

## 2020-08-10 DIAGNOSIS — K838 Other specified diseases of biliary tract: Secondary | ICD-10-CM

## 2020-08-10 DIAGNOSIS — K7031 Alcoholic cirrhosis of liver with ascites: Secondary | ICD-10-CM

## 2020-08-10 DIAGNOSIS — K7460 Unspecified cirrhosis of liver: Secondary | ICD-10-CM

## 2020-08-18 ENCOUNTER — Encounter: Payer: Self-pay | Admitting: Gastroenterology

## 2020-08-18 ENCOUNTER — Other Ambulatory Visit (INDEPENDENT_AMBULATORY_CARE_PROVIDER_SITE_OTHER): Payer: Medicare Other

## 2020-08-18 ENCOUNTER — Other Ambulatory Visit: Payer: Self-pay

## 2020-08-18 ENCOUNTER — Ambulatory Visit (INDEPENDENT_AMBULATORY_CARE_PROVIDER_SITE_OTHER): Payer: Medicare Other | Admitting: Gastroenterology

## 2020-08-18 VITALS — BP 126/68 | HR 70 | Ht 67.0 in | Wt 210.0 lb

## 2020-08-18 DIAGNOSIS — K7030 Alcoholic cirrhosis of liver without ascites: Secondary | ICD-10-CM

## 2020-08-18 DIAGNOSIS — R932 Abnormal findings on diagnostic imaging of liver and biliary tract: Secondary | ICD-10-CM

## 2020-08-18 LAB — PROTIME-INR
INR: 1.5 ratio — ABNORMAL HIGH (ref 0.8–1.0)
Prothrombin Time: 16.3 s — ABNORMAL HIGH (ref 9.6–13.1)

## 2020-08-18 LAB — FERRITIN: Ferritin: 108.1 ng/mL (ref 10.0–291.0)

## 2020-08-18 NOTE — Progress Notes (Signed)
HPI :  65 year old female here for follow-up visit for cirrhosis.  Recall that she was admitted in late June for pneumonia.  During that time she had a CT scan obtained of the abdomen and pelvis which showed bilateral pyelonephritis, she was also incidentally noted to have cirrhosis with ascites and anasarca.  She underwent paracentesis which showed albumin undetectable, total protein undetectable, no evidence of SBP.  Cytology negative.  She was noted to have thrombocytopenia and anemia with macrocytosis and associated folate deficiency.  B12 levels normal.  She was seen by hematology who thought her hematologic abnormalities are likely related to underlying cirrhosis.  She had iron studies done showing an iron saturation of 60% and markedly elevated ferritin of 1379.  During her hospitalization she had a bilirubinemia that peaked to the eights, has since down trended.  She also had an AST over ALT predominance with hypoalbuminemia.  Alkaline phosphatase was normal.  CT scan showed no evidence of biliary ductal dilation.  Ultrasound showed stones in the gallbladder.  At that time she endorsed longstanding alcohol use, anywhere from 3-5 drinks a day, with higher volumes on the weekends.  Mother also had cirrhosis of the liver due to alcohol.  After her initial intake with me she underwent additional blood work to evaluate for other causes of liver disease.  She was found to have a mildly positive ANA but a strongly positive smooth muscle antibody and immunoglobulin level over 2000.  This led to a transjugular liver biopsy which confirmed cirrhosis, suspected due to be alcohol however raise the question of underlying mild interface hepatitis.  I referred her to hepatology in light of this finding for assessment of possible underlying autoimmune hepatitis.  She was seen by [facility] in December who felt that she more than likely has alcoholic cirrhosis, may have component of burned-out autoimmune hepatitis  but given her transaminases were low they did not recommend treatment with immunosuppressive therapy.  They recommended a follow-up clinic visit in 3 months but she did not do this..  The patient in general has been doing much better.  She has been completely abstinent from alcohol since her hospitalization last year.  She no longer has any ascites.  She is taking Lasix 40 mg a day for edema in her lower legs which has mostly resolved.  She previously was on Aldactone but this was discontinued due to hyperkalemia.  She states she is on a low-sodium diet, has been eating much healthier and generally doing much better.  Her jaundice has resolved.  She does not have any abdominal pains.  She has been vaccinated hepatitis A and B.  She underwent [place] screening with an ultrasound in March which showed a dilated common bile duct to 87m.  This led to an MRCP which showed gallstones in the gallbladder and a dilated CBD to 1.1 cm.  Her alk phos and bili were normal.  I discussed her case with my advanced endoscopy colleagues and she is currently scheduled for EUS with Dr. MRush Landmarkon May 5.  I had recommended endoscopy and colonoscopy for her at her last visit and she declined given she was not feeling well and wanted to postpone this.  She will have varices screening during her EUS with Dr. MRush Landmark  I again inquired her about performing a colonoscopy.  She denies any blood in her stools or bowel problems.  She states she is "not ready for it" and wishes to postpone this another few months if possible.  Prior workup: CT scan abdomen / pelvis 10/21/19 - IMPRESSION: 1. Findings most consistent with bilateral pyelonephritis. Correlation with urinalysis recommended. No drainable fluid collection or abscess. 2. Cirrhosis with small ascites and anasarca. 3. Sigmoid diverticulosis. No bowel obstruction. Normal appendix. 4. Aortic Atherosclerosis (ICD10-I70.0).  [place] 10/15/19: IMPRESSION: Layering  sludge/stones. No definite evidence of acute cholecystitis. Findings suggestive of cirrhosis Small amount of perihepatic ascites.  paracenesis - 590 WBC with 33% neutrophils, cytology negative, alb < 1.0, t prot < 3.0   B12 500 Folate 5.9 (low) now 22.4 HCV AB (-) Hep B surface antigen (-) Hep B core AB (-)  Iron sat 60% Ferritin 1379 Hemochromatosis genetic testing negative ANA positive 1:40 SMA positive to level of 26 IgG elevated to 2387 AMA negative  Liver biopsy done 02/20/20: FINAL MICROSCOPIC DIAGNOSIS:  A. LIVER, RIGHT LOBE, BIOPSY:  - Cirrhosis (stage 4 of 4) of uncertain etiology. See comment  - Mild interface hepatitis  The findings are consistent chronic hepatitis with cirrhosis of  uncertain etiology and compatible with patient's clinical history of  alcoholic hepatitis. Overlapping mild interface hepatitis with  scattered plasma cells is not diagnostic but appear suggestive of mildly  active autoimmune hepatitis. Clinical and serologic correlation is  Suggested.  Referred to Hepatology  MRI liver 01/02/20 - IMPRESSION: 1. Cirrhosis and portal venous hypertension, without evidence of hepatocellular carcinoma. 2. Trace abdominal ascites. 3.  Aortic Atherosclerosis (ICD10-I70.0).  RUQ [place] 07/21/20 - IMPRESSION: 1. Cirrhosis without a focal hepatic mass. 2. Cholelithiasis without gallbladder wall thickening and a positive sonographic Murphy sign. Findings are equivocal for cholecystitis. 3. Dilated common bile duct measuring up to 10.4 mm without an obstructing mass or choledocholithiasis. If there is further clinical concern, recommend evaluation with an MRI abdomen/MRCP.  MRCP 08/05/20 - IMPRESSION: 1. Extrahepatic biliary ductal dilatation, the central common bile duct measuring up to 1.1 cm in caliber. No obstructing calculus or other filling defect identified within the common bile duct to the ampulla. 2. Cholelithiasis, including a gallstone in  the gallbladder neck. No imaging evidence of acute cholecystitis at this time. 3. Recanalization of the umbilical vein and varices about the right lobe of the liver and mild splenomegaly, suggesting portal hypertension, however without overt morphologic stigmata of cirrhosis. The portal vein is patent. 4. Pancolonic diverticulosis.   Past Medical History:  Diagnosis Date  . Alcohol abuse   . Cirrhosis ([place])   . Hiatal hernia   . Pneumonia   . Pyelonephritis   . Sigmoid diverticulosis      Past Surgical History:  Procedure Laterality Date  . IR PARACENTESIS  10/16/2019  . IR TRANSCATHETER BX  02/20/2020  . IR US GUIDE VASC ACCESS RIGHT  02/20/2020  . IR VENOGRAM HEPATIC W HEMODYNAMIC EVALUATION  02/20/2020  . TUBAL LIGATION     Family History  Problem Relation Age of Onset  . Liver disease Mother   . Kidney disease Mother   . Heart disease Mother   . Cancer Neg Hx   . Anemia Neg Hx   . Leukemia Neg Hx   . Thrombocytopenia Neg Hx    Social History   Tobacco Use  . Smoking status: Current Every Day Smoker    Packs/day: 0.50    Years: 30.00    Pack years: 15.00    Types: Cigarettes  . Smokeless tobacco: Never Used  . Tobacco comment: 2-3 a day  Vaping Use  . Vaping Use: Never used  Substance Use Topics  . Alcohol use:  Not Currently    Comment: previously drank heavy about 3-6 drinks/day  . Drug use: Never   Current Outpatient Medications  Medication Sig Dispense Refill  . diclofenac Sodium (VOLTAREN) 1 % GEL Apply 4 g topically 4 (four) times daily. 100 g 1  . furosemide (LASIX) 40 MG tablet Take 1 tablet (40 mg total) by mouth daily. 90 tablet 1  . Multiple Vitamin (MULTIVITAMIN WITH MINERALS) TABS tablet Take 1 tablet by mouth daily.    Marland Kitchen thiamine 100 MG tablet Take 1 tablet (100 mg total) by mouth daily. 90 tablet 1   No current facility-administered medications for this visit.   No Known Allergies   Review of Systems: All systems reviewed and  negative except where noted in HPI.    MR 3D Recon At Scanner  Result Date: 08/06/2020 CLINICAL DATA:  Cirrhosis, right upper quadrant pain, cholelithiasis, dilated common bile duct EXAM: MRI ABDOMEN WITHOUT AND WITH CONTRAST (INCLUDING MRCP) TECHNIQUE: Multiplanar multisequence MR imaging of the abdomen was performed both before and after the administration of intravenous contrast. Heavily T2-weighted images of the biliary and pancreatic ducts were obtained, and three-dimensional MRCP images were rendered by post processing. CONTRAST:  54m GADAVIST GADOBUTROL 1 MMOL/ML IV SOLN COMPARISON:  Abdominal ultrasound, 07/20/2020 FINDINGS: Lower chest: No acute findings. Hepatobiliary: No mass or other parenchymal abnormality identified. Extrahepatic biliary ductal dilatation, the central common bile duct measuring up to 1.1 cm in caliber. Small gallstones, including a gallstone in the gallbladder neck (series 14, image 80). No gallbladder wall thickening or inflammatory findings. No obstructing calculus or other filling defect identified within the common bile duct to the ampulla. Pancreas: No mass, inflammatory changes, or other parenchymal abnormality identified. No pancreatic ductal dilatation. Spleen:  Mild splenomegaly, maximum coronal span 13.9 cm. Adrenals/Urinary Tract: No masses identified. No evidence of hydronephrosis. Stomach/Bowel: Pancolonic diverticulosis. Vascular/Lymphatic: No pathologically enlarged lymph nodes identified. No abdominal aortic aneurysm demonstrated. Recanalization of the umbilical vein and varices about the right lobe of the liver (series 28, image 59, 41). Other:  None. Musculoskeletal: No suspicious bone lesions identified. IMPRESSION: 1. Extrahepatic biliary ductal dilatation, the central common bile duct measuring up to 1.1 cm in caliber. No obstructing calculus or other filling defect identified within the common bile duct to the ampulla. 2. Cholelithiasis, including a gallstone  in the gallbladder neck. No imaging evidence of acute cholecystitis at this time. 3. Recanalization of the umbilical vein and varices about the right lobe of the liver and mild splenomegaly, suggesting portal hypertension, however without overt morphologic stigmata of cirrhosis. The portal vein is patent. 4. Pancolonic diverticulosis. Electronically Signed   By: AEddie CandleM.D.   On: 08/06/2020 09:23   MR ABDOMEN MRCP W WO CONTAST  Result Date: 08/06/2020 CLINICAL DATA:  Cirrhosis, right upper quadrant pain, cholelithiasis, dilated common bile duct EXAM: MRI ABDOMEN WITHOUT AND WITH CONTRAST (INCLUDING MRCP) TECHNIQUE: Multiplanar multisequence MR imaging of the abdomen was performed both before and after the administration of intravenous contrast. Heavily T2-weighted images of the biliary and pancreatic ducts were obtained, and three-dimensional MRCP images were rendered by post processing. CONTRAST:  955mGADAVIST GADOBUTROL 1 MMOL/ML IV SOLN COMPARISON:  Abdominal ultrasound, 07/20/2020 FINDINGS: Lower chest: No acute findings. Hepatobiliary: No mass or other parenchymal abnormality identified. Extrahepatic biliary ductal dilatation, the central common bile duct measuring up to 1.1 cm in caliber. Small gallstones, including a gallstone in the gallbladder neck (series 14, image 80). No gallbladder wall thickening or inflammatory findings. No obstructing  calculus or other filling defect identified within the common bile duct to the ampulla. Pancreas: No mass, inflammatory changes, or other parenchymal abnormality identified. No pancreatic ductal dilatation. Spleen:  Mild splenomegaly, maximum coronal span 13.9 cm. Adrenals/Urinary Tract: No masses identified. No evidence of hydronephrosis. Stomach/Bowel: Pancolonic diverticulosis. Vascular/Lymphatic: No pathologically enlarged lymph nodes identified. No abdominal aortic aneurysm demonstrated. Recanalization of the umbilical vein and varices about the right lobe  of the liver (series 28, image 59, 41). Other:  None. Musculoskeletal: No suspicious bone lesions identified. IMPRESSION: 1. Extrahepatic biliary ductal dilatation, the central common bile duct measuring up to 1.1 cm in caliber. No obstructing calculus or other filling defect identified within the common bile duct to the ampulla. 2. Cholelithiasis, including a gallstone in the gallbladder neck. No imaging evidence of acute cholecystitis at this time. 3. Recanalization of the umbilical vein and varices about the right lobe of the liver and mild splenomegaly, suggesting portal hypertension, however without overt morphologic stigmata of cirrhosis. The portal vein is patent. 4. Pancolonic diverticulosis. Electronically Signed   By: Eddie Candle M.D.   On: 08/06/2020 09:23   US Abdomen Limited RUQ (LIVER/GB)  Result Date: 07/21/2020 CLINICAL DATA:  Cirrhosis, increased alpha fetoprotein EXAM: ULTRASOUND ABDOMEN LIMITED RIGHT UPPER QUADRANT COMPARISON:  CT abdomen 10/21/2019 FINDINGS: Gallbladder: Cholelithiasis without gallbladder wall thickening. Positive sonographic Murphy sign. Common bile duct: Diameter: 10.4 mm Liver: Coarse hepatic echotexture with a slightly nodular contour. No focal hepatic mass. Portal vein is patent on color Doppler imaging with normal direction of blood flow towards the liver. Other: None. IMPRESSION: 1. Cirrhosis without a focal hepatic mass. 2. Cholelithiasis without gallbladder wall thickening and a positive sonographic Murphy sign. Findings are equivocal for cholecystitis. 3. Dilated common bile duct measuring up to 10.4 mm without an obstructing mass or choledocholithiasis. If there is further clinical concern, recommend evaluation with an MRI abdomen/MRCP. Electronically Signed   By: Kathreen Devoid   On: 07/21/2020 10:22   Lab Results  Component Value Date   WBC 7.9 07/26/2020   HGB 13.2 07/26/2020   HCT 38.7 07/26/2020   MCV 94.5 07/26/2020   PLT 149.0 (L) 07/26/2020    Lab  Results  Component Value Date   CREATININE 0.81 06/02/2020   BUN 13 06/02/2020   NA 139 06/02/2020   K 4.5 06/02/2020   CL 106 06/02/2020   CO2 20 06/02/2020    Lab Results  Component Value Date   ALT 18 07/26/2020   AST 39 (H) 07/26/2020   ALKPHOS 87 07/26/2020   BILITOT 1.7 (H) 07/26/2020    Lab Results  Component Value Date   INR 1.5 (H) 07/26/2020   INR 1.6 (H) 02/20/2020   INR 1.6 (H) 12/17/2019      Physical Exam: BP 126/68   Pulse 70   Ht 5' 7"  (1.702 m)   Wt 210 lb (95.3 kg)   BMI 32.89 kg/m  Constitutional: Pleasant,well-developed, female in no acute distress. Abdominal: Soft, nondistended, nontender. There are no masses palpable. No ascites Extremities: trace edema Lymphadenopathy: No cervical adenopathy noted. Neurological: Alert and oriented to person place and time. Skin: Skin is warm and dry. No rashes noted. Psychiatric: Normal mood and affect. Behavior is normal.   ASSESSMENT AND PLAN: 65 year old female here for reassessment of following  Alcoholic cirrhosis Abnormal imaging of the bile duct  History as outlined above.  Suspected she has cirrhosis largely driven by her alcohol use.  Labs raised question of autoimmune hepatitis and she  underwent a transjugular liver biopsy which suggested cirrhosis likely due to alcohol but raised question of interface hepatitis from AIH.  She has been seen by hepatology who did not recommend immunosuppressive therapy for this, although possible she has a component of burned-out autoimmune hepatitis.  She is clinically doing much better since stopping alcohol.  Jaundice has resolved, ascites resolved.  I will check her IgG and ferritin level now that she has stopped drinking for several months and see if that has improved, will also check AFP level today.  As above her [place] screening with ultrasound showed a dilated CBD which led to an MRCP.  She is currently scheduled for an EUS with Dr. Rush Landmark in 2 weeks to  evaluate the dilated CBD.  She can have her varices screening during this exam as well, as she has declined EGD up till this point.  We will await recommendations from Dr. Rush Landmark following his findings.  The patient is due for routine colon cancer screening and I have recommended colonoscopy.  She declined this at her last visit and she states today she is willing to do this at some point but she is not yet ready for scheduling, may be amenable to schedule this at her next follow-up visit.  She understands my recommendation to do this as soon as she is able to.  Otherwise we will plan on repeat [place] screening in 6 months and I will follow her in the office in 6 months.  I reviewed hepatology notes today, they saw her in December and wanted to see her back in 3 months, encouraged patient to call them for routine follow-up.  She is to continue low-sodium diet.  Plan: - continued complete alcohol abstinence - low Na diet - labs today - AFP, IgG, ferritin - EGD / EUS with Dr. Rush Landmark on 5/5 - follow up with Hepatology, scheduled for May - recommended colonoscopy for screening purposes, patient agreeable but not yet ready to schedule, encouraged her to do this at her earliest convenience - repeat [facility] screening in October - follow up in the office in 6 months or sooner with issues  [place] Cellar, MD [facility]

## 2020-08-18 NOTE — Patient Instructions (Addendum)
If you are age 65 or older, your body mass index should be between 23-30. Your Body mass index is 32.89 kg/m. If this is out of the aforementioned range listed, please consider follow up with your Primary Care Provider.  If you are age 55 or younger, your body mass index should be between 19-25. Your Body mass index is 32.89 kg/m. If this is out of the aformentioned range listed, please consider follow up with your Primary Care Provider.   Please go to the lab in the basement of our building to have lab work done as you leave today. Hit "B" for basement when you get on the elevator.  When the doors open the lab is on your left.  We will call you with the results. Thank you.  Due to recent changes in healthcare laws, you may see the results of your imaging and laboratory studies on MyChart before your provider has had a chance to review them.  We understand that in some cases there may be results that are confusing or concerning to you. Not all laboratory results come back in the same time frame and the provider may be waiting for multiple results in order to interpret others.  Please give [place] 48 hours in order for your provider to thoroughly review all the results before contacting the office for clarification of your results.   You will be due for an Office Visit and RUQ ultrasound in October.  We will remind you when it gets closer to the time to schedule.  We will request your records from [facility] office.   Thank you for entrusting me with your care and for choosing [facility], Dr. Ileene Patrick

## 2020-08-19 LAB — IGG: IgG (Immunoglobin G), Serum: 2078 mg/dL — ABNORMAL HIGH (ref 600–1540)

## 2020-08-19 LAB — AFP TUMOR MARKER: AFP-Tumor Marker: 13.2 ng/mL — ABNORMAL HIGH

## 2020-09-01 NOTE — Progress Notes (Signed)
Attempted to obtain medical history via telephone, unable to reach at this time. I left a voicemail to return pre surgical testing department's phone call.  

## 2020-09-02 ENCOUNTER — Telehealth: Payer: Self-pay | Admitting: Gastroenterology

## 2020-09-02 ENCOUNTER — Other Ambulatory Visit (HOSPITAL_COMMUNITY): Payer: Medicare Other

## 2020-09-02 NOTE — Telephone Encounter (Signed)
Left message on machine to call back  

## 2020-09-02 NOTE — Telephone Encounter (Signed)
Inbound call from patient. She is requesting to cancel the EUS 5/9 and possible reschedule for next month sometime. States she is trying to get other insurance to cover and she is not ready.

## 2020-09-02 NOTE — Telephone Encounter (Signed)
Please just keep me up-to-date about when she would like to be rescheduled.  Thanks. GM

## 2020-09-03 NOTE — Telephone Encounter (Signed)
Left message on machine to call back  

## 2020-09-03 NOTE — Telephone Encounter (Signed)
I have tried to reach the pt on several occasions to confirm that she wishes to reschedule the appointment.

## 2020-09-06 ENCOUNTER — Encounter (HOSPITAL_COMMUNITY): Admission: RE | Payer: Self-pay | Source: Home / Self Care

## 2020-09-06 ENCOUNTER — Ambulatory Visit (HOSPITAL_COMMUNITY): Admission: RE | Admit: 2020-09-06 | Payer: Medicare Other | Source: Home / Self Care | Admitting: Gastroenterology

## 2020-09-06 SURGERY — UPPER ENDOSCOPIC ULTRASOUND (EUS) RADIAL
Anesthesia: Monitor Anesthesia Care

## 2020-09-06 NOTE — Telephone Encounter (Signed)
Unable to reach the pt to reschedule procedure.  Messages left for her to return call to set up when she wishes to proceed

## 2020-09-06 NOTE — Telephone Encounter (Signed)
Hopefully she will want to reschedule at some point in time in the future. Thanks. GM

## 2021-01-06 ENCOUNTER — Telehealth: Payer: Self-pay

## 2021-01-06 DIAGNOSIS — K7030 Alcoholic cirrhosis of liver without ascites: Secondary | ICD-10-CM

## 2021-01-06 NOTE — Telephone Encounter (Signed)
-----   Message from Cooper Render, CMA sent at 08/18/2020 11:11 AM EDT ----- Regarding: due for OV and RUQ U/S in October Patient needs RUQ U/S and OV in October. HCC screening (Cirrhosis)

## 2021-01-06 NOTE — Telephone Encounter (Signed)
Scheduled patient for RUQ ultrasound at [facility] on Wed, 9-28 at 9:30 am. She will need to check in at the [facility] by the big statue at 9:15am. NPO after midnight.  Scheduled patient for Office Visit with Dr. Adela Lank on Friday, 10-28 at 9:20 am. Called patient but her phone just rings and then goes to busy.  Will send a message to MyChart and mail a letter with appointments info.

## 2021-01-26 ENCOUNTER — Ambulatory Visit: Admission: RE | Admit: 2021-01-26 | Payer: Medicare Other | Source: Ambulatory Visit

## 2021-02-25 ENCOUNTER — Ambulatory Visit: Payer: Medicare Other | Admitting: Gastroenterology

## 2021-03-02 ENCOUNTER — Other Ambulatory Visit: Payer: Self-pay

## 2021-03-02 DIAGNOSIS — D696 Thrombocytopenia, unspecified: Secondary | ICD-10-CM

## 2021-03-03 ENCOUNTER — Other Ambulatory Visit: Payer: Self-pay

## 2021-03-09 NOTE — Progress Notes (Signed)
[facility]  [phone]) [phone] Fax:(336) [phone]     ID: Shirley Mays DOB: 08/16/1955  MR#: [id number]  HRC#:[id number]  Patient Care Team: Hoy Register, MD as PCP - General (Family Medicine) Lowella Dell, MD OTHER MD:  CHIEF COMPLAINT: Macrocytic anemia   INTERVAL HISTORY: Shirley Mays was scheduled here today for follow up of her anemia and thrombocytopenia.  However she did not show   REVIEW OF SYSTEMS: Shirley Mays    COVID 19 VACCINATION STATUS:    HISTORY OF CURRENT ILLNESS: From the original intake note:  Shirley Mays is a pleasant 65 year old woman who is here today for consultation and evaluation for her macrocytic anemia, and previous leukocytosis, along with mild thromboctyopenia.  Shirley Mays tells me that she does not go undergo routine medical care, and she hasn't seen a health care provider in years.  She says that in mid June she developed lower extremity swelling and some pain and went to the hospital.  She was hospitalized from October 14, 2019 through October 23, 2019 for community acquired pneumonia.  She received IV antibiotics.  She also was found to have cirrhosis and ascites, and a paracentesis was performed that removed 1.2 liters from her abdomen.  She was discharged on Spironolactone and Lasix and her ascites has improved.  Shirley Mays has a h/o ETOH abuse.  She was drinking about 3-5 ETOH beverages per day, typically wine or liquor drinks.  She notes that she has quit drinking since her hospitalization.    Her labs were abnormal during her hospitalization and the trend is noted below.  Most importantly, her WBC is now normal.     Results for Shirley Mays, Shirley Mays (MRN [id number]) as of 12/04/2019 13:49  Ref. Range 10/14/2019 23:57 10/15/2019 05:20 10/16/2019 05:00 10/17/2019 09:25 10/18/2019 03:35 10/19/2019 06:35 10/20/2019 03:58 10/21/2019 08:24 10/22/2019 03:40 10/23/2019 03:15 11/19/2019 14:04 12/04/2019 13:09  WBC Latest Ref Range: 4.0 - 10.5 K/uL 17.8 (H) 16.5 (H) 18.2 (H) 17.8  (H) 17.6 (H) 18.0 (H) 20.5 (H) 23.1 (H) 19.1 (H) 19.6 (H) 8.0 7.6  Results for Shirley Mays, Shirley Mays (MRN [id number]) as of 12/04/2019 13:49  Ref. Range 10/14/2019 23:57 10/15/2019 05:20 10/16/2019 05:00 10/17/2019 09:25 10/18/2019 03:35 10/19/2019 06:35 10/20/2019 03:58 10/20/2019 20:55 10/21/2019 08:24 10/22/2019 03:40 10/23/2019 03:15 11/19/2019 14:04 12/04/2019 13:09  Hemoglobin Latest Ref Range: 12.0 - 15.0 g/dL 88.8 (L) 91.6 (L) 94.5 (L) 11.0 (L) 10.1 (L) 9.8 (L) 9.6 (L) 10.0 (L) 10.7 (L) 9.6 (L) 10.2 (L) 9.7 (L) 9.6 (L)  Results for Shirley Mays, Shirley Mays (MRN [id number]) as of 12/04/2019 13:49  Ref. Range 10/14/2019 23:57 10/15/2019 05:20 10/16/2019 05:00 10/17/2019 09:25 10/18/2019 03:35 10/19/2019 06:35 10/20/2019 03:58 10/21/2019 08:24 10/22/2019 03:40 10/23/2019 03:15 11/19/2019 14:04 12/04/2019 13:09  MCV Latest Ref Range: 80.0 - 100.0 fL 108.7 (H) 108.2 (H) 105.6 (H) 103.4 (H) 104.0 (H) 104.6 (H) 103.9 (H) 106.3 (H) 106.2 (H) 106.6 (H) 104 (H) 105.2 (H)  Results for Shirley Mays, Shirley Mays (MRN [id number]) as of 12/04/2019 13:49  Ref. Range 10/14/2019 23:57 10/15/2019 05:20 10/16/2019 05:00 10/17/2019 09:25 10/18/2019 03:35 10/19/2019 06:35 10/20/2019 03:58 10/21/2019 08:24 10/22/2019 03:40 10/23/2019 03:15 11/19/2019 14:04 12/04/2019 13:09  Platelets Latest Ref Range: 150 - 400 K/uL 123 (L) 112 (L) 117 (L) 142 (L) 143 (L) 243 267 350 320 339 154 134 (L)   The patient's subsequent history is as detailed below.   PAST MEDICAL HISTORY: Past Medical History:  Diagnosis Date   Alcohol abuse    Cirrhosis (HCC)    Hiatal hernia  Pneumonia    Pyelonephritis    Sigmoid diverticulosis     PAST SURGICAL HISTORY: Past Surgical History:  Procedure Laterality Date   IR PARACENTESIS  10/16/2019   IR TRANSCATHETER BX  02/20/2020   IR US GUIDE VASC ACCESS RIGHT  02/20/2020   IR VENOGRAM HEPATIC W HEMODYNAMIC EVALUATION  02/20/2020   TUBAL LIGATION      FAMILY HISTORY Family History  Problem Relation Age of Onset   Liver disease Mother    Kidney disease Mother     Heart disease Mother    Cancer Neg Hx    Anemia Neg Hx    Leukemia Neg Hx    Thrombocytopenia Neg Hx     GYNECOLOGIC HISTORY:  Menarche: 65 years old Age at first live birth: 65 years old GX P 3 LMP post menopausal HRT never  Hysterectomy? no Salpingo-oophorectomy?no   SOCIAL HISTORY:  Shirley Mays is single and lives with her duaghter Shirley Mays who works running the office with Dr. Jillyn Hidden.  She has another son who is a marine and her third son is in [place], [place] working with his father in Social worker.  She is retired.  She smokes 3 cigarettes per day, but tells me at her heaviest she smoked 1/2 ppd for 30 years.  She denies any drug use, however previously used excess amounts of ETOH.  She has since quit.      ADVANCED DIRECTIVES: Not in place   HEALTH MAINTENANCE: Social History   Tobacco Use   Smoking status: Every Day    Packs/day: 0.50    Years: 30.00    Pack years: 15.00    Types: Cigarettes   Smokeless tobacco: Never   Tobacco comments:    2-3 a day  Vaping Use   Vaping Use: Never used  Substance Use Topics   Alcohol use: Not Currently    Comment: previously drank heavy about 3-6 drinks/day   Drug use: Never     Colonoscopy: never  PAP: 10 years ago  Mammo: 10 years ago  Bone density: never   No Known Allergies  Current Outpatient Medications  Medication Sig Dispense Refill   diclofenac Sodium (VOLTAREN) 1 % GEL Apply 4 g topically 4 (four) times daily. 100 g 1   furosemide (LASIX) 40 MG tablet Take 1 tablet (40 mg total) by mouth daily. 90 tablet 1   Multiple Vitamin (MULTIVITAMIN WITH MINERALS) TABS tablet Take 1 tablet by mouth daily.     thiamine 100 MG tablet Take 1 tablet (100 mg total) by mouth daily. 90 tablet 1   No current facility-administered medications for this visit.    OBJECTIVE:   There were no vitals filed for this visit.    There is no height or weight on file to calculate BMI.   Wt Readings from Last 3 Encounters:  08/18/20 210 lb (95.3 kg)   06/02/20 212 lb (96.2 kg)  03/17/20 193 lb 12.8 oz (87.9 kg)     ECOG FS:1 - Symptomatic but completely ambulatory  inframammary fold NEURO:  Nonfocal. Well oriented.  Appropriate affect.}   LAB RESULTS:    Chemistry    No visits with results within 3 Day(s) from this visit.  Latest known visit with results is:  Appointment on 08/18/2020  Component Date Value Ref Range Status   AFP-Tumor Marker 08/18/2020 13.2 (A)  ng/mL Final   Comment: Reference Range:   <6.1 The use of AFP as a tumor marker in pregnant females is not recommended. Marland Kitchen Marland Kitchen  This test was performed using the Beckman Coulter chemiluminescent method. Values obtained from different assay methods cannot be used interchangeably. AFP levels, regardless of value, should not be interpreted as absolute evidence of the presence or absence of disease. .    INR 08/18/2020 1.5 (A)  0.8 - 1.0 ratio Final   Prothrombin Time 08/18/2020 16.3 (A)  9.6 - 13.1 sec Final   IgG (Immunoglobin G), Serum 08/18/2020 2,078 (A)  600 - 1,540 mg/dL Final   Ferritin 21/22/4825 108.1  10.0 - 291.0 ng/mL Final    STUDIES: No results found.   ASSESSMENT: 65 y.o. woman with no recent medical care, being evaluated today for anemia.  1. Macrocytic Anemia  (a) Likely secondary to ETOH use and cirrhosis  (b) Liver biopsy confirms cirrhosis, possible autoimmune hepatitis, referred to [facility]  (c) declined colonoscopy and EGD in 03/2020 with Dr. Adela Lank  2. Mild thrombocytopenia  (a) likely secondary to ETOH use and cirrhosis   PLAN: Reyonna did not show for her appointment 03/10/2021.  A follow-up letter has been sent.   Shirley Hue. Magrinat, MD 03/10/21 6:53 PM Medical Oncology and Hematology [facility] [address] [place], [place] [address] Tel. [phone]    Fax. [phone]   I, Mickie Bail, am acting as scribe for Dr. Valentino Hue. Mays.  I, Ruthann Cancer MD, have reviewed the above  documentation for accuracy and completeness, and I agree with the above.    *Total Encounter Time as defined by the Centers for Medicare and Medicaid Services includes, in addition to the face-to-face time of a patient visit (documented in the note above) non-face-to-face time: obtaining and reviewing outside history, ordering and reviewing medications, tests or procedures, care coordination (communications with other health care professionals or caregivers) and documentation in the medical record.

## 2021-03-10 ENCOUNTER — Encounter: Payer: Self-pay | Admitting: Oncology

## 2021-03-10 ENCOUNTER — Ambulatory Visit (HOSPITAL_BASED_OUTPATIENT_CLINIC_OR_DEPARTMENT_OTHER): Payer: Medicare Other | Admitting: Oncology

## 2021-03-10 DIAGNOSIS — D696 Thrombocytopenia, unspecified: Secondary | ICD-10-CM

## 2021-10-24 ENCOUNTER — Telehealth: Payer: Self-pay

## 2021-11-08 ENCOUNTER — Ambulatory Visit: Payer: Medicare Other | Admitting: Family Medicine

## 2021-12-01 ENCOUNTER — Encounter: Payer: Self-pay | Admitting: Physician Assistant

## 2021-12-01 ENCOUNTER — Ambulatory Visit: Payer: Medicare Other | Attending: Family Medicine | Admitting: Physician Assistant

## 2021-12-01 VITALS — BP 148/78 | HR 86 | Ht 67.0 in | Wt 248.2 lb

## 2021-12-01 DIAGNOSIS — M7501 Adhesive capsulitis of right shoulder: Secondary | ICD-10-CM | POA: Diagnosis not present

## 2021-12-01 DIAGNOSIS — F1011 Alcohol abuse, in remission: Secondary | ICD-10-CM | POA: Insufficient documentation

## 2021-12-01 DIAGNOSIS — M7502 Adhesive capsulitis of left shoulder: Secondary | ICD-10-CM | POA: Insufficient documentation

## 2021-12-01 DIAGNOSIS — R7989 Other specified abnormal findings of blood chemistry: Secondary | ICD-10-CM

## 2021-12-01 DIAGNOSIS — M549 Dorsalgia, unspecified: Secondary | ICD-10-CM

## 2021-12-01 DIAGNOSIS — M546 Pain in thoracic spine: Secondary | ICD-10-CM | POA: Insufficient documentation

## 2021-12-01 DIAGNOSIS — R030 Elevated blood-pressure reading, without diagnosis of hypertension: Secondary | ICD-10-CM | POA: Diagnosis not present

## 2021-12-01 DIAGNOSIS — M79601 Pain in right arm: Secondary | ICD-10-CM | POA: Diagnosis present

## 2021-12-01 MED ORDER — NAPROXEN 500 MG PO TABS: 500.0000 mg | ORAL_TABLET | Freq: Two times a day (BID) | ORAL | 1 refills | Status: DC

## 2021-12-01 MED ORDER — CYCLOBENZAPRINE HCL 5 MG PO TABS: ORAL_TABLET | ORAL | 1 refills | Status: DC

## 2021-12-01 NOTE — Progress Notes (Signed)
Patient ID: Shirley Mays, female   DOB: January 08, 1956, 66 y.o.   MRN: [id number]   Shirley Mays, is a 66 y.o. female  HKV:[id number]  FIE:[id number]  DOB - Feb 22, 1956  Chief Complaint  Patient presents with   Arm Pain       Subjective:   Shirley Mays is a 66 y.o. female here today for labs and R arm and upper back/neck pain.  She relates this to being in the hospital 2 years ago, then falling in the parking lot a year and a half ago and caught herself with her R arm.  Felt jarred and painful since.  She has been taking tylenol for the pain.  She has never sought care for this until now.  Has not been seen by her PCP in over 1 year.    Says she no longer drinks alcohol since hospitalization 2 years ago.  No abdominal pain/N/V/D.    No problems updated.  ALLERGIES: No Known Allergies  PAST MEDICAL HISTORY: Past Medical History:  Diagnosis Date   Alcohol abuse    Cirrhosis (HCC)    Hiatal hernia    Pneumonia    Pyelonephritis    Sigmoid diverticulosis     MEDICATIONS AT HOME: Prior to Admission medications   Medication Sig Start Date End Date Taking? Authorizing Provider  cyclobenzaprine (FLEXERIL) 5 MG tablet 1/2 to 1 tablet up to 3 times daily for muscle spasm 12/01/21  Yes Georgian Co M, PA-C  diclofenac Sodium (VOLTAREN) 1 % GEL Apply 4 g topically 4 (four) times daily. 06/02/20  Yes Hoy Register, MD  furosemide (LASIX) 40 MG tablet Take 1 tablet (40 mg total) by mouth daily. 06/02/20  Yes Hoy Register, MD  Multiple Vitamin (MULTIVITAMIN WITH MINERALS) TABS tablet Take 1 tablet by mouth daily. 10/24/19  Yes Almon Hercules, MD  naproxen (NAPROSYN) 500 MG tablet Take 1 tablet (500 mg total) by mouth 2 (two) times daily with a meal. Prn pain 12/01/21  Yes Evens Meno M, PA-C  thiamine 100 MG tablet Take 1 tablet (100 mg total) by mouth daily. 10/24/19  Yes Gonfa, Boyce Medici, MD    ROS: Neg HEENT Neg resp Neg cardiac Neg GI Neg GU Neg psych Neg neuro  Objective:   Vitals:    12/01/21 1034 12/01/21 1051  BP: (!) 150/84 (!) 148/78  Pulse: 86   SpO2: 96%   Weight: 248 lb 3.2 oz (112.6 kg)   Height: 5\' 7"  (1.702 m)    Exam General appearance : Awake, alert, not in any distress. Speech Clear. Not toxic looking.  Poor historian HEENT: Atraumatic and Normocephalic Neck: Supple, no JVD. No cervical lymphadenopathy.  Chest: Good air entry bilaterally, CTAB.  No rales/rhonchi/wheezing CVS: S1 S2 regular, no murmurs.  Favors L arm.  ROM and strength = B.  BUE DTR= intact B.  There is spasm that is tender to touch in R upper back Extremities: B/L Lower Ext shows no edema, both legs are warm to touch Neurology: Awake alert, and oriented X 3, CN II-XII intact, Non focal Skin: No Rash  Data Review No results found for: "HGBA1C"  Assessment & Plan   1. Right arm pain H/o adhesive capsulitis - naproxen (NAPROSYN) 500 MG tablet; Take 1 tablet (500 mg total) by mouth 2 (two) times daily with a meal. Prn pain  Dispense: 60 tablet; Refill: 1 - cyclobenzaprine (FLEXERIL) 5 MG tablet; 1/2 to 1 tablet up to 3 times daily for muscle spasm  Dispense: 30 tablet;  Refill: 1  2. Upper back pain on right side - naproxen (NAPROSYN) 500 MG tablet; Take 1 tablet (500 mg total) by mouth 2 (two) times daily with a meal. Prn pain  Dispense: 60 tablet; Refill: 1 - cyclobenzaprine (FLEXERIL) 5 MG tablet; 1/2 to 1 tablet up to 3 times daily for muscle spasm  Dispense: 30 tablet; Refill: 1  3. Adhesive capsulitis of both shoulders by history - Ambulatory referral to Orthopedic Surgery - naproxen (NAPROSYN) 500 MG tablet; Take 1 tablet (500 mg total) by mouth 2 (two) times daily with a meal. Prn pain  Dispense: 60 tablet; Refill: 1 - cyclobenzaprine (FLEXERIL) 5 MG tablet; 1/2 to 1 tablet up to 3 times daily for muscle spasm  Dispense: 30 tablet; Refill: 1  4. Elevated blood pressure reading Check blood pressure 2-3 times weekly and record and bring to next visit We have discussed target  BP range and blood pressure goal. I have advised patient to check BP regularly and to call us back or report to clinic if the numbers are consistently higher than 130/85. We discussed the importance of compliance with medical therapy and DASH diet recommended, consequences of uncontrolled hypertension discussed.  - Comprehensive metabolic panel  5. Elevated LFTs Stop tylenol - Comprehensive metabolic panel - CBC with Differential/Platelet  6. H/O alcohol abuse In remission per patient - Comprehensive metabolic panel - CBC with Differential/Platelet    Return in about 3 weeks (around 12/22/2021) for [facility] for BP;  3 months for PCP.  The patient was given clear instructions to go to ER or return to medical center if symptoms don't improve, worsen or new problems develop. The patient verbalized understanding. The patient was told to call to get lab results if they haven't heard anything in the next week.      Georgian Co, PA-C [facility] [place], [place] [phone]   12/01/2021, 10:56 AM

## 2021-12-01 NOTE — Patient Instructions (Signed)
Check blood pressure 2-3 times weekly and record and bring to next visit

## 2021-12-02 LAB — COMPREHENSIVE METABOLIC PANEL
ALT: 11 IU/L (ref 0–32)
AST: 24 IU/L (ref 0–40)
Albumin/Globulin Ratio: 1.1 — ABNORMAL LOW (ref 1.2–2.2)
Albumin: 4.5 g/dL (ref 3.9–4.9)
Alkaline Phosphatase: 113 IU/L (ref 44–121)
BUN/Creatinine Ratio: 14 (ref 12–28)
BUN: 11 mg/dL (ref 8–27)
Bilirubin Total: 0.6 mg/dL (ref 0.0–1.2)
CO2: 17 mmol/L — ABNORMAL LOW (ref 20–29)
Calcium: 9.6 mg/dL (ref 8.7–10.3)
Chloride: 106 mmol/L (ref 96–106)
Creatinine, Ser: 0.8 mg/dL (ref 0.57–1.00)
Globulin, Total: 4 g/dL (ref 1.5–4.5)
Glucose: 85 mg/dL (ref 70–99)
Potassium: 4.1 mmol/L (ref 3.5–5.2)
Sodium: 141 mmol/L (ref 134–144)
Total Protein: 8.5 g/dL (ref 6.0–8.5)
eGFR: 81 mL/min/{1.73_m2} (ref >59)

## 2021-12-02 LAB — CBC WITH DIFFERENTIAL/PLATELET
Basophils Absolute: 0.1 10*3/uL (ref 0.0–0.2)
Basos: 1 %
EOS (ABSOLUTE): 0.2 10*3/uL (ref 0.0–0.4)
Eos: 2 %
Hematocrit: 42.5 % (ref 34.0–46.6)
Hemoglobin: 14.8 g/dL (ref 11.1–15.9)
Immature Grans (Abs): 0 10*3/uL (ref 0.0–0.1)
Immature Granulocytes: 0 %
Lymphocytes Absolute: 3.3 10*3/uL — ABNORMAL HIGH (ref 0.7–3.1)
Lymphs: 32 %
MCH: 30 pg (ref 26.6–33.0)
MCHC: 34.8 g/dL (ref 31.5–35.7)
MCV: 86 fL (ref 79–97)
Monocytes Absolute: 0.6 10*3/uL (ref 0.1–0.9)
Monocytes: 6 %
Neutrophils Absolute: 6.2 10*3/uL (ref 1.4–7.0)
Neutrophils: 59 %
Platelets: 214 10*3/uL (ref 150–450)
RBC: 4.94 x10E6/uL (ref 3.77–5.28)
RDW: 12.2 % (ref 11.7–15.4)
WBC: 10.3 10*3/uL (ref 3.4–10.8)

## 2021-12-05 ENCOUNTER — Ambulatory Visit: Payer: Self-pay

## 2021-12-05 ENCOUNTER — Encounter: Payer: Self-pay | Admitting: Orthopedic Surgery

## 2021-12-05 ENCOUNTER — Ambulatory Visit (INDEPENDENT_AMBULATORY_CARE_PROVIDER_SITE_OTHER): Payer: Medicare Other

## 2021-12-05 ENCOUNTER — Ambulatory Visit (INDEPENDENT_AMBULATORY_CARE_PROVIDER_SITE_OTHER): Payer: Medicare Other | Admitting: Orthopedic Surgery

## 2021-12-05 DIAGNOSIS — M25511 Pain in right shoulder: Secondary | ICD-10-CM

## 2021-12-05 DIAGNOSIS — M25512 Pain in left shoulder: Secondary | ICD-10-CM

## 2021-12-05 NOTE — Progress Notes (Signed)
Office Visit Note   Patient: Shirley Mays           Date of Birth: 07-29-1955           MRN: [id number] Visit Date: 12/05/2021 Requested by: Anders Simmonds, PA-C [address] [place] [address] [place],  [place] [address] PCP: Hoy Register, MD  Subjective: Chief Complaint  Patient presents with   Right Shoulder - Pain   Left Shoulder - Pain    HPI: Patient presents for evaluation of bilateral shoulder pain right worse than left.  She has had pain since a fall last year when she was visiting the hospital.  Patient states that the pain wakes her from sleep at night.  She is right-hand dominant.  Describes decreased range of motion.  Initially after the accident she really could hardly lift her arm up at all.  It has improved some.  She does report a lot of catching and popping in that right shoulder region.  Takes Tylenol and ibuprofen as well as some muscle relaxer.  She has been doing home exercises for the shoulder for the last 2 months without significant improvement in symptoms.  Did have a lot of swelling in that right shoulder region after the fall.  Does describe weakness in the shoulder with overhead lifting.  Denies much in the way of radicular symptoms.  No prior surgery or injury to either shoulder.              ROS: All systems reviewed are negative as they relate to the chief complaint within the history of present illness.  Patient denies  fevers or chills.   Assessment & Plan: Visit Diagnoses:  1. Bilateral shoulder pain, unspecified chronicity   2. Right shoulder pain, unspecified chronicity     Plan: Impression bilateral shoulder pain right worse than left with probable rotator cuff pathology on the right-hand side based on exam.  Plan for MRI arthrogram right shoulder to evaluate rotator cuff tear following fall.  May involve just the supraspinatus tendon based on the amount of recovery she has had from the injury.  Follow-up after that study.  Follow-Up Instructions: No  follow-ups on file.   Orders:  Orders Placed This Encounter  Procedures   XR Shoulder Right   XR Shoulder Left   MR SHOULDER RIGHT W CONTRAST   Arthrogram   No orders of the defined types were placed in this encounter.     Procedures: No procedures performed   Clinical Data: No additional findings.  Objective: Vital Signs: There were no vitals taken for this visit.  Physical Exam:   Constitutional: Patient appears well-developed HEENT:  Head: Normocephalic Eyes:EOM are normal Neck: Normal range of motion Cardiovascular: Normal rate Pulmonary/chest: Effort normal Neurologic: Patient is alert Skin: Skin is warm Psychiatric: Patient has normal mood and affect   Ortho Exam: Ortho exam demonstrates passive range of motion bilateral 5/110/170.  She does have a little weakness to infraspinatus testing on the right compared to the left.  No discrete AC joint tenderness is present.  No masses lymphadenopathy or skin changes around the shoulder region.  Has little bit more crepitus with internal and external rotation of the right shoulder 90 degrees abduction compared to the left.  No Popeye deformity present.  Does have tenderness over the bicipital groove  Specialty Comments:  No specialty comments available.  Imaging: XR Shoulder Right  Result Date: 12/05/2021 AP axillary radiographs right shoulder reviewed.  No glenohumeral joint or AC joint  arthritis is present.  Acromial distance maintained.  Shoulder is located.  No acute fracture.  Increased interstitial markings within the lungs with no pneumothorax or pleural effusion  XR Shoulder Left  Result Date: 12/05/2021 AP and axillary radiographs left shoulder reviewed.  Acromiohumeral distance is maintained.  No acute fracture.  Visualized lung fields have no pneumothorax but increased interstitial markings with no effusion.  Shoulder is located.    PMFS History: Patient Active Problem List   Diagnosis Date Noted   CAP  (community acquired pneumonia) 10/15/2019   Sepsis (HCC) 10/15/2019   Liver disease 10/15/2019   Anemia 10/15/2019   Thrombocytopenia (HCC) 10/15/2019   Past Medical History:  Diagnosis Date   Alcohol abuse    Cirrhosis (HCC)    Hiatal hernia    Pneumonia    Pyelonephritis    Sigmoid diverticulosis     Family History  Problem Relation Age of Onset   Liver disease Mother    Kidney disease Mother    Heart disease Mother    Cancer Neg Hx    Anemia Neg Hx    Leukemia Neg Hx    Thrombocytopenia Neg Hx     Past Surgical History:  Procedure Laterality Date   IR PARACENTESIS  10/16/2019   IR TRANSCATHETER BX  02/20/2020   IR US GUIDE VASC ACCESS RIGHT  02/20/2020   IR VENOGRAM HEPATIC W HEMODYNAMIC EVALUATION  02/20/2020   TUBAL LIGATION     Social History   Occupational History   Not on file  Tobacco Use   Smoking status: Every Day    Packs/day: 0.50    Years: 30.00    Total pack years: 15.00    Types: Cigarettes   Smokeless tobacco: Never   Tobacco comments:    2-3 a day  Vaping Use   Vaping Use: Never used  Substance and Sexual Activity   Alcohol use: Not Currently    Comment: previously drank heavy about 3-6 drinks/day   Drug use: Never   Sexual activity: Not on file

## 2021-12-21 ENCOUNTER — Ambulatory Visit
Admission: RE | Admit: 2021-12-21 | Discharge: 2021-12-21 | Disposition: A | Discharge status: Home / Self Care | Payer: Medicare Other | Source: Ambulatory Visit | Attending: Orthopedic Surgery | Admitting: Orthopedic Surgery

## 2021-12-21 DIAGNOSIS — M25511 Pain in right shoulder: Secondary | ICD-10-CM

## 2021-12-21 MED ORDER — IOPAMIDOL (ISOVUE-M 200) INJECTION 41%
18.0000 mL | Freq: Once | INTRAMUSCULAR | Status: AC
  Administered 2021-12-21: 18 mL via INTRA_ARTICULAR

## 2021-12-23 ENCOUNTER — Other Ambulatory Visit: Payer: Self-pay | Admitting: Family Medicine

## 2021-12-23 DIAGNOSIS — K7031 Alcoholic cirrhosis of liver with ascites: Secondary | ICD-10-CM

## 2021-12-23 NOTE — Telephone Encounter (Signed)
Requested Prescriptions  Pending Prescriptions Disp Refills  . furosemide (LASIX) 40 MG tablet [Pharmacy Med Name: FUROSEMIDE 40 MG TABLET] 90 tablet 1    Sig: TAKE ONE TABLET BY MOUTH DAILY     Cardiovascular:  Diuretics - Loop Failed - 12/23/2021  8:52 AM      Failed - Mg Level in normal range and within 180 days    Magnesium  Date Value Ref Range Status  10/23/2019 1.9 1.7 - 2.4 mg/dL Final    Comment:    Performed at [facility], [address] [place], [place] [address]         Failed - Last BP in normal range    BP Readings from Last 1 Encounters:  12/01/21 (!) 148/78         Passed - K in normal range and within 180 days    Potassium  Date Value Ref Range Status  12/01/2021 4.1 3.5 - 5.2 mmol/L Final         Passed - Ca in normal range and within 180 days    Calcium  Date Value Ref Range Status  12/01/2021 9.6 8.7 - 10.3 mg/dL Final         Passed - Na in normal range and within 180 days    Sodium  Date Value Ref Range Status  12/01/2021 141 134 - 144 mmol/L Final         Passed - Cr in normal range and within 180 days    Creatinine  Date Value Ref Range Status  03/17/2020 0.84 0.44 - 1.00 mg/dL Final   Creatinine, Ser  Date Value Ref Range Status  12/01/2021 0.80 0.57 - 1.00 mg/dL Final         Passed - Cl in normal range and within 180 days    Chloride  Date Value Ref Range Status  12/01/2021 106 96 - 106 mmol/L Final         Passed - Valid encounter within last 6 months    Recent Outpatient Visits          3 weeks ago Right arm pain   [place] [facility] [place], [place], [place]   1 year ago Alcoholic cirrhosis of liver with ascites [facility])   [facility] Hoy Register, MD   1 year ago Alcoholic cirrhosis of liver with ascites [facility])   [place] Hoy Register, MD   2 years ago Liver disease   [facility] [place], [place],  [place]      Future Appointments            In 5 days August Saucer, Corrie Mckusick, MD [facility]   In 2 weeks Drucilla Chalet, RPH-CPP [facility]   In 2 months Hoy Register, MD [facility]

## 2021-12-28 ENCOUNTER — Encounter: Payer: Self-pay | Admitting: Orthopedic Surgery

## 2021-12-28 ENCOUNTER — Ambulatory Visit (INDEPENDENT_AMBULATORY_CARE_PROVIDER_SITE_OTHER): Payer: Medicare Other | Admitting: Orthopedic Surgery

## 2021-12-28 DIAGNOSIS — M25511 Pain in right shoulder: Secondary | ICD-10-CM | POA: Diagnosis not present

## 2021-12-28 NOTE — Progress Notes (Signed)
Office Visit Note   Patient: Shirley Mays           Date of Birth: 10-Nov-1955           MRN: [id number] Visit Date: 12/28/2021 Requested by: Hoy Register, MD [address] [place],  [place] [address] PCP: Hoy Register, MD  Subjective: Chief Complaint  Patient presents with   Right Shoulder - Follow-up    HPI: Patient presents for follow-up of right shoulder pain.  Since she was last seen she had right shoulder MRI arthrogram which does show moderately retracted tear of the supraspinatus.  Symptoms ongoing for 2 years.  Takes Tylenol and muscle relaxer for her symptoms.              ROS: All systems reviewed are negative as they relate to the chief complaint within the history of present illness.  Patient denies  fevers or chills.   Assessment & Plan: Visit Diagnoses:  1. Right shoulder pain, unspecified chronicity     Plan: Impression is right shoulder pain with supraspinatus tear which is moderately retracted.  Currently the tear looks fixable.  She also try to avoid surgery if possible.  She is retired and does not place too much in terms of demands on her right shoulder.  We will try physical therapy to try to strengthen of the force couple supraspinatus and infraspinatus to help out with her pain symptoms.  Follow-up in 3 months for clinical recheck and decision for or against surgical intervention at that time  Follow-Up Instructions: Return in about 4 months (around 04/29/2022).   Orders:  Orders Placed This Encounter  Procedures   Ambulatory referral to Physical Therapy   No orders of the defined types were placed in this encounter.     Procedures: No procedures performed   Clinical Data: No additional findings.  Objective: Vital Signs: There were no vitals taken for this visit.  Physical Exam:   Constitutional: Patient appears well-developed HEENT:  Head: Normocephalic Eyes:EOM are normal Neck: Normal range of motion Cardiovascular:  Normal rate Pulmonary/chest: Effort normal Neurologic: Patient is alert Skin: Skin is warm Psychiatric: Patient has normal mood and affect   Ortho Exam: Ortho exam demonstrates full active and passive range of motion of the cervical spine.  Right shoulder range of motion passively and 65/100/170.  Does have some coarseness and grinding with internal/external rotation on the right at 90 degrees of abduction.  No discrete AC joint tenderness is present.  No other masses lymphadenopathy or skin changes noted in that shoulder girdle region.  Specialty Comments:  No specialty comments available.  Imaging: No results found.   PMFS History: Patient Active Problem List   Diagnosis Date Noted   CAP (community acquired pneumonia) 10/15/2019   Sepsis (HCC) 10/15/2019   Liver disease 10/15/2019   Anemia 10/15/2019   Thrombocytopenia (HCC) 10/15/2019   Past Medical History:  Diagnosis Date   Alcohol abuse    Cirrhosis (HCC)    Hiatal hernia    Pneumonia    Pyelonephritis    Sigmoid diverticulosis     Family History  Problem Relation Age of Onset   Liver disease Mother    Kidney disease Mother    Heart disease Mother    Cancer Neg Hx    Anemia Neg Hx    Leukemia Neg Hx    Thrombocytopenia Neg Hx     Past Surgical History:  Procedure Laterality Date   IR PARACENTESIS  10/16/2019  IR TRANSCATHETER BX  02/20/2020   IR US GUIDE VASC ACCESS RIGHT  02/20/2020   IR VENOGRAM HEPATIC W HEMODYNAMIC EVALUATION  02/20/2020   TUBAL LIGATION     Social History   Occupational History   Not on file  Tobacco Use   Smoking status: Every Day    Packs/day: 0.50    Years: 30.00    Total pack years: 15.00    Types: Cigarettes   Smokeless tobacco: Never   Tobacco comments:    2-3 a day  Vaping Use   Vaping Use: Never used  Substance and Sexual Activity   Alcohol use: Not Currently    Comment: previously drank heavy about 3-6 drinks/day   Drug use: Never   Sexual activity: Not on  file

## 2022-01-06 ENCOUNTER — Ambulatory Visit: Payer: Medicare Other | Attending: Internal Medicine | Admitting: Pharmacist

## 2022-01-06 VITALS — BP 143/81 | HR 94

## 2022-01-06 DIAGNOSIS — I10 Essential (primary) hypertension: Secondary | ICD-10-CM

## 2022-01-06 DIAGNOSIS — Z7189 Other specified counseling: Secondary | ICD-10-CM | POA: Diagnosis not present

## 2022-01-06 MED ORDER — LISINOPRIL 10 MG PO TABS: 10.0000 mg | ORAL_TABLET | Freq: Every day | ORAL | 3 refills | Status: DC

## 2022-01-06 MED ORDER — BLOOD PRESSURE MON/AUTO/WRIST DEVI: 0 refills | Status: DC

## 2022-01-06 NOTE — Progress Notes (Signed)
   S:     No chief complaint on file.  Shirley Mays is a 66 y.o. female who presents for hypertension evaluation, education, and management.  PMH is significant for CAP, Alcoholic Cirrhosis, thrombocytopenia.  Patient was referred and last seen by Primary Care Provider, Georgian Co, on 12/01/2021.   At last visit, BP was 148/78, BP medication was not started until confirmed with home readings.   Today, patient arrives in good spirits and presents without assistance. Denies dizziness, headache, blurred vision, swelling.  Patient reports taking a 5 mg red pill in the past but stopped it about 20 years ago.   Patient reports hypertension was diagnosed ~20 years ago.   Family/Social history:  Fhx: kidney disease, heart disease, liver disease Tobacco: currently smoker (~6 cigarettes daily) Alcohol: stopped drinking 2 years ago  Reported home BP readings: 140-150s/80s   Patient reported dietary habits:  -Tries to be adherent with salt restriction.  Patient-reported exercise habits:  -Patient reports 5-10 minutes at home exercising every other day.  O:  Vitals:   01/06/22 1137  BP: (!) 143/81  Pulse: 94   Last 3 Office BP readings: BP Readings from Last 3 Encounters:  01/06/22 (!) 143/81  12/01/21 (!) 148/78  08/18/20 126/68   BMET    Component Value Date/Time   NA 141 12/01/2021 1100   K 4.1 12/01/2021 1100   CL 106 12/01/2021 1100   CO2 17 (L) 12/01/2021 1100   GLUCOSE 85 12/01/2021 1100   GLUCOSE 86 03/17/2020 1015   BUN 11 12/01/2021 1100   CREATININE 0.80 12/01/2021 1100   CREATININE 0.84 03/17/2020 1015   CALCIUM 9.6 12/01/2021 1100   GFRNONAA 76 06/02/2020 1208   GFRNONAA >60 03/17/2020 1015   GFRAA 88 06/02/2020 1208   Renal function: CrCl cannot be calculated (Patient's most recent lab result is older than the maximum 21 days allowed.).  Clinical ASCVD: No  The ASCVD Risk score (Arnett DK, et al., 2019) failed to calculate for the following reasons:    Cannot find a previous HDL lab   Cannot find a previous total cholesterol lab  A/P: Hypertension longstanding currently uncontrolled. BP goal < 130/80 mmHg.  -Started Lisinopril 10 mg daily.  -Patient educated on purpose, proper use, and potential adverse effects of Lisinopril.  -F/u labs - CMP14+GFR -Counseled on lifestyle modifications for blood pressure control including reduced dietary sodium, increased exercise, adequate sleep. -Encouraged patient to check BP at home and bring log of readings to next visit. Counseled on proper use of home BP cuff.    Results reviewed and written information provided.    Written patient instructions provided. Patient verbalized understanding of treatment plan.  Total time in face to face counseling 30 minutes.    Follow-up:  -Follow up with pharmacist in 4-6 weeks.   Patient seen with: Lajean Saver, PharmD Candidate [employer]  [id number]    Pharmacist:  Butch Penny, PharmD, [place], CPP Clinical Pharmacist [facility] [phone]

## 2022-01-09 ENCOUNTER — Encounter: Payer: Self-pay | Admitting: Physical Therapy

## 2022-01-09 ENCOUNTER — Ambulatory Visit: Payer: Medicare Other | Attending: Family Medicine | Admitting: Physical Therapy

## 2022-01-09 ENCOUNTER — Other Ambulatory Visit: Payer: Self-pay

## 2022-01-09 DIAGNOSIS — G8929 Other chronic pain: Secondary | ICD-10-CM | POA: Diagnosis present

## 2022-01-09 DIAGNOSIS — M25511 Pain in right shoulder: Secondary | ICD-10-CM | POA: Diagnosis present

## 2022-01-09 DIAGNOSIS — M25611 Stiffness of right shoulder, not elsewhere classified: Secondary | ICD-10-CM | POA: Insufficient documentation

## 2022-01-09 DIAGNOSIS — M6281 Muscle weakness (generalized): Secondary | ICD-10-CM | POA: Insufficient documentation

## 2022-01-09 NOTE — Therapy (Signed)
OUTPATIENT PHYSICAL THERAPY SHOULDER EVALUATION   Patient Name: Shirley Mays MRN: [id number] DOB:11/09/1955, 66 y.o., female Today's Date: 01/09/2022   PT End of Session - 01/09/22 1111     Visit Number 1    Number of Visits 13    Date for PT Re-Evaluation 02/20/22    Authorization Type Medicare part A and B    Progress Note Due on Visit 10    PT Start Time 1115    PT Stop Time 1155    PT Time Calculation (min) 40 min    Activity Tolerance Patient tolerated treatment well    Behavior During Therapy WFL for tasks assessed/performed             Past Medical History:  Diagnosis Date   Alcohol abuse    Cirrhosis (HCC)    Hiatal hernia    Pneumonia    Pyelonephritis    Sigmoid diverticulosis    Past Surgical History:  Procedure Laterality Date   IR PARACENTESIS  10/16/2019   IR TRANSCATHETER BX  02/20/2020   IR US GUIDE VASC ACCESS RIGHT  02/20/2020   IR VENOGRAM HEPATIC W HEMODYNAMIC EVALUATION  02/20/2020   TUBAL LIGATION     Patient Active Problem List   Diagnosis Date Noted   CAP (community acquired pneumonia) 10/15/2019   Sepsis (HCC) 10/15/2019   Liver disease 10/15/2019   Anemia 10/15/2019   Thrombocytopenia (HCC) 10/15/2019    PCP: Hoy Register MD  REFERRING PROVIDER: Cammy Copa, MD  REFERRING DIAG: M25.511 (ICD-10-CM) - Right shoulder pain, unspecified chronicity  THERAPY DIAG:  Chronic right shoulder pain  Stiffness of right shoulder, not elsewhere classified  Muscle weakness (generalized)  Rationale for Evaluation and Treatment Rehabilitation  ONSET DATE: Chronic, approx 2 years  SUBJECTIVE:                                                                                                                                                                                      SUBJECTIVE STATEMENT: Pt states she has been in pain since she left hospital a couple of years ago, pt states she feels she got weaker during admission. Pt states  she fell onto R elbow after admission, no pain initially but developed pain afterwards. Symptoms have been ongoing since. Pt states she has been doing some stretches at home with some relief. Pt states she enjoys working with arts and craft, painting, sewing. Used to play tennis and handball prior to shoulder pain per pt report. States she has been able to perform most daily activities with increased time and with modifications to avoid use of R arm.   PERTINENT HISTORY: none  PAIN:  Are you having pain: Yes, 6/10 Location: R shoulder How would you describe your pain? Shooting pain Best: 6/10 Worst: 10/10 Aggravating factors: lifting arm, sleeping, getting out of bed, picking up objects from floor, self care and grooming activities such as putting on deodorant Easing factors: stretching, tylenol  PRECAUTIONS: None  WEIGHT BEARING RESTRICTIONS No  FALLS:  Has patient fallen in last 6 months? No  LIVING ENVIRONMENT: Lives with: lives with their family Lives in: House/apartment Stairs: none Has following equipment at home: Environmental consultant - 2 wheeled  OCCUPATION: Not working currently  PLOF: Independent  PATIENT GOALS pt states she would like to avoid surgery if possible, "feel energy again" and do more  OBJECTIVE:   DIAGNOSTIC FINDINGS:  MRI 12/23/21 IMPRESSION: Severe appearing rotator cuff tendinopathy with a 1.3 cm from front to back full-thickness tear of the anterior and far lateral supraspinatus. Retraction is 1-2 cm. No focal atrophy.   Mild fatty atrophy of all imaged muscles is likely due to disuse.   Mild to moderate acromioclavicular osteoarthritis.  PATIENT SURVEYS:  FOTO 45  COGNITION:  Overall cognitive status: Within functional limits for tasks assessed     SENSATION: WFL  POSTURE: Forward head, rounded shoulders, L upper trap elevation    UPPER EXTREMITY ROM:  Active/passive ROM Right eval Left eval  Shoulder flexion 95p! But able to get to 120  with effort 160  Shoulder abduction 95 p! 140  Elbow flexion [facility] WFL  Elbow extension [facility] WFL   (Blank rows = not tested) Comments: significant UT compensations with active elevation BUE    UPPER EXTREMITY MMT:  MMT Right eval Left eval  Shoulder flexion 3+ 4-  Shoulder internal rotation 4 4  Shoulder external rotation 4 4  Elbow flexion 5 5  Elbow extension 5 5   (Blank rows = not tested)    JOINT MOBILITY TESTING:  Deferred given time constraints  PALPATION:  TTP R UT, supraspinatus muscle belly, infraspinatus, latissimus dorsi and subscap   TODAY'S TREATMENT:  [facility] Adult PT Treatment:                                                DATE: 01/09/2022  Therapeutic Exercise: Standing shoulder flexion walk back stretch, BUE supported, x10, cues for appropriate ROM and pacing Shoulder flexion isometrics, x10, RUE only, cues for form and 2 sec hold, cues to limit compensations at tricep and with body weight Scapular retraction x10, seated, cues for form and posture Manual Therapy: NA Neuromuscular re-ed: NA Therapeutic Activity: NA Modalities: NA Self Care: NA    PATIENT EDUCATION: Education details: Pt education on PT impairments, prognosis, and POC. Rationale for interventions, safe/appropriate HEP performance Person educated: Patient Education method: Explanation, Demonstration, Tactile cues, Verbal cues, and Handouts Education comprehension: verbalized understanding, returned demonstration, verbal cues required, tactile cues required, and needs further education    HOME EXERCISE PROGRAM: Access Code: [email or web address] URL: [email or web address] Date: 01/09/2022 Prepared by: Fransisco Hertz  Exercises - Seated Scapular Retraction  - 1 x daily - 7 x weekly - 3 sets - 10 reps - Standing 'L' Stretch at Counter  - 1 x daily - 7 x weekly - 3 sets - 10 reps - Isometric Shoulder Flexion at Wall  - 1 x daily - 7 x weekly - 3 sets - 10  reps  ASSESSMENT:  CLINICAL IMPRESSION: Patient  is a 66 y.o. woman who was seen today for physical therapy evaluation and treatment for R shoulder pain ongoing for approximately 2 years. Pt reports limitations in daily activities such as housework and self care due to her pain. During today's evaluation pt demonstrates deficits in glenohumeral mobility and shoulder girdle strength. Pt tolerates HEP well with cues for form, denies increase in pain. Recommend skilled PT in order to address aforementioned deficits and improve tolerance to functional activities. Pt departs today's session in no acute distress, all voiced questions/concerns addressed appropriately from PT perspective.     OBJECTIVE IMPAIRMENTS decreased activity tolerance, decreased endurance, decreased mobility, decreased ROM, decreased strength, postural dysfunction, and pain.   ACTIVITY LIMITATIONS carrying, lifting, bending, sleeping, bathing, dressing, reach over head, and hygiene/grooming  PARTICIPATION LIMITATIONS: meal prep, cleaning, laundry, driving, and shopping  PERSONAL FACTORS Time since onset of injury/illness/exacerbation are also affecting patient's functional outcome.   REHAB POTENTIAL: Good  CLINICAL DECISION MAKING: Stable/uncomplicated  EVALUATION COMPLEXITY: Low   GOALS: Goals reviewed with patient? Yes  SHORT TERM GOALS: Target date: 01/30/2022  Pt will demonstrate appropriate understanding and performance of initially prescribed HEP in order to facilitate improved independence with management of symptoms.  Baseline: HEP provided on eval Goal status: INITIAL   2. Pt will score greater than or equal to 53 on FOTO in order to demonstrate improved perception of function due to symptoms.  Baseline: 45  Goal status: INITIAL  LONG TERM GOALS: Target date: 02/20/2022  Pt will score 62 on FOTO in order to demonstrate improved perception of function d/t neck/shoulder pain. Baseline: 45 Goal status:  INITIAL  2.  Pt will demonstrate at least 140 degrees of active shoulder elevation in order to demonstrate improved tolerance to functional movement patterns such as reaching overhead for dishes. Baseline: 120 flexion, 95 deg abduction on R Goal status: INITIAL  3.  Pt will demonstrate at least 4+/5 R shoulder flex for improved symmetry of UE strength and improved tolerance to functional movements.  Baseline:  Goal status: INITIAL  4. Pt will report ability to perform typical household activity such as lifting 10# with less than 2 point increase in shoulder pain on NPS in order to demonstrate improved functional tolerance to daily activities such as laundry.    Baseline: NT due to pain  Goal status: INITIAL     PLAN: PT FREQUENCY: 2x/week  PT DURATION: 6 weeks  PLANNED INTERVENTIONS: Therapeutic exercises, Therapeutic activity, Neuromuscular re-education, Balance training, Gait training, Patient/Family education, Self Care, Joint mobilization, Joint manipulation, Aquatic Therapy, Dry Needling, Electrical stimulation, Spinal manipulation, Spinal mobilization, Cryotherapy, Moist heat, Manual therapy, and Re-evaluation  PLAN FOR NEXT SESSION: Progress ROM/strengthening exercises as able/appropriate, review HEP. Work on more specific rotator cuff activation/strengthening. Assess functional IR/ER measurements.    Ashley Murrain PT, DPT 01/09/2022 12:18 PM

## 2022-01-16 NOTE — Therapy (Signed)
OUTPATIENT PHYSICAL THERAPY TREATMENT NOTE   Patient Name: Shirley Mays MRN: [id number] DOB:1955/08/19, 66 y.o., female Today's Date: 01/17/2022  PCP: Hoy Register MD   REFERRING PROVIDER: Cammy Copa, MD  END OF SESSION:   PT End of Session - 01/17/22 0933     Visit Number 2    Number of Visits 13    Date for PT Re-Evaluation 02/20/22    Authorization Type Medicare part A and B    Progress Note Due on Visit 10    PT Start Time 0933    PT Stop Time 1013    PT Time Calculation (min) 40 min    Activity Tolerance Patient tolerated treatment well    Behavior During Therapy WFL for tasks assessed/performed             Past Medical History:  Diagnosis Date   Alcohol abuse    Cirrhosis (HCC)    Hiatal hernia    Pneumonia    Pyelonephritis    Sigmoid diverticulosis    Past Surgical History:  Procedure Laterality Date   IR PARACENTESIS  10/16/2019   IR TRANSCATHETER BX  02/20/2020   IR US GUIDE VASC ACCESS RIGHT  02/20/2020   IR VENOGRAM HEPATIC W HEMODYNAMIC EVALUATION  02/20/2020   TUBAL LIGATION     Patient Active Problem List   Diagnosis Date Noted   CAP (community acquired pneumonia) 10/15/2019   Sepsis (HCC) 10/15/2019   Liver disease 10/15/2019   Anemia 10/15/2019   Thrombocytopenia (HCC) 10/15/2019    REFERRING DIAG: REFERRING DIAG: M25.511 (ICD-10-CM) - Right shoulder pain, unspecified chronicity  THERAPY DIAG:  Chronic right shoulder pain  Stiffness of right shoulder, not elsewhere classified  Muscle weakness (generalized)  Rationale for Evaluation and Treatment Rehabilitation  PERTINENT HISTORY: none  PAIN: per initial evaluation Are you having pain: Yes, 6/10 Location: R shoulder How would you describe your pain? Shooting pain Best: 6/10 Worst: 10/10 Aggravating factors: lifting arm, sleeping, getting out of bed, picking up objects from floor, self care and grooming activities such as putting on deodorant Easing factors:  stretching, tylenol  PRECAUTIONS: none  SUBJECTIVE: Pt arrives with no pain (0/10 NPS), states she is feeling good this morning. Pleasant and agreeable for PT. Pt reports soreness for a couple of hours after initial evaluation but improved with time and tylenol. Pt notes she already feels an improvement in her pain. Pt reports good compliance with HEP, exercises getting easier.     OBJECTIVE: (objective measures completed at initial evaluation unless otherwise dated)   DIAGNOSTIC FINDINGS:  MRI 12/23/21 IMPRESSION: Severe appearing rotator cuff tendinopathy with a 1.3 cm from front to back full-thickness tear of the anterior and far lateral supraspinatus. Retraction is 1-2 cm. No focal atrophy.   Mild fatty atrophy of all imaged muscles is likely due to disuse.   Mild to moderate acromioclavicular osteoarthritis.   PATIENT SURVEYS:  FOTO 45   COGNITION:           Overall cognitive status: Within functional limits for tasks assessed                                  SENSATION: WFL   POSTURE: Forward head, rounded shoulders, L upper trap elevation       UPPER EXTREMITY ROM:   Active/passive ROM Right eval Left eval  Shoulder flexion 95p! But able to get to 120 with effort 160  Shoulder  abduction 95 p! 140  Elbow flexion [facility] WFL  Elbow extension WFL WFL   (Blank rows = not tested) Comments: significant UT compensations with active elevation BUE       UPPER EXTREMITY MMT:   MMT Right eval Left eval  Shoulder flexion 3+ 4-  Shoulder internal rotation 4 4  Shoulder external rotation 4 4  Elbow flexion 5 5  Elbow extension 5 5   (Blank rows = not tested)      JOINT MOBILITY TESTING:  Deferred given time constraints   PALPATION:  TTP R UT, supraspinatus muscle belly, infraspinatus, latissimus dorsi and subscap             TODAY'S TREATMENT:   [facility] Adult PT Treatment:                                                DATE: 01/17/22 Therapeutic Exercise: Wall  ball flexion B UE 2x10 Standing physioball scaption on table 2x10 Scapular retraction 3x10   Manual Therapy: NA Neuromuscular re-ed: Shoulder flexion isos 2x10 for improved RC stability/activation, tactile/verbal cues as needed   Swiss ball flexion perturbation, 3x30sec closed chain RC stability  Shoulder ER YTB walk outs 2x8 for improved RC activation Shoulder extensions YTB 3x10, for improved lat activation, tactile/verbal cues as needed    Therapeutic Activity: NA Modalities: NA Self Care: NA  [facility] Adult PT Treatment:                                                DATE: 01/09/2022  Therapeutic Exercise: Standing shoulder flexion walk back stretch, BUE supported, x10, cues for appropriate ROM and pacing Shoulder flexion isometrics, x10, RUE only, cues for form and 2 sec hold, cues to limit compensations at tricep and with body weight Scapular retraction x10, seated, cues for form and posture    PATIENT EDUCATION: Education details: education on impairments as they relate to function, rationale for interventions throughout, discussed typical course of post exercise muscle soreness Person educated: Patient Education method: Explanation, Demonstration, Tactile cues, Verbal cues Education comprehension: verbalized understanding, returned demonstration, verbal cues required, tactile cues required, and needs further education      HOME EXERCISE PROGRAM: Access Code: [email or web address] URL: [email or web address] Date: 01/09/2022 Prepared by: Enis Slipper   Exercises - Seated Scapular Retraction  - 1 x daily - 7 x weekly - 3 sets - 10 reps - Standing 'L' Stretch at Counter  - 1 x daily - 7 x weekly - 3 sets - 10 reps - Isometric Shoulder Flexion at Wall  - 1 x daily - 7 x weekly - 3 sets - 10 reps   ASSESSMENT:   CLINICAL IMPRESSION: Patient is a 66 y.o. woman who was seen today for physical therapy evaluation and treatment for R shoulder pain ongoing for approximately 2  years. Session initiated with [facility] mobility exercises with good tolerance.  Able to progress for increased complexity and volume for GH stability and periscapular activation/endurance exercises with no overt pain, primary report of muscular fatigue/burning. Pt remains limited by reduced [place] stability and RC strength, reduced GH mobility.   Tolerates session well with no adverse events, denies any pain on departure but does note that she may feel  sore tomorrow. Recommend skilled PT to address aforementioned deficits to maximize functional independence and tolerance. Pt departs today's session in no acute distress, all voiced questions/concerns addressed appropriately from PT perspective.       OBJECTIVE IMPAIRMENTS decreased activity tolerance, decreased endurance, decreased mobility, decreased ROM, decreased strength, postural dysfunction, and pain.    ACTIVITY LIMITATIONS carrying, lifting, bending, sleeping, bathing, dressing, reach over head, and hygiene/grooming   PARTICIPATION LIMITATIONS: meal prep, cleaning, laundry, driving, and shopping   PERSONAL FACTORS Time since onset of injury/illness/exacerbation are also affecting patient's functional outcome.    REHAB POTENTIAL: Good   CLINICAL DECISION MAKING: Stable/uncomplicated   EVALUATION COMPLEXITY: Low     GOALS: Goals reviewed with patient? Yes   SHORT TERM GOALS: Target date: 01/30/2022   Pt will demonstrate appropriate understanding and performance of initially prescribed HEP in order to facilitate improved independence with management of symptoms.  Baseline: HEP provided on eval Goal status: INITIAL    2. Pt will score greater than or equal to 53 on FOTO in order to demonstrate improved perception of function due to symptoms.            Baseline: 45            Goal status: INITIAL   LONG TERM GOALS: Target date: 02/20/2022   Pt will score 62 on FOTO in order to demonstrate improved perception of function d/t neck/shoulder  pain. Baseline: 45 Goal status: INITIAL   2.  Pt will demonstrate at least 140 degrees of active shoulder elevation in order to demonstrate improved tolerance to functional movement patterns such as reaching overhead for dishes. Baseline: 120 flexion, 95 deg abduction on R Goal status: INITIAL   3.  Pt will demonstrate at least 4+/5 R shoulder flex for improved symmetry of UE strength and improved tolerance to functional movements.  Baseline:  Goal status: INITIAL   4. Pt will report ability to perform typical household activity such as lifting 10# with less than 2 point increase in shoulder pain on NPS in order to demonstrate improved functional tolerance to daily activities such as laundry.              Baseline: NT due to pain            Goal status: INITIAL        PLAN: PT FREQUENCY: 2x/week   PT DURATION: 6 weeks   PLANNED INTERVENTIONS: Therapeutic exercises, Therapeutic activity, Neuromuscular re-education, Balance training, Gait training, Patient/Family education, Self Care, Joint mobilization, Joint manipulation, Aquatic Therapy, Dry Needling, Electrical stimulation, Spinal manipulation, Spinal mobilization, Cryotherapy, Moist heat, Manual therapy, and Re-evaluation   PLAN FOR NEXT SESSION: Review/modify HEP as appropriate. Progress strength/stability exercises as able/appropriate.     Ashley Murrain PT, DPT 01/17/2022 10:24 AM

## 2022-01-17 ENCOUNTER — Ambulatory Visit: Payer: Medicare Other | Admitting: Physical Therapy

## 2022-01-17 ENCOUNTER — Encounter: Payer: Self-pay | Admitting: Physical Therapy

## 2022-01-17 DIAGNOSIS — G8929 Other chronic pain: Secondary | ICD-10-CM

## 2022-01-17 DIAGNOSIS — M25511 Pain in right shoulder: Secondary | ICD-10-CM | POA: Diagnosis not present

## 2022-01-17 DIAGNOSIS — M25611 Stiffness of right shoulder, not elsewhere classified: Secondary | ICD-10-CM

## 2022-01-17 DIAGNOSIS — M6281 Muscle weakness (generalized): Secondary | ICD-10-CM

## 2022-01-18 NOTE — Therapy (Signed)
OUTPATIENT PHYSICAL THERAPY TREATMENT NOTE   Patient Name: Shirley Mays MRN: [id number] DOB:May 25, 1955, 66 y.o., female Today's Date: 01/19/2022  PCP: Hoy Register MD   REFERRING PROVIDER: Cammy Copa, MD  END OF SESSION:   PT End of Session - 01/19/22 1033     Visit Number 3    Number of Visits 13    Date for PT Re-Evaluation 02/20/22    Authorization Type Medicare part A and B    Progress Note Due on Visit 10    PT Start Time 1034    PT Stop Time 1059    PT Time Calculation (min) 25 min    Activity Tolerance Patient tolerated treatment well    Behavior During Therapy WFL for tasks assessed/performed              Past Medical History:  Diagnosis Date   Alcohol abuse    Cirrhosis (HCC)    Hiatal hernia    Pneumonia    Pyelonephritis    Sigmoid diverticulosis    Past Surgical History:  Procedure Laterality Date   IR PARACENTESIS  10/16/2019   IR TRANSCATHETER BX  02/20/2020   IR US GUIDE VASC ACCESS RIGHT  02/20/2020   IR VENOGRAM HEPATIC W HEMODYNAMIC EVALUATION  02/20/2020   TUBAL LIGATION     Patient Active Problem List   Diagnosis Date Noted   CAP (community acquired pneumonia) 10/15/2019   Sepsis (HCC) 10/15/2019   Liver disease 10/15/2019   Anemia 10/15/2019   Thrombocytopenia (HCC) 10/15/2019    REFERRING DIAG: REFERRING DIAG: M25.511 (ICD-10-CM) - Right shoulder pain, unspecified chronicity  THERAPY DIAG:  Chronic right shoulder pain  Stiffness of right shoulder, not elsewhere classified  Muscle weakness (generalized)  Rationale for Evaluation and Treatment Rehabilitation  PERTINENT HISTORY: none  PRECAUTIONS: none  SUBJECTIVE: Pt arrives with 3/10 NPS in R neck/shoulder, pt states she had a little soreness after last session but is better today. Good compliance with HEP reported. No other new updates.    PAIN:  Are you having pain: Yes, 3/10 Location: R shoulder How would you describe your pain? Shooting pain Best:  6/10 Worst: 10/10 Aggravating factors: lifting arm, sleeping, getting out of bed, picking up objects from floor, self care and grooming activities such as putting on deodorant Easing factors: stretching, tylenol    OBJECTIVE: (objective measures completed at initial evaluation unless otherwise dated)   DIAGNOSTIC FINDINGS:  MRI 12/23/21 IMPRESSION: Severe appearing rotator cuff tendinopathy with a 1.3 cm from front to back full-thickness tear of the anterior and far lateral supraspinatus. Retraction is 1-2 cm. No focal atrophy.   Mild fatty atrophy of all imaged muscles is likely due to disuse.   Mild to moderate acromioclavicular osteoarthritis.   PATIENT SURVEYS:  FOTO 45   COGNITION:           Overall cognitive status: Within functional limits for tasks assessed                                  SENSATION: WFL   POSTURE: Forward head, rounded shoulders, L upper trap elevation       UPPER EXTREMITY ROM:   Active/passive ROM Right eval Left eval  Shoulder flexion 95p! But able to get to 120 with effort 160  Shoulder abduction 95 p! 140  Elbow flexion [facility] WFL  Elbow extension [facility] WFL   (Blank rows = not tested) Comments: significant UT  compensations with active elevation BUE       UPPER EXTREMITY MMT:   MMT Right eval Left eval  Shoulder flexion 3+ 4-  Shoulder internal rotation 4 4  Shoulder external rotation 4 4  Elbow flexion 5 5  Elbow extension 5 5   (Blank rows = not tested)      JOINT MOBILITY TESTING:  Deferred given time constraints   PALPATION:  TTP R UT, supraspinatus muscle belly, infraspinatus, latissimus dorsi and subscap             TODAY'S TREATMENT:  [facility] Adult PT Treatment:                                                DATE: 01/19/22 Therapeutic Exercise: Wall ball flexion x15 Rows YTB x15, cues for reduced UT activation Verbal review HEP  Neuromuscular re-ed: Swiss ball flexion perturbation at 90deg, 2x45sec closed chain RC  stability  Shoulder extensions YTB 2x12 for improved lat activation Shoulder scaption RTB around wrists 3x8 for improved RC activation into overhead movements, up to 90 deg only, tactile/verbal cues as needed   Shoulder ER YTB walk out x8 bilat for improved RC activation, cues for form    [facility] Adult PT Treatment:                                                DATE: 01/17/22 Therapeutic Exercise: Wall ball flexion B UE 2x10 Standing physioball scaption on table 2x10 Scapular retraction 3x10   Neuromuscular re-ed: Shoulder flexion isos 2x10 for improved RC stability/activation, tactile/verbal cues as needed   Swiss ball flexion perturbation, 3x30sec closed chain RC stability  Shoulder ER YTB walk outs 2x8 for improved RC activation Shoulder extensions YTB 3x10, for improved lat activation, tactile/verbal cues as needed       PATIENT EDUCATION: Education details: discussed HEP, progression with therapy, rationale for interventions throughout Person educated: Patient Education method: Explanation, Demonstration, Tactile cues, Verbal cues Education comprehension: verbalized understanding, returned demonstration, verbal cues required, tactile cues required, and needs further education      HOME EXERCISE PROGRAM: Access Code: [email or web address] URL: [email or web address] Date: 01/09/2022 Prepared by: Fransisco Hertz   Exercises - Seated Scapular Retraction  - 1 x daily - 7 x weekly - 3 sets - 10 reps - Standing 'L' Stretch at Counter  - 1 x daily - 7 x weekly - 3 sets - 10 reps - Isometric Shoulder Flexion at Wall  - 1 x daily - 7 x weekly - 3 sets - 10 reps   ASSESSMENT:   CLINICAL IMPRESSION: Patient is a 66 y.o. woman who was seen today for physical therapy evaluation and treatment for R shoulder pain ongoing for approximately 2 years.Today's session abbreviated due to late arrival, although pt still able to progress for reduced rest breaks to challenge activity tolerance and  endurance, as well as increased volume for RC activation exercises. Addition of exercises emphasizing RC activation towards overhead movements. Pt remains limited by reduced GH stability and RC strength, reduced GH mobility. Pt reports gradual improvement in symptoms throughout session, tolerates well overall, departs with 2/10 pain on NPS.  Recommend skilled PT to address aforementioned deficits to maximize functional independence and tolerance. Pt  departs today's session in no acute distress, all voiced questions/concerns addressed appropriately from PT perspective.       OBJECTIVE IMPAIRMENTS decreased activity tolerance, decreased endurance, decreased mobility, decreased ROM, decreased strength, postural dysfunction, and pain.    ACTIVITY LIMITATIONS carrying, lifting, bending, sleeping, bathing, dressing, reach over head, and hygiene/grooming   PARTICIPATION LIMITATIONS: meal prep, cleaning, laundry, driving, and shopping   PERSONAL FACTORS Time since onset of injury/illness/exacerbation are also affecting patient's functional outcome.    REHAB POTENTIAL: Good   CLINICAL DECISION MAKING: Stable/uncomplicated   EVALUATION COMPLEXITY: Low     GOALS: Goals reviewed with patient? Yes   SHORT TERM GOALS: Target date: 01/30/2022   Pt will demonstrate appropriate understanding and performance of initially prescribed HEP in order to facilitate improved independence with management of symptoms.  Baseline: HEP provided on eval Goal status: INITIAL    2. Pt will score greater than or equal to 53 on FOTO in order to demonstrate improved perception of function due to symptoms.            Baseline: 45            Goal status: INITIAL   LONG TERM GOALS: Target date: 02/20/2022   Pt will score 62 on FOTO in order to demonstrate improved perception of function d/t neck/shoulder pain. Baseline: 45 Goal status: INITIAL   2.  Pt will demonstrate at least 140 degrees of active shoulder elevation  in order to demonstrate improved tolerance to functional movement patterns such as reaching overhead for dishes. Baseline: 120 flexion, 95 deg abduction on R Goal status: INITIAL   3.  Pt will demonstrate at least 4+/5 R shoulder flex for improved symmetry of UE strength and improved tolerance to functional movements.  Baseline:  Goal status: INITIAL   4. Pt will report ability to perform typical household activity such as lifting 10# with less than 2 point increase in shoulder pain on NPS in order to demonstrate improved functional tolerance to daily activities such as laundry.              Baseline: NT due to pain            Goal status: INITIAL        PLAN: PT FREQUENCY: 2x/week   PT DURATION: 6 weeks   PLANNED INTERVENTIONS: Therapeutic exercises, Therapeutic activity, Neuromuscular re-education, Balance training, Gait training, Patient/Family education, Self Care, Joint mobilization, Joint manipulation, Aquatic Therapy, Dry Needling, Electrical stimulation, Spinal manipulation, Spinal mobilization, Cryotherapy, Moist heat, Manual therapy, and Re-evaluation   PLAN FOR NEXT SESSION: progress strength/stability exercises as able appropriate, emphasis on shoulder elevation up to 90 deg    Leeroy Cha PT, DPT 01/19/2022 11:01 AM

## 2022-01-19 ENCOUNTER — Ambulatory Visit: Payer: Medicare Other | Admitting: Physical Therapy

## 2022-01-19 ENCOUNTER — Encounter: Payer: Self-pay | Admitting: Physical Therapy

## 2022-01-19 DIAGNOSIS — M6281 Muscle weakness (generalized): Secondary | ICD-10-CM

## 2022-01-19 DIAGNOSIS — M25511 Pain in right shoulder: Secondary | ICD-10-CM | POA: Diagnosis not present

## 2022-01-19 DIAGNOSIS — M25611 Stiffness of right shoulder, not elsewhere classified: Secondary | ICD-10-CM

## 2022-01-19 DIAGNOSIS — G8929 Other chronic pain: Secondary | ICD-10-CM

## 2022-01-23 NOTE — Therapy (Signed)
OUTPATIENT PHYSICAL THERAPY TREATMENT NOTE   Patient Name: Shirley Mays MRN: [id number] DOB:1956-02-26, 66 y.o., female Today's Date: 01/24/2022  PCP: Charlott Rakes MD   REFERRING PROVIDER: Meredith Pel, MD  END OF SESSION:   PT End of Session - 01/24/22 0932     Visit Number 4    Number of Visits 13    Date for PT Re-Evaluation 02/20/22    Authorization Type Medicare part A and B    Progress Note Due on Visit 10    PT Start Time 0932    PT Stop Time 1011    PT Time Calculation (min) 39 min    Activity Tolerance Patient tolerated treatment well    Behavior During Therapy WFL for tasks assessed/performed               Past Medical History:  Diagnosis Date   Alcohol abuse    Cirrhosis ([place])    Hiatal hernia    Pneumonia    Pyelonephritis    Sigmoid diverticulosis    Past Surgical History:  Procedure Laterality Date   IR PARACENTESIS  10/16/2019   IR TRANSCATHETER BX  02/20/2020   IR US GUIDE VASC ACCESS RIGHT  02/20/2020   IR VENOGRAM HEPATIC W HEMODYNAMIC EVALUATION  02/20/2020   TUBAL LIGATION     Patient Active Problem List   Diagnosis Date Noted   CAP (community acquired pneumonia) 10/15/2019   Sepsis ([place]) 10/15/2019   Liver disease 10/15/2019   Anemia 10/15/2019   Thrombocytopenia ([place]) 10/15/2019    REFERRING DIAG: REFERRING DIAG: M25.511 (ICD-10-CM) - Right shoulder pain, unspecified chronicity  THERAPY DIAG:  Chronic right shoulder pain  Stiffness of right shoulder, not elsewhere classified  Muscle weakness (generalized)  Rationale for Evaluation and Treatment Rehabilitation  PERTINENT HISTORY: none  PRECAUTIONS: none  SUBJECTIVE:  Pt arrives with 2/10 pain on NPS, R shoulder. Pt reports mild soreness after last session but improved after about a day. Reports some increase in pain with weather increased activities at home but overall states she is doing well, pleased with progress thus far. No other new updates.     PAIN:  Are  you having pain: Yes, 2/10 Location: R shoulder How would you describe your pain? Shooting pain Best: 6/10 Worst: 10/10 Aggravating factors: lifting arm, sleeping, getting out of bed, picking up objects from floor, self care and grooming activities such as putting on deodorant Easing factors: stretching, tylenol    OBJECTIVE: (objective measures completed at initial evaluation unless otherwise dated)   DIAGNOSTIC FINDINGS:  MRI 12/23/21 IMPRESSION: Severe appearing rotator cuff tendinopathy with a 1.3 cm from front to back full-thickness tear of the anterior and far lateral supraspinatus. Retraction is 1-2 cm. No focal atrophy.   Mild fatty atrophy of all imaged muscles is likely due to disuse.   Mild to moderate acromioclavicular osteoarthritis.   PATIENT SURVEYS:  FOTO 45   COGNITION:           Overall cognitive status: Within functional limits for tasks assessed                                  SENSATION: WFL   POSTURE: Forward head, rounded shoulders, L upper trap elevation       UPPER EXTREMITY ROM:   Active/passive ROM Right eval Left eval Right 01/24/22  Shoulder flexion 95p! But able to get to 120 with effort 160 144p!  Shoulder  abduction 95 p! 140 120 p!  Elbow flexion [facility] WFL   Elbow extension WFL WFL    (Blank rows = not tested) Comments: significant UT compensations with active elevation BUE       UPPER EXTREMITY MMT:   MMT Right eval Left eval  Shoulder flexion 3+ 4-  Shoulder internal rotation 4 4  Shoulder external rotation 4 4  Elbow flexion 5 5  Elbow extension 5 5   (Blank rows = not tested)      JOINT MOBILITY TESTING:  Deferred given time constraints   PALPATION:  TTP R UT, supraspinatus muscle belly, infraspinatus, latissimus dorsi and subscap             TODAY'S TREATMENT:  [facility] Adult PT Treatment:                                                DATE: 01/24/22 Therapeutic Exercise: Wall ball flexion x12 Rows GTB  2x15 Shoulder extensions RTB 2x15 Shoulder scaption w RTB around wrists, up to 90deg, x8 RTB ER RUE only 3x10 Shoulder flexion w block IR/add up to 90 deg, 2x8   [facility] Adult PT Treatment:                                                DATE: 01/19/22 Therapeutic Exercise: Wall ball flexion x15 Rows YTB x15, cues for reduced UT activation Verbal review HEP  Neuromuscular re-ed: Swiss ball flexion perturbation at 90deg, 2x45sec closed chain RC stability  Shoulder extensions YTB 2x12 for improved lat activation Shoulder scaption RTB around wrists 3x8 for improved RC activation into overhead movements, up to 90 deg only, tactile/verbal cues as needed   Shoulder ER YTB walk out x8 bilat for improved RC activation, cues for form       PATIENT EDUCATION: Education details: discussed HEP, progression with therapy, rationale for interventions throughout Person educated: Patient Education method: Explanation, Demonstration, Tactile cues, Verbal cues Education comprehension: verbalized understanding, returned demonstration, verbal cues required, tactile cues required, and needs further education      HOME EXERCISE PROGRAM: Access Code: [email or web address] URL: [email or web address] Date: 01/09/2022 Prepared by: Enis Slipper   Exercises - Seated Scapular Retraction  - 1 x daily - 7 x weekly - 3 sets - 10 reps - Standing 'L' Stretch at Counter  - 1 x daily - 7 x weekly - 3 sets - 10 reps - Isometric Shoulder Flexion at Wall  - 1 x daily - 7 x weekly - 3 sets - 10 reps   ASSESSMENT:   CLINICAL IMPRESSION: Patient is a 66 y.o. woman who was seen today for physical therapy evaluation and treatment for R shoulder pain ongoing for approximately 2 years. Today's session emphasizing continued progression for RC/periscapular strengthening/endurance. Able to progress for increased resistance for strengthening exercises and increased complexity of exercise. Also measured active shoulder  flexion/abduction at 144 deg and 120deg respectively, both with pain but significantly improved compared to 120/95 deg each on evaluation. Pt remains limited by reduced [place] stability and RC strength, reduced GH mobility. Departs with report of improved pain/stiffness, continues to report feeling better the more she exercises.   Recommend skilled PT to address aforementioned deficits to maximize functional independence  and tolerance. Pt departs today's session in no acute distress, all voiced questions/concerns addressed appropriately from PT perspective.        OBJECTIVE IMPAIRMENTS decreased activity tolerance, decreased endurance, decreased mobility, decreased ROM, decreased strength, postural dysfunction, and pain.    ACTIVITY LIMITATIONS carrying, lifting, bending, sleeping, bathing, dressing, reach over head, and hygiene/grooming   PARTICIPATION LIMITATIONS: meal prep, cleaning, laundry, driving, and shopping   PERSONAL FACTORS Time since onset of injury/illness/exacerbation are also affecting patient's functional outcome.    REHAB POTENTIAL: Good   CLINICAL DECISION MAKING: Stable/uncomplicated   EVALUATION COMPLEXITY: Low     GOALS: Goals reviewed with patient? Yes   SHORT TERM GOALS: Target date: 01/30/2022   Pt will demonstrate appropriate understanding and performance of initially prescribed HEP in order to facilitate improved independence with management of symptoms.  Baseline: HEP provided on eval Goal status: INITIAL    2. Pt will score greater than or equal to 53 on FOTO in order to demonstrate improved perception of function due to symptoms.            Baseline: 45            Goal status: INITIAL   LONG TERM GOALS: Target date: 02/20/2022   Pt will score 62 on FOTO in order to demonstrate improved perception of function d/t neck/shoulder pain. Baseline: 45 Goal status: INITIAL   2.  Pt will demonstrate at least 140 degrees of active shoulder elevation in order to  demonstrate improved tolerance to functional movement patterns such as reaching overhead for dishes. Baseline: 120 flexion, 95 deg abduction on R Goal status: INITIAL   3.  Pt will demonstrate at least 4+/5 R shoulder flex for improved symmetry of UE strength and improved tolerance to functional movements.  Baseline:  Goal status: INITIAL   4. Pt will report ability to perform typical household activity such as lifting 10# with less than 2 point increase in shoulder pain on NPS in order to demonstrate improved functional tolerance to daily activities such as laundry.              Baseline: NT due to pain            Goal status: INITIAL        PLAN: PT FREQUENCY: 2x/week   PT DURATION: 6 weeks   PLANNED INTERVENTIONS: Therapeutic exercises, Therapeutic activity, Neuromuscular re-education, Balance training, Gait training, Patient/Family education, Self Care, Joint mobilization, Joint manipulation, Aquatic Therapy, Dry Needling, Electrical stimulation, Spinal manipulation, Spinal mobilization, Cryotherapy, Moist heat, Manual therapy, and Re-evaluation   PLAN FOR NEXT SESSION: Continue to progress strengthening as able/appropriate. Incorporate more specific rotator cuff work, consider update to HEP    Leeroy Cha PT, DPT 01/24/2022 10:13 AM

## 2022-01-24 ENCOUNTER — Ambulatory Visit: Payer: Medicare Other | Admitting: Physical Therapy

## 2022-01-24 ENCOUNTER — Encounter: Payer: Self-pay | Admitting: Physical Therapy

## 2022-01-24 DIAGNOSIS — M6281 Muscle weakness (generalized): Secondary | ICD-10-CM

## 2022-01-24 DIAGNOSIS — G8929 Other chronic pain: Secondary | ICD-10-CM

## 2022-01-24 DIAGNOSIS — M25611 Stiffness of right shoulder, not elsewhere classified: Secondary | ICD-10-CM

## 2022-01-24 DIAGNOSIS — M25511 Pain in right shoulder: Secondary | ICD-10-CM | POA: Diagnosis not present

## 2022-01-25 NOTE — Therapy (Signed)
OUTPATIENT PHYSICAL THERAPY TREATMENT NOTE   Patient Name: Shirley Mays MRN: [id number] DOB:19-Oct-1955, 66 y.o., female Today's Date: 01/26/2022  PCP: Hoy Register MD   REFERRING PROVIDER: Cammy Copa, MD  END OF SESSION:   PT End of Session - 01/26/22 1103     Visit Number 5    Number of Visits 13    Date for PT Re-Evaluation 02/20/22    Authorization Type Medicare part A and B    Progress Note Due on Visit 10    PT Start Time 1103    PT Stop Time 1143    PT Time Calculation (min) 40 min    Activity Tolerance Patient tolerated treatment well    Behavior During Therapy WFL for tasks assessed/performed                Past Medical History:  Diagnosis Date   Alcohol abuse    Cirrhosis ([facility])    Hiatal hernia    Pneumonia    Pyelonephritis    Sigmoid diverticulosis    Past Surgical History:  Procedure Laterality Date   IR PARACENTESIS  10/16/2019   IR TRANSCATHETER BX  02/20/2020   IR US GUIDE VASC ACCESS RIGHT  02/20/2020   IR VENOGRAM HEPATIC W HEMODYNAMIC EVALUATION  02/20/2020   TUBAL LIGATION     Patient Active Problem List   Diagnosis Date Noted   CAP (community acquired pneumonia) 10/15/2019   Sepsis ([facility]) 10/15/2019   Liver disease 10/15/2019   Anemia 10/15/2019   Thrombocytopenia ([facility]) 10/15/2019    REFERRING DIAG: REFERRING DIAG: M25.511 (ICD-10-CM) - Right shoulder pain, unspecified chronicity  THERAPY DIAG:  Chronic right shoulder pain  Stiffness of right shoulder, not elsewhere classified  Muscle weakness (generalized)  Rationale for Evaluation and Treatment Rehabilitation  PERTINENT HISTORY: none  PRECAUTIONS: none  SUBJECTIVE:  Pt arrives and states she feels good aside from some dizziness she attributes to medication change this week. VSS at beginning of session (HR 80s, SpO2 96-97%, BP 160/95). Pt states she typically runs in 150s systolic at home and that dizziness is a side effect of her medications. Does not endorse any  other adverse symptoms. States dizziness improves as session goes on. Pt states she continues to feel better overall and has noticed less morning pain.      PAIN:  Are you having pain: 2/10 Location: R shoulder How would you describe your pain? Shooting pain Best in past week: 2/10 (on eval 6/10) Worst in past week: 8/10 (on eval 10/10) Aggravating factors: lifting arm, sleeping, getting out of bed, picking up objects from floor, self care and grooming activities such as putting on deodorant Easing factors: stretching, tylenol    OBJECTIVE: (objective measures completed at initial evaluation unless otherwise dated)   DIAGNOSTIC FINDINGS:  MRI 12/23/21 IMPRESSION: Severe appearing rotator cuff tendinopathy with a 1.3 cm from front to back full-thickness tear of the anterior and far lateral supraspinatus. Retraction is 1-2 cm. No focal atrophy.   Mild fatty atrophy of all imaged muscles is likely due to disuse.   Mild to moderate acromioclavicular osteoarthritis.   PATIENT SURVEYS:  FOTO 45   COGNITION:           Overall cognitive status: Within functional limits for tasks assessed                                  SENSATION: WFL   POSTURE: Forward head,  rounded shoulders, L upper trap elevation       UPPER EXTREMITY ROM:   Active/passive ROM Right eval Left eval Right 01/24/22  Shoulder flexion 95p! But able to get to 120 with effort 160 144p!  Shoulder abduction 95 p! 140 120 p!  Elbow flexion [facility] WFL   Elbow extension WFL WFL    (Blank rows = not tested) Comments: significant UT compensations with active elevation BUE       UPPER EXTREMITY MMT:   MMT Right eval Left eval  Shoulder flexion 3+ 4-  Shoulder internal rotation 4 4  Shoulder external rotation 4 4  Elbow flexion 5 5  Elbow extension 5 5   (Blank rows = not tested)      JOINT MOBILITY TESTING:  Deferred given time constraints   PALPATION:  TTP R UT, supraspinatus muscle belly,  infraspinatus, latissimus dorsi and subscap             TODAY'S TREATMENT:  [facility] Adult PT Treatment:                                                DATE: 01/26/22 Therapeutic Exercise: Wall ball flexion 2x12 Rows GTB 2x15 Rows + B ER RTB 2x8 Shoulder ext GTB 3x8 Neuromuscular re-ed: Shoulder scaption RTB around wrists 2x10 for improved RC activation into overhead movements, up to 120 deg only, tactile/verbal cues as needed   Swiss ball flexion perturbation at 120 deg, 2x30 sec closed chain RC stability    [facility] Adult PT Treatment:                                                DATE: 01/24/22 Therapeutic Exercise: Wall ball flexion x12 Rows GTB 2x15 Shoulder extensions RTB 2x15 Shoulder scaption w RTB around wrists, up to 90deg, x8 RTB ER RUE only 3x10 Shoulder flexion w block IR/add up to 90 deg, 2x8        PATIENT EDUCATION: Education details: discussed HEP, progression with therapy, rationale for interventions throughout Person educated: Patient Education method: Explanation, Demonstration, Tactile cues, Verbal cues Education comprehension: verbalized understanding, returned demonstration, verbal cues required, tactile cues required, and needs further education      HOME EXERCISE PROGRAM: Access Code: [email or web address] URL: [email or web address] Date: 01/09/2022 Prepared by: Enis Slipper   Exercises - Seated Scapular Retraction  - 1 x daily - 7 x weekly - 3 sets - 10 reps - Standing 'L' Stretch at Counter  - 1 x daily - 7 x weekly - 3 sets - 10 reps - Isometric Shoulder Flexion at Wall  - 1 x daily - 7 x weekly - 3 sets - 10 reps   ASSESSMENT:   CLINICAL IMPRESSION: Patient is a 66 y.o. woman who was seen today for physical therapy evaluation and treatment for R shoulder pain ongoing for approximately 2 years. Today pt arrives with report of mild dizziness she attributes to recent medication change, VSS as noted in subjective and pt reports improvement in  dizziness as session progresses. Maintained program emphasizing periscapular/GH stability with progression into overhead ranges. Pt tolerates well, denies any increase in symptoms as session progresses and reports resolution of dizziness on departure. Primary limiting factor is muscular fatigue on this  date.  Recommend skilled PT to address aforementioned deficits to maximize functional independence and tolerance. Pt departs today's session in no acute distress, all voiced questions/concerns addressed appropriately from PT perspective.      OBJECTIVE IMPAIRMENTS decreased activity tolerance, decreased endurance, decreased mobility, decreased ROM, decreased strength, postural dysfunction, and pain.    ACTIVITY LIMITATIONS carrying, lifting, bending, sleeping, bathing, dressing, reach over head, and hygiene/grooming   PARTICIPATION LIMITATIONS: meal prep, cleaning, laundry, driving, and shopping   PERSONAL FACTORS Time since onset of injury/illness/exacerbation are also affecting patient's functional outcome.    REHAB POTENTIAL: Good   CLINICAL DECISION MAKING: Stable/uncomplicated   EVALUATION COMPLEXITY: Low     GOALS: Goals reviewed with patient? Yes   SHORT TERM GOALS: Target date: 01/30/2022   Pt will demonstrate appropriate understanding and performance of initially prescribed HEP in order to facilitate improved independence with management of symptoms.  Baseline: HEP provided on eval Goal status: INITIAL    2. Pt will score greater than or equal to 53 on FOTO in order to demonstrate improved perception of function due to symptoms.            Baseline: 45            Goal status: INITIAL   LONG TERM GOALS: Target date: 02/20/2022   Pt will score 62 on FOTO in order to demonstrate improved perception of function d/t neck/shoulder pain. Baseline: 45 Goal status: INITIAL   2.  Pt will demonstrate at least 140 degrees of active shoulder elevation in order to demonstrate improved  tolerance to functional movement patterns such as reaching overhead for dishes. Baseline: 120 flexion, 95 deg abduction on R Goal status: INITIAL   3.  Pt will demonstrate at least 4+/5 R shoulder flex for improved symmetry of UE strength and improved tolerance to functional movements.  Baseline:  Goal status: INITIAL   4. Pt will report ability to perform typical household activity such as lifting 10# with less than 2 point increase in shoulder pain on NPS in order to demonstrate improved functional tolerance to daily activities such as laundry.              Baseline: NT due to pain            Goal status: INITIAL        PLAN: PT FREQUENCY: 2x/week   PT DURATION: 6 weeks   PLANNED INTERVENTIONS: Therapeutic exercises, Therapeutic activity, Neuromuscular re-education, Balance training, Gait training, Patient/Family education, Self Care, Joint mobilization, Joint manipulation, Aquatic Therapy, Dry Needling, Electrical stimulation, Spinal manipulation, Spinal mobilization, Cryotherapy, Moist heat, Manual therapy, and Re-evaluation   PLAN FOR NEXT SESSION: Administer FOTO, update HEP, progress strengthening as able/appropriate    Ashley Murrain PT, DPT 01/26/2022 11:45 AM

## 2022-01-26 ENCOUNTER — Ambulatory Visit: Payer: Medicare Other | Admitting: Physical Therapy

## 2022-01-26 ENCOUNTER — Encounter: Payer: Self-pay | Admitting: Physical Therapy

## 2022-01-26 DIAGNOSIS — M25611 Stiffness of right shoulder, not elsewhere classified: Secondary | ICD-10-CM

## 2022-01-26 DIAGNOSIS — M25511 Pain in right shoulder: Secondary | ICD-10-CM | POA: Diagnosis not present

## 2022-01-26 DIAGNOSIS — G8929 Other chronic pain: Secondary | ICD-10-CM

## 2022-01-26 DIAGNOSIS — M6281 Muscle weakness (generalized): Secondary | ICD-10-CM

## 2022-01-30 NOTE — Therapy (Signed)
OUTPATIENT PHYSICAL THERAPY TREATMENT NOTE   Patient Name: Shirley Mays MRN: [id number] DOB:[birth year], 66 y.o., female Today's Date: 01/31/2022  PCP: Charlott Rakes MD   REFERRING PROVIDER: Meredith Pel, MD  END OF SESSION:   PT End of Session - 01/31/22 1018     Visit Number 6    Number of Visits 13    Date for PT Re-Evaluation 02/20/22    Authorization Type Medicare part A and B    Progress Note Due on Visit 10    PT Start Time 1018    PT Stop Time 1055    PT Time Calculation (min) 37 min    Activity Tolerance Patient tolerated treatment well    Behavior During Therapy WFL for tasks assessed/performed                 Past Medical History:  Diagnosis Date   Alcohol abuse    Cirrhosis ([place])    Hiatal hernia    Pneumonia    Pyelonephritis    Sigmoid diverticulosis    Past Surgical History:  Procedure Laterality Date   IR PARACENTESIS  10/16/2019   IR TRANSCATHETER BX  02/20/2020   IR US GUIDE VASC ACCESS RIGHT  02/20/2020   IR VENOGRAM HEPATIC W HEMODYNAMIC EVALUATION  02/20/2020   TUBAL LIGATION     Patient Active Problem List   Diagnosis Date Noted   CAP (community acquired pneumonia) 10/15/2019   Sepsis ([place]) 10/15/2019   Liver disease 10/15/2019   Anemia 10/15/2019   Thrombocytopenia ([place]) 10/15/2019    REFERRING DIAG: REFERRING DIAG: M25.511 (ICD-10-CM) - Right shoulder pain, unspecified chronicity  THERAPY DIAG:  Chronic right shoulder pain  Stiffness of right shoulder, not elsewhere classified  Muscle weakness (generalized)  Rationale for Evaluation and Treatment Rehabilitation  PERTINENT HISTORY: none  PRECAUTIONS: none  SUBJECTIVE:  Pt arrives with little to no pain, states she is doing well overall. Pt states has been very busy and hasn't had time to do her HEP all the time. Reports mild soreness after last session that resolved the next day.    PAIN:  Are you having pain: 2/10 Location: R shoulder How would you describe  your pain? Shooting pain Best in past week: 1/10 (on eval 6/10) Worst in past week: 8/10 (on eval 10/10) Aggravating factors: lifting arm, sleeping, getting out of bed, picking up objects from floor, self care and grooming activities such as putting on deodorant Easing factors: stretching, tylenol    OBJECTIVE: (objective measures completed at initial evaluation unless otherwise dated)   DIAGNOSTIC FINDINGS:  MRI 12/23/21 IMPRESSION: Severe appearing rotator cuff tendinopathy with a 1.3 cm from front to back full-thickness tear of the anterior and far lateral supraspinatus. Retraction is 1-2 cm. No focal atrophy.   Mild fatty atrophy of all imaged muscles is likely due to disuse.   Mild to moderate acromioclavicular osteoarthritis.   PATIENT SURVEYS:  FOTO 45 55 FOTO 01/31/22   COGNITION:           Overall cognitive status: Within functional limits for tasks assessed                                  SENSATION: WFL   POSTURE: Forward head, rounded shoulders, L upper trap elevation       UPPER EXTREMITY ROM:   Active/passive ROM Right eval Left eval Right 01/24/22  Shoulder flexion 95p! But able to get to  120 with effort 160 144p!  Shoulder abduction 95 p! 140 120 p!  Elbow flexion [facility] WFL   Elbow extension WFL WFL    (Blank rows = not tested) Comments: significant UT compensations with active elevation BUE       UPPER EXTREMITY MMT:   MMT Right eval Left eval  Shoulder flexion 3+ 4-  Shoulder internal rotation 4 4  Shoulder external rotation 4 4  Elbow flexion 5 5  Elbow extension 5 5   (Blank rows = not tested)      JOINT MOBILITY TESTING:  Deferred given time constraints   PALPATION:  TTP R UT, supraspinatus muscle belly, infraspinatus, latissimus dorsi and subscap             TODAY'S TREATMENT:  [facility] Adult PT Treatment:                                                DATE: 01/31/22 Therapeutic Exercise: Wall ball flexion + press x20 Cable column  rows 7# each UE, 2x10 Shoulder ext 3# each 2x8 GTB pull apart 3x8 Neuromuscular re-ed: Shoulder scaption RTB around wrists 2x12 for improved RC activation into overhead movements, up to 120 deg only, tactile/verbal cues as needed   Swiss ball flexion perturbation at 120 deg, 2x30 sec closed chain RC stability Modified counter plank 2x30sec for closed chain RC stability  [facility] Adult PT Treatment:                                                DATE: 01/26/22 Therapeutic Exercise: Wall ball flexion 2x12 Rows GTB 2x15 Rows + B ER RTB 2x8 Shoulder ext GTB 3x8 Neuromuscular re-ed: Shoulder scaption RTB around wrists 2x10 for improved RC activation into overhead movements, up to 120 deg only, tactile/verbal cues as needed   Swiss ball flexion perturbation at 120 deg, 2x30 sec closed chain RC stability    [facility] Adult PT Treatment:                                                DATE: 01/24/22 Therapeutic Exercise: Wall ball flexion x12 Rows GTB 2x15 Shoulder extensions RTB 2x15 Shoulder scaption w RTB around wrists, up to 90deg, x8 RTB ER RUE only 3x10 Shoulder flexion w block IR/add up to 90 deg, 2x8        PATIENT EDUCATION: Education details: discussed HEP, progression with therapy, rationale for interventions throughout Person educated: Patient Education method: Explanation, Demonstration, Tactile cues, Verbal cues Education comprehension: verbalized understanding, returned demonstration, verbal cues required, tactile cues required, and needs further education      HOME EXERCISE PROGRAM: Access Code: [email or web address] URL: [email or web address] Date: 01/09/2022 Prepared by: Enis Slipper   Exercises - Seated Scapular Retraction  - 1 x daily - 7 x weekly - 3 sets - 10 reps - Standing 'L' Stretch at Counter  - 1 x daily - 7 x weekly - 3 sets - 10 reps - Isometric Shoulder Flexion at Wall  - 1 x daily - 7 x weekly - 3 sets - 10 reps   ASSESSMENT:  CLINICAL  IMPRESSION: Patient is a 66 y.o. woman who was seen today for physical therapy treatment for R shoulder pain ongoing for approximately 2 years. Pt able to progress for periscapular stability/strengthening at cable column on this date with good tolerance. Also able to progress RC stability exercises for increased overhead range and additional closed chain exercises. Pt tolerates session well, reports muscle working sensation but no increase in pain.   Recommend skilled PT to address aforementioned deficits to maximize functional independence and tolerance. Pt departs today's session in no acute distress, all voiced questions/concerns addressed appropriately from PT perspective.      OBJECTIVE IMPAIRMENTS decreased activity tolerance, decreased endurance, decreased mobility, decreased ROM, decreased strength, postural dysfunction, and pain.    ACTIVITY LIMITATIONS carrying, lifting, bending, sleeping, bathing, dressing, reach over head, and hygiene/grooming   PARTICIPATION LIMITATIONS: meal prep, cleaning, laundry, driving, and shopping   PERSONAL FACTORS Time since onset of injury/illness/exacerbation are also affecting patient's functional outcome.    REHAB POTENTIAL: Good   CLINICAL DECISION MAKING: Stable/uncomplicated   EVALUATION COMPLEXITY: Low     GOALS: Goals reviewed with patient? Yes   SHORT TERM GOALS: Target date: 01/30/2022   Pt will demonstrate appropriate understanding and performance of initially prescribed HEP in order to facilitate improved independence with management of symptoms.  Baseline: HEP provided on eval Goal status: MET    2. Pt will score greater than or equal to 53 on FOTO in order to demonstrate improved perception of function due to symptoms.            Baseline: 45  01/31/22: 55            Goal status: MET   LONG TERM GOALS: Target date: 02/20/2022   Pt will score 62 on FOTO in order to demonstrate improved perception of function d/t neck/shoulder  pain. Baseline: 45 01/31/22: 55 Goal status: progressing   2.  Pt will demonstrate at least 140 degrees of active shoulder elevation in order to demonstrate improved tolerance to functional movement patterns such as reaching overhead for dishes. Baseline: 120 flexion, 95 deg abduction on R Goal status: INITIAL   3.  Pt will demonstrate at least 4+/5 R shoulder flex for improved symmetry of UE strength and improved tolerance to functional movements.  Baseline:  Goal status: INITIAL   4. Pt will report ability to perform typical household activity such as lifting 10# with less than 2 point increase in shoulder pain on NPS in order to demonstrate improved functional tolerance to daily activities such as laundry.              Baseline: NT due to pain            Goal status: INITIAL        PLAN: PT FREQUENCY: 2x/week   PT DURATION: 6 weeks   PLANNED INTERVENTIONS: Therapeutic exercises, Therapeutic activity, Neuromuscular re-education, Balance training, Gait training, Patient/Family education, Self Care, Joint mobilization, Joint manipulation, Aquatic Therapy, Dry Needling, Electrical stimulation, Spinal manipulation, Spinal mobilization, Cryotherapy, Moist heat, Manual therapy, and Re-evaluation   PLAN FOR NEXT SESSION: continue to progress strength/mobility as able/appropriate    Leeroy Cha PT, DPT 01/31/2022 10:56 AM

## 2022-01-31 ENCOUNTER — Ambulatory Visit: Payer: Medicare Other | Attending: Family Medicine | Admitting: Physical Therapy

## 2022-01-31 ENCOUNTER — Encounter: Payer: Self-pay | Admitting: Physical Therapy

## 2022-01-31 DIAGNOSIS — M6281 Muscle weakness (generalized): Secondary | ICD-10-CM | POA: Insufficient documentation

## 2022-01-31 DIAGNOSIS — G8929 Other chronic pain: Secondary | ICD-10-CM | POA: Insufficient documentation

## 2022-01-31 DIAGNOSIS — M25511 Pain in right shoulder: Secondary | ICD-10-CM | POA: Insufficient documentation

## 2022-01-31 DIAGNOSIS — M25611 Stiffness of right shoulder, not elsewhere classified: Secondary | ICD-10-CM | POA: Insufficient documentation

## 2022-02-01 NOTE — Therapy (Signed)
OUTPATIENT PHYSICAL THERAPY TREATMENT NOTE   Patient Name: Shirley Mays MRN: [id number] DOB:1956-01-21, 66 y.o., female Today's Date: 02/02/2022  PCP: Charlott Rakes MD   REFERRING PROVIDER: Meredith Pel, MD  END OF SESSION:   PT End of Session - 02/02/22 1014     Visit Number 7    Number of Visits 13    Date for PT Re-Evaluation 02/20/22    Authorization Type Medicare part A and B    Progress Note Due on Visit 10    PT Start Time 1016    PT Stop Time 1059    PT Time Calculation (min) 43 min    Activity Tolerance Patient tolerated treatment well    Behavior During Therapy WFL for tasks assessed/performed                  Past Medical History:  Diagnosis Date   Alcohol abuse    Cirrhosis ([employer])    Hiatal hernia    Pneumonia    Pyelonephritis    Sigmoid diverticulosis    Past Surgical History:  Procedure Laterality Date   IR PARACENTESIS  10/16/2019   IR TRANSCATHETER BX  02/20/2020   IR US GUIDE VASC ACCESS RIGHT  02/20/2020   IR VENOGRAM HEPATIC W HEMODYNAMIC EVALUATION  02/20/2020   TUBAL LIGATION     Patient Active Problem List   Diagnosis Date Noted   CAP (community acquired pneumonia) 10/15/2019   Sepsis ([place]) 10/15/2019   Liver disease 10/15/2019   Anemia 10/15/2019   Thrombocytopenia ([place]) 10/15/2019    REFERRING DIAG: REFERRING DIAG: M25.511 (ICD-10-CM) - Right shoulder pain, unspecified chronicity  THERAPY DIAG:  Chronic right shoulder pain  Stiffness of right shoulder, not elsewhere classified  Muscle weakness (generalized)  Rationale for Evaluation and Treatment Rehabilitation  PERTINENT HISTORY: none  PRECAUTIONS: none  SUBJECTIVE:  Pt arrives with 3/10 pain on NPS she attributes to trying to reach behind her back. Denies soreness after last session, no other new updates - states that the intensity of her pain in general seems to have improved significantly and is much less irritable.     PAIN:  Are you having pain:  3/10 Location: R shoulder How would you describe your pain? Shooting pain Best in past week: 1/10 (on eval 6/10) Worst in past week: 3/10 (on eval 10/10) Aggravating factors: lifting arm, sleeping, getting out of bed, picking up objects from floor, self care and grooming activities such as putting on deodorant Easing factors: stretching, tylenol    OBJECTIVE: (objective measures completed at initial evaluation unless otherwise dated)   DIAGNOSTIC FINDINGS:  MRI 12/23/21 IMPRESSION: Severe appearing rotator cuff tendinopathy with a 1.3 cm from front to back full-thickness tear of the anterior and far lateral supraspinatus. Retraction is 1-2 cm. No focal atrophy.   Mild fatty atrophy of all imaged muscles is likely due to disuse.   Mild to moderate acromioclavicular osteoarthritis.   PATIENT SURVEYS:  FOTO 45 55 FOTO 01/31/22   COGNITION:           Overall cognitive status: Within functional limits for tasks assessed                                  SENSATION: WFL   POSTURE: Forward head, rounded shoulders, L upper trap elevation       UPPER EXTREMITY ROM:   Active/passive ROM Right eval Left eval Right 01/24/22 Right 02/02/22  Shoulder flexion 95p! But able to get to 120 with effort 160 144p! 155 mild pain during lowering  Shoulder abduction 95 p! 140 120 p! 125p!  Elbow flexion [facility] WFL    Elbow extension WFL WFL     (Blank rows = not tested) Comments: significant UT compensations with active elevation BUE       UPPER EXTREMITY MMT:   MMT Right eval Left eval  Shoulder flexion 3+ 4-  Shoulder internal rotation 4 4  Shoulder external rotation 4 4  Elbow flexion 5 5  Elbow extension 5 5   (Blank rows = not tested)      JOINT MOBILITY TESTING:  Deferred given time constraints   PALPATION:  TTP R UT, supraspinatus muscle belly, infraspinatus, latissimus dorsi and subscap             TODAY'S TREATMENT:  [facility] Adult PT Treatment:                                                 DATE: 02/02/22 Therapeutic Exercise: Wall ball flexion + press + lift off BUE x10 Cable column rows 7# each UE, 2x10 Shoulder ext 3# each x12 YTB IR eccentric walkouts x8  Neuromuscular re-ed: Modified counter plank 3x30sec for closed chain RC stability Raised UE RTB pull aparts for improved overhead stability (~120 deg) 2x8 High>low row 7# unilat 2x10 (for improved lat activation, cues for setup and posture)   [facility] Adult PT Treatment:                                                DATE: 01/31/22 Therapeutic Exercise: Wall ball flexion + press x20 Cable column rows 7# each UE, 2x10 Shoulder ext 3# each 2x8 GTB pull apart 3x8 Neuromuscular re-ed: Shoulder scaption RTB around wrists 2x12 for improved RC activation into overhead movements, up to 120 deg only, tactile/verbal cues as needed   Swiss ball flexion perturbation at 120 deg, 2x30 sec closed chain RC stability Modified counter plank 2x30sec for closed chain RC stability    PATIENT EDUCATION: Education details: discussed HEP, progression with therapy, rationale for interventions throughout Person educated: Patient Education method: Explanation, Demonstration, Tactile cues, Verbal cues Education comprehension: verbalized understanding, returned demonstration, verbal cues required, tactile cues required, and needs further education      HOME EXERCISE PROGRAM: Access Code: [email or web address] URL: [email or web address] Date: 01/09/2022 Prepared by: Enis Slipper   Exercises - Seated Scapular Retraction  - 1 x daily - 7 x weekly - 3 sets - 10 reps - Standing 'L' Stretch at Counter  - 1 x daily - 7 x weekly - 3 sets - 10 reps - Isometric Shoulder Flexion at Wall  - 1 x daily - 7 x weekly - 3 sets - 10 reps   ASSESSMENT:   CLINICAL IMPRESSION: Patient is a 66 y.o. woman who was seen today for physical therapy treatment for R shoulder pain ongoing for approximately 2 years. Pt arrives with slightly  increased pain she attributes to trying to reach behind back this morning, although she still tolerates treatment well. No increase in pain with activity today, able to progress for increased demand with overhead movement and open/closed chain GH stability exercises. [place] AROM  continues to improve, see chart above, although she remains more limited with abduction relative to flexion. Pt departs with report of improved pain (1/10 on NPS compared to 3/10 on arrival), muscular fatigue.    Recommend skilled PT to address aforementioned deficits to maximize functional independence and tolerance. Pt departs today's session in no acute distress, all voiced questions/concerns addressed appropriately from PT perspective.      OBJECTIVE IMPAIRMENTS decreased activity tolerance, decreased endurance, decreased mobility, decreased ROM, decreased strength, postural dysfunction, and pain.    ACTIVITY LIMITATIONS carrying, lifting, bending, sleeping, bathing, dressing, reach over head, and hygiene/grooming   PARTICIPATION LIMITATIONS: meal prep, cleaning, laundry, driving, and shopping   PERSONAL FACTORS Time since onset of injury/illness/exacerbation are also affecting patient's functional outcome.    REHAB POTENTIAL: Good   CLINICAL DECISION MAKING: Stable/uncomplicated   EVALUATION COMPLEXITY: Low     GOALS: Goals reviewed with patient? Yes   SHORT TERM GOALS: Target date: 01/30/2022   Pt will demonstrate appropriate understanding and performance of initially prescribed HEP in order to facilitate improved independence with management of symptoms.  Baseline: HEP provided on eval Goal status: MET    2. Pt will score greater than or equal to 53 on FOTO in order to demonstrate improved perception of function due to symptoms.            Baseline: 45  01/31/22: 55            Goal status: MET   LONG TERM GOALS: Target date: 02/20/2022   Pt will score 62 on FOTO in order to demonstrate improved  perception of function d/t neck/shoulder pain. Baseline: 45 01/31/22: 55 Goal status: progressing   2.  Pt will demonstrate at least 140 degrees of active shoulder elevation in order to demonstrate improved tolerance to functional movement patterns such as reaching overhead for dishes. Baseline: 120 flexion, 95 deg abduction on R Goal status: INITIAL   3.  Pt will demonstrate at least 4+/5 R shoulder flex for improved symmetry of UE strength and improved tolerance to functional movements.  Baseline:  Goal status: INITIAL   4. Pt will report ability to perform typical household activity such as lifting 10# with less than 2 point increase in shoulder pain on NPS in order to demonstrate improved functional tolerance to daily activities such as laundry.              Baseline: NT due to pain            Goal status: INITIAL        PLAN: PT FREQUENCY: 2x/week   PT DURATION: 6 weeks   PLANNED INTERVENTIONS: Therapeutic exercises, Therapeutic activity, Neuromuscular re-education, Balance training, Gait training, Patient/Family education, Self Care, Joint mobilization, Joint manipulation, Aquatic Therapy, Dry Needling, Electrical stimulation, Spinal manipulation, Spinal mobilization, Cryotherapy, Moist heat, Manual therapy, and Re-evaluation   PLAN FOR NEXT SESSION: review/update HEP as appropriate continue to progress strength/mobility as able/appropriate    Leeroy Cha PT, DPT 02/02/2022 11:01 AM

## 2022-02-02 ENCOUNTER — Ambulatory Visit: Payer: Medicare Other | Admitting: Physical Therapy

## 2022-02-02 ENCOUNTER — Encounter: Payer: Self-pay | Admitting: Physical Therapy

## 2022-02-02 DIAGNOSIS — G8929 Other chronic pain: Secondary | ICD-10-CM

## 2022-02-02 DIAGNOSIS — M25611 Stiffness of right shoulder, not elsewhere classified: Secondary | ICD-10-CM

## 2022-02-02 DIAGNOSIS — M6281 Muscle weakness (generalized): Secondary | ICD-10-CM

## 2022-02-02 DIAGNOSIS — M25511 Pain in right shoulder: Secondary | ICD-10-CM | POA: Diagnosis not present

## 2022-02-07 ENCOUNTER — Ambulatory Visit: Payer: Medicare Other | Admitting: Physical Therapy

## 2022-02-07 NOTE — Therapy (Signed)
OUTPATIENT PHYSICAL THERAPY TREATMENT NOTE   Patient Name: Shirley Mays MRN: [id number] DOB:1955-08-30, 66 y.o., female Today's Date: 02/08/2022  PCP: Charlott Rakes MD   REFERRING PROVIDER: Meredith Pel, MD  END OF SESSION:   PT End of Session - 02/08/22 1103     Visit Number 8    Number of Visits 13    Date for PT Re-Evaluation 02/20/22    Authorization Type Medicare part A and B    Progress Note Due on Visit 10    PT Start Time 1103    PT Stop Time 1142    PT Time Calculation (min) 39 min    Activity Tolerance Patient tolerated treatment well    Behavior During Therapy WFL for tasks assessed/performed                   Past Medical History:  Diagnosis Date   Alcohol abuse    Cirrhosis ([place])    Hiatal hernia    Pneumonia    Pyelonephritis    Sigmoid diverticulosis    Past Surgical History:  Procedure Laterality Date   IR PARACENTESIS  10/16/2019   IR TRANSCATHETER BX  02/20/2020   IR US GUIDE VASC ACCESS RIGHT  02/20/2020   IR VENOGRAM HEPATIC W HEMODYNAMIC EVALUATION  02/20/2020   TUBAL LIGATION     Patient Active Problem List   Diagnosis Date Noted   CAP (community acquired pneumonia) 10/15/2019   Sepsis ([place]) 10/15/2019   Liver disease 10/15/2019   Anemia 10/15/2019   Thrombocytopenia ([place]) 10/15/2019    REFERRING DIAG: REFERRING DIAG: M25.511 (ICD-10-CM) - Right shoulder pain, unspecified chronicity  THERAPY DIAG:  Chronic right shoulder pain  Stiffness of right shoulder, not elsewhere classified  Muscle weakness (generalized)  Rationale for Evaluation and Treatment Rehabilitation  PERTINENT HISTORY: none  PRECAUTIONS: none  SUBJECTIVE:  Pt states she was pretty sore after last session but states she has also been doing more lifting and heavy activities at home. Pt reports about 2/10 pain at present with movement. Reports her shoulder still continues to feel better overall, she has just been using it more. Limited HEP  compliance due to being busy.     PAIN:  Are you having pain: 3/10 Location: R shoulder How would you describe your pain? Shooting pain Best in past week: 1/10 (on eval 6/10) Worst in past week: 4/10 (on eval 10/10) Aggravating factors: lifting arm, sleeping, getting out of bed, picking up objects from floor, self care and grooming activities such as putting on deodorant Easing factors: stretching, tylenol    OBJECTIVE: (objective measures completed at initial evaluation unless otherwise dated)   DIAGNOSTIC FINDINGS:  MRI 12/23/21 IMPRESSION: Severe appearing rotator cuff tendinopathy with a 1.3 cm from front to back full-thickness tear of the anterior and far lateral supraspinatus. Retraction is 1-2 cm. No focal atrophy.   Mild fatty atrophy of all imaged muscles is likely due to disuse.   Mild to moderate acromioclavicular osteoarthritis.   PATIENT SURVEYS:  FOTO 10 55 FOTO 01/31/22   COGNITION:           Overall cognitive status: Within functional limits for tasks assessed                                  SENSATION: WFL   POSTURE: Forward head, rounded shoulders, L upper trap elevation       UPPER EXTREMITY ROM:  Active/passive ROM Right eval Left eval Right 01/24/22 Right 02/02/22   Shoulder flexion 95p! But able to get to 120 with effort 160 144p! 155 mild pain during lowering  Shoulder abduction 95 p! 140 120 p! 125p!  Elbow flexion [facility] WFL    Elbow extension WFL WFL     (Blank rows = not tested) Comments: significant UT compensations with active elevation BUE       UPPER EXTREMITY MMT:   MMT Right eval Left eval  Shoulder flexion 3+ 4-  Shoulder internal rotation 4 4  Shoulder external rotation 4 4  Elbow flexion 5 5  Elbow extension 5 5   (Blank rows = not tested)      JOINT MOBILITY TESTING:  Deferred given time constraints   PALPATION:  TTP R UT, supraspinatus muscle belly, infraspinatus, latissimus dorsi and subscap              TODAY'S TREATMENT:  [facility] Adult PT Treatment:                                                DATE: 02/08/22 Therapeutic Exercise: Swiss ball up wall, push + lift x4, modified to remove lift due to mild increase in discomfort that does not improve, x10 with just push RTB IR iso walkouts 2x8 Horizontal abd RTB 2x8  Neuromuscular re-ed: GTB row 2x15 for periscap activation GTB shoulder ext 2x15 lat activation RC perturbations ball at wall, 90 deg elevation 3x30sec   [facility] Adult PT Treatment:                                                DATE: 02/02/22 Therapeutic Exercise: Wall ball flexion + press + lift off BUE x10 Cable column rows 7# each UE, 2x10 Shoulder ext 3# each x12 YTB IR eccentric walkouts x8  Neuromuscular re-ed: Modified counter plank 3x30sec for closed chain RC stability Raised UE RTB pull aparts for improved overhead stability (~120 deg) 2x8 High>low row 7# unilat 2x10 (for improved lat activation, cues for setup and posture)     PATIENT EDUCATION: Education details: discussed HEP, progression with therapy, rationale for interventions throughout Person educated: Patient Education method: Explanation, Demonstration, Tactile cues, Verbal cues Education comprehension: verbalized understanding, returned demonstration, verbal cues required, tactile cues required, and needs further education      HOME EXERCISE PROGRAM: Access Code: [email or web address] URL: [email or web address] Date: 01/09/2022 Prepared by: Enis Slipper   Exercises - Seated Scapular Retraction  - 1 x daily - 7 x weekly - 3 sets - 10 reps - Standing 'L' Stretch at Counter  - 1 x daily - 7 x weekly - 3 sets - 10 reps - Isometric Shoulder Flexion at Wall  - 1 x daily - 7 x weekly - 3 sets - 10 reps   ASSESSMENT:   CLINICAL IMPRESSION: Patient is a 66 y.o. woman who was seen today for physical therapy treatment for R shoulder pain ongoing for approximately 2 years. Pt arrives with mild increase  in pain on this date she attributes to soreness from last session and increased activities at home. Session subsequently modified for improved comfort as overhead activities are more provocative on this date. Pt tolerates session well with modifications, reports  improved symptoms on departure although she reports some fatigue/stretching sensation in anterior shoulder.   Recommend skilled PT to address aforementioned deficits to maximize functional independence and tolerance. Pt departs today's session in no acute distress, all voiced questions/concerns addressed appropriately from PT perspective.      OBJECTIVE IMPAIRMENTS decreased activity tolerance, decreased endurance, decreased mobility, decreased ROM, decreased strength, postural dysfunction, and pain.    ACTIVITY LIMITATIONS carrying, lifting, bending, sleeping, bathing, dressing, reach over head, and hygiene/grooming   PARTICIPATION LIMITATIONS: meal prep, cleaning, laundry, driving, and shopping   PERSONAL FACTORS Time since onset of injury/illness/exacerbation are also affecting patient's functional outcome.    REHAB POTENTIAL: Good   CLINICAL DECISION MAKING: Stable/uncomplicated   EVALUATION COMPLEXITY: Low     GOALS: Goals reviewed with patient? Yes   SHORT TERM GOALS: Target date: 01/30/2022   Pt will demonstrate appropriate understanding and performance of initially prescribed HEP in order to facilitate improved independence with management of symptoms.  Baseline: HEP provided on eval Goal status: MET    2. Pt will score greater than or equal to 53 on FOTO in order to demonstrate improved perception of function due to symptoms.            Baseline: 45  01/31/22: 55            Goal status: MET   LONG TERM GOALS: Target date: 02/20/2022   Pt will score 62 on FOTO in order to demonstrate improved perception of function d/t neck/shoulder pain. Baseline: 45 01/31/22: 55 Goal status: progressing   2.  Pt will  demonstrate at least 140 degrees of active shoulder elevation in order to demonstrate improved tolerance to functional movement patterns such as reaching overhead for dishes. Baseline: 120 flexion, 95 deg abduction on R Goal status: INITIAL   3.  Pt will demonstrate at least 4+/5 R shoulder flex for improved symmetry of UE strength and improved tolerance to functional movements.  Baseline:  Goal status: INITIAL   4. Pt will report ability to perform typical household activity such as lifting 10# with less than 2 point increase in shoulder pain on NPS in order to demonstrate improved functional tolerance to daily activities such as laundry.              Baseline: NT due to pain            Goal status: INITIAL        PLAN: PT FREQUENCY: 2x/week   PT DURATION: 6 weeks   PLANNED INTERVENTIONS: Therapeutic exercises, Therapeutic activity, Neuromuscular re-education, Balance training, Gait training, Patient/Family education, Self Care, Joint mobilization, Joint manipulation, Aquatic Therapy, Dry Needling, Electrical stimulation, Spinal manipulation, Spinal mobilization, Cryotherapy, Moist heat, Manual therapy, and Re-evaluation   PLAN FOR NEXT SESSION:  review/update HEP as appropriate continue to progress strength/mobility as able/appropriate    Leeroy Cha PT, DPT 02/08/2022 11:43 AM

## 2022-02-08 ENCOUNTER — Ambulatory Visit: Payer: Medicare Other | Admitting: Physical Therapy

## 2022-02-08 ENCOUNTER — Encounter: Payer: Self-pay | Admitting: Physical Therapy

## 2022-02-08 DIAGNOSIS — G8929 Other chronic pain: Secondary | ICD-10-CM

## 2022-02-08 DIAGNOSIS — M6281 Muscle weakness (generalized): Secondary | ICD-10-CM

## 2022-02-08 DIAGNOSIS — M25611 Stiffness of right shoulder, not elsewhere classified: Secondary | ICD-10-CM

## 2022-02-08 DIAGNOSIS — M25511 Pain in right shoulder: Secondary | ICD-10-CM | POA: Diagnosis not present

## 2022-02-10 ENCOUNTER — Telehealth: Payer: Self-pay | Admitting: Family Medicine

## 2022-02-10 ENCOUNTER — Ambulatory Visit: Payer: Medicare Other | Admitting: Pharmacist

## 2022-02-10 NOTE — Telephone Encounter (Signed)
Pt is calling to reschedule Luke BP [phone]

## 2022-02-10 NOTE — Telephone Encounter (Signed)
Pt is needing to be rescheduled.

## 2022-02-13 NOTE — Therapy (Signed)
OUTPATIENT PHYSICAL THERAPY TREATMENT NOTE   Patient Name: Shirley Mays MRN: [id number] DOB:03-25-1956, 66 y.o., female Today's Date: 02/14/2022  PCP: Charlott Rakes MD   REFERRING PROVIDER: Meredith Pel, MD  END OF SESSION:   PT End of Session - 02/14/22 1104     Visit Number 9    Number of Visits 13    Date for PT Re-Evaluation 02/20/22    Authorization Type Medicare part A and B    Progress Note Due on Visit 10    PT Start Time 1104    PT Stop Time 1144    PT Time Calculation (min) 40 min    Activity Tolerance Patient tolerated treatment well    Behavior During Therapy WFL for tasks assessed/performed                    Past Medical History:  Diagnosis Date   Alcohol abuse    Cirrhosis ([place])    Hiatal hernia    Pneumonia    Pyelonephritis    Sigmoid diverticulosis    Past Surgical History:  Procedure Laterality Date   IR PARACENTESIS  10/16/2019   IR TRANSCATHETER BX  02/20/2020   IR US GUIDE VASC ACCESS RIGHT  02/20/2020   IR VENOGRAM HEPATIC W HEMODYNAMIC EVALUATION  02/20/2020   TUBAL LIGATION     Patient Active Problem List   Diagnosis Date Noted   CAP (community acquired pneumonia) 10/15/2019   Sepsis ([place]) 10/15/2019   Liver disease 10/15/2019   Anemia 10/15/2019   Thrombocytopenia ([place]) 10/15/2019    REFERRING DIAG: REFERRING DIAG: M25.511 (ICD-10-CM) - Right shoulder pain, unspecified chronicity  THERAPY DIAG:  Chronic right shoulder pain  Stiffness of right shoulder, not elsewhere classified  Muscle weakness (generalized)  Rationale for Evaluation and Treatment Rehabilitation  PERTINENT HISTORY: none  PRECAUTIONS: none  SUBJECTIVE:  Pt states she is not having any pain at present, states her shoulder has been feeling pretty good overall. Pt reports about a day of soreness after last session, no other new updates.    PAIN:  Are you having pain: 0/10 Location: R shoulder How would you describe your pain? Shooting  pain Best in past week: 1/10 (on eval 6/10) Worst in past week: 4/10 (on eval 10/10) Aggravating factors: lifting arm, sleeping, getting out of bed, picking up objects from floor, self care and grooming activities such as putting on deodorant Easing factors: stretching, tylenol    OBJECTIVE: (objective measures completed at initial evaluation unless otherwise dated)   DIAGNOSTIC FINDINGS:  MRI 12/23/21 IMPRESSION: Severe appearing rotator cuff tendinopathy with a 1.3 cm from front to back full-thickness tear of the anterior and far lateral supraspinatus. Retraction is 1-2 cm. No focal atrophy.   Mild fatty atrophy of all imaged muscles is likely due to disuse.   Mild to moderate acromioclavicular osteoarthritis.   PATIENT SURVEYS:  FOTO 45 55 FOTO 01/31/22   COGNITION:           Overall cognitive status: Within functional limits for tasks assessed                                  SENSATION: WFL   POSTURE: Forward head, rounded shoulders, L upper trap elevation       UPPER EXTREMITY ROM:   Active/passive ROM Right eval Left eval Right 01/24/22 Right 02/02/22  Right 02/14/22  Shoulder flexion 95p! But able to get to  120 with effort 160 144p! 155 mild pain during lowering 156 no pain  Shoulder abduction 95 p! 140 120 p! 125p! 147 pain free  Elbow flexion [facility] WFL     Elbow extension WFL WFL      (Blank rows = not tested) Comments: significant UT compensations with active elevation BUE       UPPER EXTREMITY MMT:   MMT Right eval Left eval  Shoulder flexion 3+ 4-  Shoulder internal rotation 4 4  Shoulder external rotation 4 4  Elbow flexion 5 5  Elbow extension 5 5   (Blank rows = not tested)      JOINT MOBILITY TESTING:  Deferred given time constraints   PALPATION:  TTP R UT, supraspinatus muscle belly, infraspinatus, latissimus dorsi and subscap             TODAY'S TREATMENT:  [facility] Adult PT Treatment:                                                DATE:  02/14/22 Therapeutic Exercise: Swiss ball at wall push + lift x12 YTB IR iso walkout + eccentric straightening 2x5  BTB row 2x10 Machine press 10# 2x10  Neuromuscular re-ed: Swiss ball perturbation 2x45sec at 120deg B shoulder scaption w RTB around wrist for improved RC activation 2x8 Modified plank at counter 3x30sec   [facility] Adult PT Treatment:                                                DATE: 02/08/22 Therapeutic Exercise: Swiss ball up wall, push + lift x4, modified to remove lift due to mild increase in discomfort that does not improve, x10 with just push RTB IR iso walkouts 2x8 Horizontal abd RTB 2x8  Neuromuscular re-ed: GTB row 2x15 for periscap activation GTB shoulder ext 2x15 lat activation RC perturbations ball at wall, 90 deg elevation 3x30sec   [facility] Adult PT Treatment:                                                DATE: 02/02/22 Therapeutic Exercise: Wall ball flexion + press + lift off BUE x10 Cable column rows 7# each UE, 2x10 Shoulder ext 3# each x12 YTB IR eccentric walkouts x8  Neuromuscular re-ed: Modified counter plank 3x30sec for closed chain RC stability Raised UE RTB pull aparts for improved overhead stability (~120 deg) 2x8 High>low row 7# unilat 2x10 (for improved lat activation, cues for setup and posture)     PATIENT EDUCATION: Education details: discussed HEP, progression with therapy, rationale for interventions throughout Person educated: Patient Education method: Explanation, Demonstration, Tactile cues, Verbal cues Education comprehension: verbalized understanding, returned demonstration, verbal cues required, tactile cues required, and needs further education      HOME EXERCISE PROGRAM: Access Code: [email or web address] URL: [email or web address] Date: 01/09/2022 Prepared by: Enis Slipper   Exercises - Seated Scapular Retraction  - 1 x daily - 7 x weekly - 3 sets - 10 reps - Standing 'L' Stretch at Counter  - 1 x daily - 7 x  weekly - 3 sets - 10 reps -  Isometric Shoulder Flexion at Wall  - 1 x daily - 7 x weekly - 3 sets - 10 reps   ASSESSMENT:   CLINICAL IMPRESSION: Patient is a 66 y.o. woman who was seen today for physical therapy treatment for R shoulder pain ongoing for approximately 2 years. Pt arrives without significant pain, able to progress for increased demand of rotator cuff musculature and increased emphasis on overhead mobility. Pt denies any increase in symptoms as session progresses, intermittent cues for reduced compensations. Continues with improvements in [place] AROM as noted above. Pt departs with no significant increase in pain, primary complaint of muscular fatigue. Pt departs today's session in no acute distress, all voiced questions/concerns addressed appropriately from PT perspective.      OBJECTIVE IMPAIRMENTS decreased activity tolerance, decreased endurance, decreased mobility, decreased ROM, decreased strength, postural dysfunction, and pain.    ACTIVITY LIMITATIONS carrying, lifting, bending, sleeping, bathing, dressing, reach over head, and hygiene/grooming   PARTICIPATION LIMITATIONS: meal prep, cleaning, laundry, driving, and shopping   PERSONAL FACTORS Time since onset of injury/illness/exacerbation are also affecting patient's functional outcome.    REHAB POTENTIAL: Good   CLINICAL DECISION MAKING: Stable/uncomplicated   EVALUATION COMPLEXITY: Low     GOALS: Goals reviewed with patient? Yes   SHORT TERM GOALS: Target date: 01/30/2022   Pt will demonstrate appropriate understanding and performance of initially prescribed HEP in order to facilitate improved independence with management of symptoms.  Baseline: HEP provided on eval Goal status: MET    2. Pt will score greater than or equal to 53 on FOTO in order to demonstrate improved perception of function due to symptoms.            Baseline: 45  01/31/22: 55            Goal status: MET   LONG TERM GOALS: Target date:  02/20/2022   Pt will score 62 on FOTO in order to demonstrate improved perception of function d/t neck/shoulder pain. Baseline: 45 01/31/22: 55 Goal status: progressing   2.  Pt will demonstrate at least 140 degrees of active shoulder elevation in order to demonstrate improved tolerance to functional movement patterns such as reaching overhead for dishes. Baseline: 120 flexion, 95 deg abduction on R Goal status: INITIAL   3.  Pt will demonstrate at least 4+/5 R shoulder flex for improved symmetry of UE strength and improved tolerance to functional movements.  Baseline:  Goal status: INITIAL   4. Pt will report ability to perform typical household activity such as lifting 10# with less than 2 point increase in shoulder pain on NPS in order to demonstrate improved functional tolerance to daily activities such as laundry.              Baseline: NT due to pain            Goal status: INITIAL        PLAN: PT FREQUENCY: 2x/week   PT DURATION: 6 weeks   PLANNED INTERVENTIONS: Therapeutic exercises, Therapeutic activity, Neuromuscular re-education, Balance training, Gait training, Patient/Family education, Self Care, Joint mobilization, Joint manipulation, Aquatic Therapy, Dry Needling, Electrical stimulation, Spinal manipulation, Spinal mobilization, Cryotherapy, Moist heat, Manual therapy, and Re-evaluation   PLAN FOR NEXT SESSION:  progress note next session, review/update POC    Leeroy Cha PT, DPT 02/14/2022 11:45 AM

## 2022-02-14 ENCOUNTER — Other Ambulatory Visit: Payer: Self-pay | Admitting: Physician Assistant

## 2022-02-14 ENCOUNTER — Encounter: Payer: Self-pay | Admitting: Physical Therapy

## 2022-02-14 ENCOUNTER — Ambulatory Visit: Payer: Medicare Other | Admitting: Physical Therapy

## 2022-02-14 DIAGNOSIS — M25511 Pain in right shoulder: Secondary | ICD-10-CM | POA: Diagnosis not present

## 2022-02-14 DIAGNOSIS — M6281 Muscle weakness (generalized): Secondary | ICD-10-CM

## 2022-02-14 DIAGNOSIS — M79601 Pain in right arm: Secondary | ICD-10-CM

## 2022-02-14 DIAGNOSIS — M7501 Adhesive capsulitis of right shoulder: Secondary | ICD-10-CM

## 2022-02-14 DIAGNOSIS — M25611 Stiffness of right shoulder, not elsewhere classified: Secondary | ICD-10-CM

## 2022-02-14 DIAGNOSIS — M549 Dorsalgia, unspecified: Secondary | ICD-10-CM

## 2022-02-14 DIAGNOSIS — G8929 Other chronic pain: Secondary | ICD-10-CM

## 2022-02-16 ENCOUNTER — Ambulatory Visit: Payer: Medicare Other | Admitting: Physical Therapy

## 2022-02-23 NOTE — Therapy (Addendum)
OUTPATIENT PHYSICAL THERAPY RECERTIFICATION/DISCHARGE NOTE   Patient Name: Shirley Mays MRN: [id number] DOB:08-01-1955, 66 y.o., female Today's Date: 02/24/2022   PHYSICAL THERAPY DISCHARGE SUMMARY  Visits from Start of Care: 10  Current functional level related to goals / functional outcomes: Pt reports some limitations due to LE pain - denies significant limitations due to shoulder   Remaining deficits: Shoulder examination without significant limitation   Education / Equipment: Provided with red and green theraband for HEP; thorough HEP education as below   Patient agrees to discharge. Patient goals were met. Patient is being discharged due to meeting the stated rehab goals; pt pleased with progress.  PCP: Charlott Rakes MD   REFERRING PROVIDER: Meredith Pel, MD  END OF SESSION:   PT End of Session - 02/24/22 1017     Visit Number 10    Number of Visits 10    Date for PT Re-Evaluation 03/24/22    Authorization Type Medicare part A and B    Progress Note Due on Visit --    PT Start Time 1018    PT Stop Time 1058    PT Time Calculation (min) 40 min    Activity Tolerance Patient tolerated treatment well;No increased pain    Behavior During Therapy WFL for tasks assessed/performed                Past Medical History:  Diagnosis Date   Alcohol abuse    Cirrhosis ([place])    Hiatal hernia    Pneumonia    Pyelonephritis    Sigmoid diverticulosis    Past Surgical History:  Procedure Laterality Date   IR PARACENTESIS  10/16/2019   IR TRANSCATHETER BX  02/20/2020   IR US GUIDE VASC ACCESS RIGHT  02/20/2020   IR VENOGRAM HEPATIC W HEMODYNAMIC EVALUATION  02/20/2020   TUBAL LIGATION     Patient Active Problem List   Diagnosis Date Noted   CAP (community acquired pneumonia) 10/15/2019   Sepsis ([place]) 10/15/2019   Liver disease 10/15/2019   Anemia 10/15/2019   Thrombocytopenia ([place]) 10/15/2019    REFERRING DIAG: REFERRING DIAG: M25.511 (ICD-10-CM) -  Right shoulder pain, unspecified chronicity  THERAPY DIAG:  Chronic right shoulder pain - Plan: PT plan of care cert/re-cert  Stiffness of right shoulder, not elsewhere classified - Plan: PT plan of care cert/re-cert  Muscle weakness (generalized) - Plan: PT plan of care cert/re-cert  Rationale for Evaluation and Treatment Rehabilitation  PERTINENT HISTORY: none  PRECAUTIONS: none  SUBJECTIVE:  Pt arrives and states her shoulder has been doing quite well, she is satisfied with progress thus far and feels comfortable with discharging for shoulder. Pt denies any significant functional limitations due to shoulder pain, states legs have been giving her more trouble and she would like to do therapy for that as well, she is advised to discuss with MD   PAIN:  Are you having pain: 1/10 Location: R shoulder How would you describe your pain? Shooting pain Best in past week: 0/10 (on eval 6/10) Worst in past week: 2/10 (on eval 10/10)    OBJECTIVE: (objective measures completed at initial evaluation unless otherwise dated)   DIAGNOSTIC FINDINGS:  MRI 12/23/21 IMPRESSION: Severe appearing rotator cuff tendinopathy with a 1.3 cm from front to back full-thickness tear of the anterior and far lateral supraspinatus. Retraction is 1-2 cm. No focal atrophy.   Mild fatty atrophy of all imaged muscles is likely due to disuse.   Mild to moderate acromioclavicular osteoarthritis.   PATIENT  SURVEYS:  FOTO 45 55 FOTO 01/31/22 02/24/22 FOTO 63   COGNITION:           Overall cognitive status: Within functional limits for tasks assessed                                  SENSATION: WFL   POSTURE: Forward head, rounded shoulders, L upper trap elevation       UPPER EXTREMITY ROM:   Active/passive ROM Right eval Left eval Right 01/24/22 Right 02/02/22  Right 02/14/22 Right/Left 02/24/22   Shoulder flexion 95p! But able to get to 120 with effort 160 144p! 155 mild pain during lowering 156  no pain 164/166 deg   Shoulder abduction 95 p! 140 120 p! 125p! 147 pain free 166/155  Elbow flexion [facility] WFL      Elbow extension WFL WFL       (Blank rows = not tested) Comments: significant UT compensations with active elevation BUE Comments 02/24/22: painless without significant mechanical compensations       UPPER EXTREMITY MMT:   MMT Right eval Left eval Right/Left 02/24/22   Shoulder flexion 3+ 4- 4+/4+  Shoulder internal rotation 4 4 5/5  Shoulder external rotation 4 4 5/5  Elbow flexion 5 5   Elbow extension 5 5    (Blank rows = not tested)                TODAY'S TREATMENT:  [facility] Adult PT Treatment:                                 DATE: 02/24/22 Therapeutic Exercise: Swiss ball at wall push + lift x10 GTB double ER/scaption x10 Horizontal abd x10 Modified plank 2x30sec GTB shoulder extension x15 Significant time spent w/ HEP education and safety w/ exercise at home    [facility] Adult PT Treatment:                                                DATE: 02/14/22 Therapeutic Exercise: Swiss ball at wall push + lift x12 YTB IR iso walkout + eccentric straightening 2x5  BTB row 2x10 Machine press 10# 2x10  Neuromuscular re-ed: Swiss ball perturbation 2x45sec at 120deg B shoulder scaption w RTB around wrist for improved RC activation 2x8 Modified plank at counter 3x30sec   [facility] Adult PT Treatment:                                                DATE: 02/08/22 Therapeutic Exercise: Swiss ball up wall, push + lift x4, modified to remove lift due to mild increase in discomfort that does not improve, x10 with just push RTB IR iso walkouts 2x8 Horizontal abd RTB 2x8  Neuromuscular re-ed: GTB row 2x15 for periscap activation GTB shoulder ext 2x15 lat activation RC perturbations ball at wall, 90 deg elevation 3x30sec     PATIENT EDUCATION: Education details: discharge education, HEP performance, progress with PT thus far, verbally reviewed and performed HEP with cues  for home setup/performance but does not require cues for safe performance on this date. Provided w/ red  and green theraband, education on appropriate use Person educated: Patient Education method: Explanation, Demonstration, Tactile cues, Verbal cues Education comprehension: verbalized understanding, returned demonstration     HOME EXERCISE PROGRAM: Access Code: [email or web address] URL: [email or web address] Date: 02/24/2022 Prepared by: Enis Slipper  Exercises - Standing shoulder flexion wall slides  - 1 x daily - 7 x weekly - 3 sets - 10 reps - Shoulder External Rotation and Scapular Retraction with Resistance  - 3-4 x weekly - 3 sets - 10 reps - Seated Shoulder Horizontal Abduction with Resistance  - 3-4 x weekly - 3 sets - 10 reps - Plank on Counter  - 3-4 x weekly - 3 sets - 30sec hold - Shoulder External Rotation with Anchored Resistance  - 3-4 x weekly - 3 sets - 5 reps - Shoulder Internal Rotation with Resistance  - 3-4 x weekly - 3 sets - 5 reps   ASSESSMENT:   CLINICAL IMPRESSION: Pt is a very pleasant 66 y.o woman who has been seen by PT for treatment of R shoulder pain ongoing for about 2 years. Pt states that since beginning therapy she no longer has difficulty with any daily activities due to her shoulder pain and has noticed significant improvement in household and self care tasks. Pt has also improved FOTO score to 63, which is above her initial predicted score of 62. Upon assessment today, pt demonstrates significant improvements in [place] AROM and strength testing compared to initial examination and is now grossly [facility]. With lifting assessment pt is able to perform multiple repetitions of 10# KB pick ups unilaterally (with each arm) without any reported difficulty or pain. All goals met as noted below. Pt is able to independently perform HEP exercises on this date without issue, education provided on appropriate home setup/performance. Pt denies any increase in pain as session  goes on, verbalizes good understanding and is agreeable with discharge plan. Pt departs today's session in no acute distress, all voiced questions/concerns addressed appropriately from PT perspective.     OBJECTIVE IMPAIRMENTS: no significant objective impairments identified on discharge   ACTIVITY LIMITATIONS: on discharge, pt denies any significant limitations due to shoulder   PARTICIPATION LIMITATIONS: on discharge, pt denies any significant limitations due to shoulder   PERSONAL FACTORS Time since onset of injury/illness/exacerbation are also affecting patient's functional outcome.    REHAB POTENTIAL: Good   CLINICAL DECISION MAKING: Stable/uncomplicated   EVALUATION COMPLEXITY: Low     GOALS: Goals reviewed with patient? Yes   SHORT TERM GOALS: Target date: 01/30/2022   Pt will demonstrate appropriate understanding and performance of initially prescribed HEP in order to facilitate improved independence with management of symptoms.  Baseline: HEP provided on eval Goal status: MET    2. Pt will score greater than or equal to 53 on FOTO in order to demonstrate improved perception of function due to symptoms.            Baseline: 45  01/31/22: 55            Goal status: MET   LONG TERM GOALS: Target date: 02/20/2022   Pt will score 62 on FOTO in order to demonstrate improved perception of function d/t neck/shoulder pain. Baseline: 45 01/31/22: 55 02/24/22: 63  Goal status: MET   2.  Pt will demonstrate at least 140 degrees of active shoulder elevation in order to demonstrate improved tolerance to functional movement patterns such as reaching overhead for dishes. Baseline: 120 flexion, 95 deg abduction on R 02/24/22: see ROM chart  above, >160 deg shoulder flex AROM B, >150 deg shoulder abd AROM B Goal status: MET   3.  Pt will demonstrate at least 4+/5 R shoulder flex for improved symmetry of UE strength and improved tolerance to functional movements.  Baseline: see MMT  chart above 02/24/22: 4+/5 shoulder flexion bilaterally without pain Goal status: MET   4. Pt will report ability to perform typical household activity such as lifting 10# with less than 2 point increase in shoulder pain on NPS in order to demonstrate improved functional tolerance to daily activities such as laundry.              Baseline: NT due to pain  02/24/22: able to lift 10# KB without pain, unilat UE, minimal exertion reported with multiple reps            Goal status: MET       PLAN: PT FREQUENCY: d/c   PT DURATION: d/c   PLANNED INTERVENTIONS: Therapeutic exercises, Therapeutic activity, Neuromuscular re-education, Balance training, Gait training, Patient/Family education, Self Care, Joint mobilization, Joint manipulation, Aquatic Therapy, Dry Needling, Electrical stimulation, Spinal manipulation, Spinal mobilization, Cryotherapy, Moist heat, Manual therapy, and Re-evaluation      Leeroy Cha PT, DPT 02/24/2022 12:17 PM

## 2022-02-24 ENCOUNTER — Ambulatory Visit: Payer: Medicare Other | Admitting: Physical Therapy

## 2022-02-24 ENCOUNTER — Encounter: Payer: Self-pay | Admitting: Physical Therapy

## 2022-02-24 DIAGNOSIS — M25611 Stiffness of right shoulder, not elsewhere classified: Secondary | ICD-10-CM

## 2022-02-24 DIAGNOSIS — M25511 Pain in right shoulder: Secondary | ICD-10-CM | POA: Diagnosis not present

## 2022-02-24 DIAGNOSIS — M6281 Muscle weakness (generalized): Secondary | ICD-10-CM

## 2022-02-24 DIAGNOSIS — G8929 Other chronic pain: Secondary | ICD-10-CM

## 2022-03-06 IMAGING — MR MR ABDOMEN WO/W CM
18 series · 48 of 48 positions shown · IV contrast (gadavist)
Comparison: 10/21/2019 CT

CLINICAL DATA: Cirrhosis.  Elevated AFP.  Ascites.

EXAM:
MRI ABDOMEN WITHOUT AND WITH CONTRAST
TECHNIQUE: Multiplanar multisequence MR imaging of the abdomen was performed
both before and after the administration of intravenous contrast.
CONTRAST:  6mL GADAVIST GADOBUTROL 1 MMOL/ML IV SOLN

[Series 3: T2 · coronal · 6.0mm · 1.56mm/px · 2 of 32 slices shown (1 of 2)]
[im 1/32]
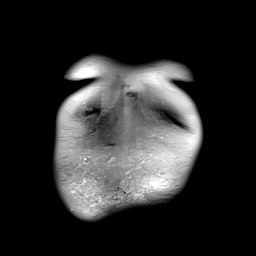
[im 32/32]
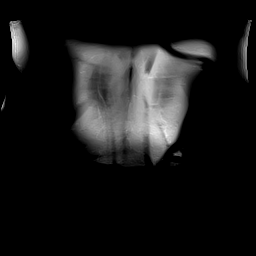

[Series 6: T1 · axial · 3.0mm · 1.25mm/px · z∈[-148,+137]mm · 4 of 96 slices shown (1 of 2)]
[im 1/96]
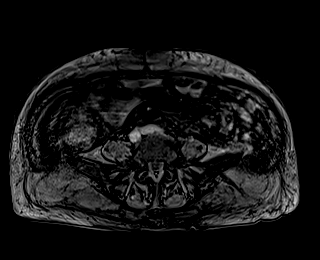
[im 32/96]
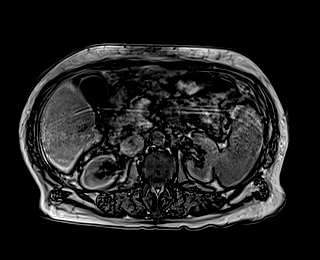
[im 64/96]
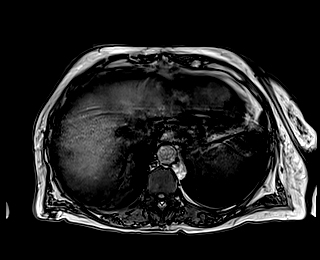
[im 96/96]
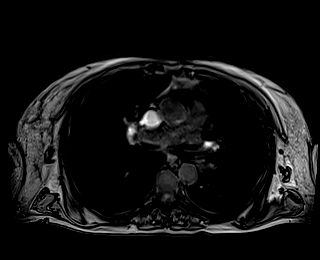

[Series 7: T1 · axial · 3.0mm · 1.25mm/px · z∈[-148,+137]mm · 4 of 96 slices shown (2 of 2)]
[im 1/96]
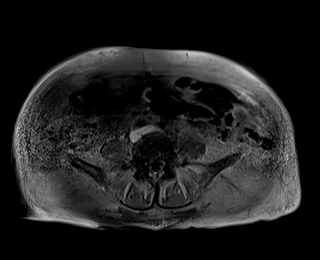
[im 32/96]
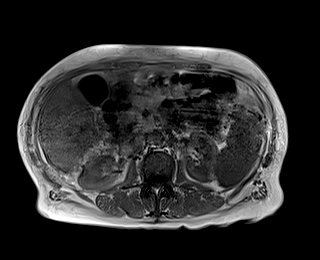
[im 64/96]
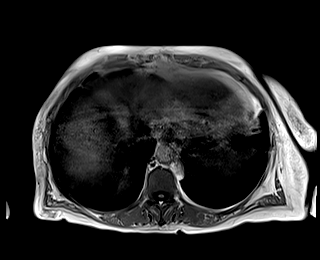
[im 96/96]
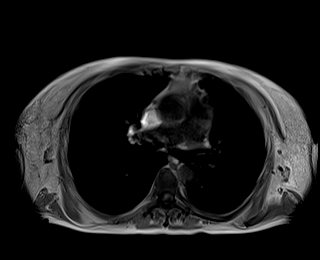

[Series 8: DWI · axial · 6.0mm · 1.49mm/px · z∈[-161,+149]mm · 4 of 88 slices shown (1 of 2)]
[im 1/88]
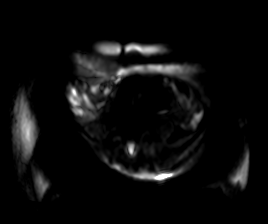
[im 30/88]
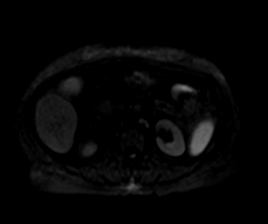
[im 59/88]
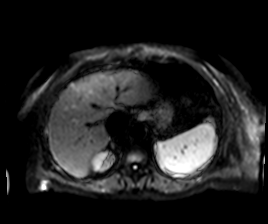
[im 88/88]
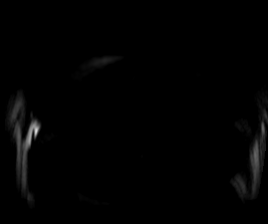

[Series 9: DWI · axial · 6.0mm · 1.49mm/px · 1 of 44 slices shown (2 of 2)]
[im 1/44]
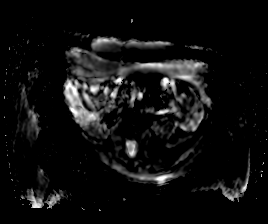

[Series 10: T2 fat-sat · axial · 6.0mm · 1.31mm/px · 1 of 42 slices shown]
[im 1/42]
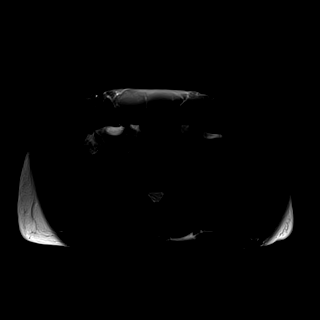

[Series 12: bSSFP · axial · 4.0mm · 0.84mm/px · z∈[-148,+136]mm · 2 of 72 slices shown]
[im 1/72]
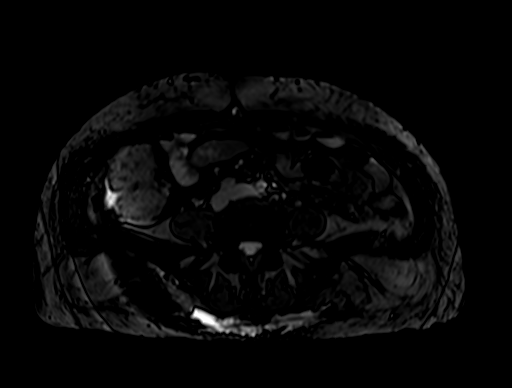
[im 72/72]
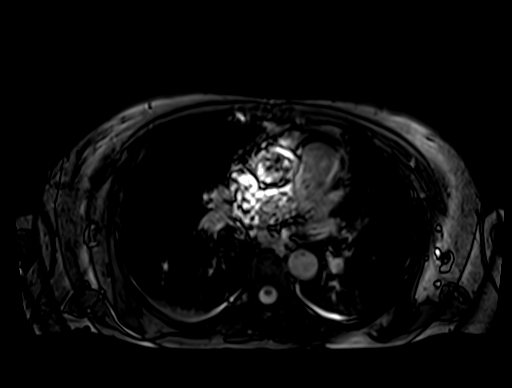

[Series 14: T1 dynamic · axial · 3.0mm · 1.25mm/px · z∈[-148,+137]mm · 3 of 96 slices shown (1 of 6)]
[im 1/96]
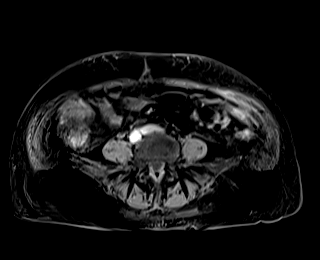
[im 48/96]
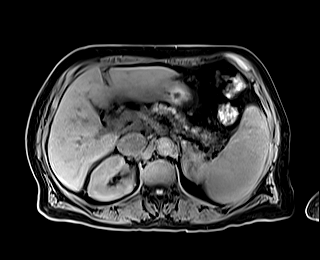
[im 96/96]
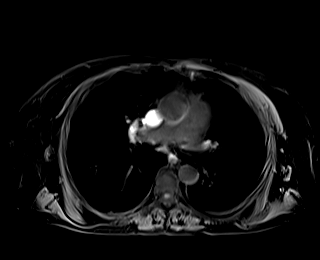

[Series 17: T1 dynamic · axial · 3.0mm · 1.25mm/px · z∈[-148,+137]mm · 3 of 96 slices shown (2 of 6)]
[im 1/96]
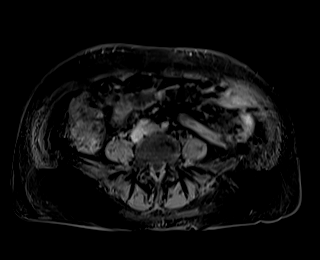
[im 48/96]
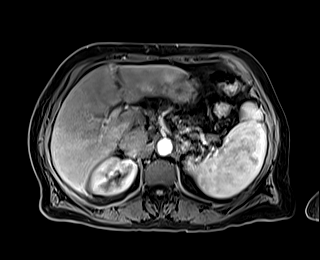
[im 96/96]
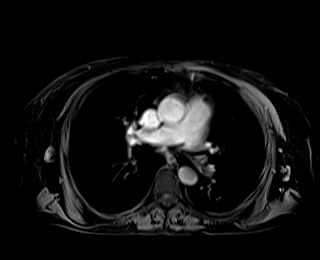

[Series 19: T1 dynamic · axial · 3.0mm · 1.25mm/px · z∈[-148,+137]mm · 3 of 96 slices shown (3 of 6)]
[im 1/96]
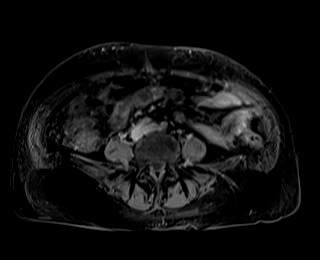
[im 48/96]
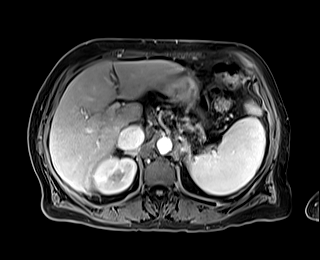
[im 96/96]
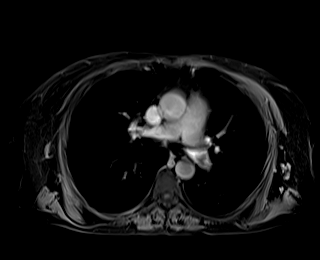

[Series 21: T1 dynamic · axial · 3.0mm · 1.25mm/px · z∈[-148,+137]mm · 3 of 96 slices shown (4 of 6)]
[im 1/96]
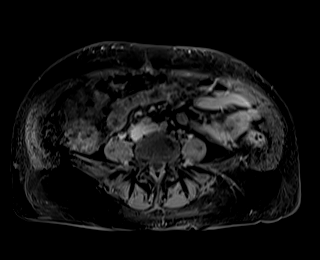
[im 48/96]
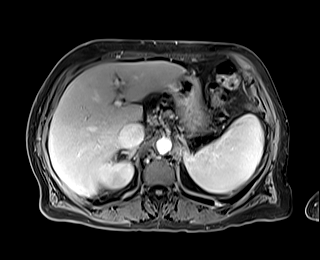
[im 96/96]
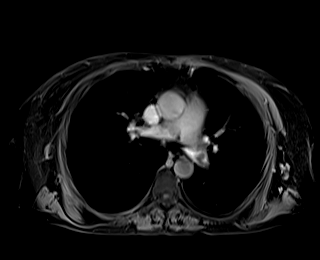

[Series 23: T1 dynamic · coronal · 4.0mm · 1.41mm/px · 2 of 64 slices shown (5 of 6)]
[im 1/64]
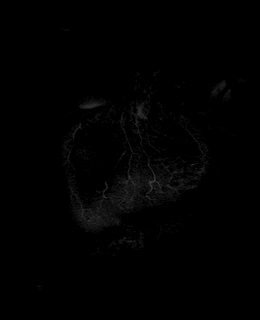
[im 64/64]
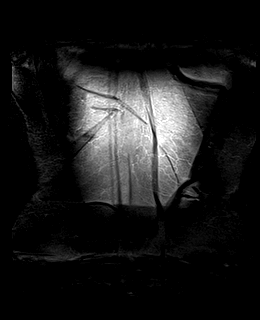

[Series 24: T2 · axial · 6.0mm · 1.56mm/px · 1 of 42 slices shown (2 of 2)]
[im 1/42]
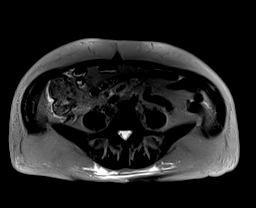

[Series 26: T1 dynamic · axial · 3.0mm · 1.25mm/px · z∈[-148,+137]mm · 3 of 96 slices shown (6 of 6)]
[im 1/96]
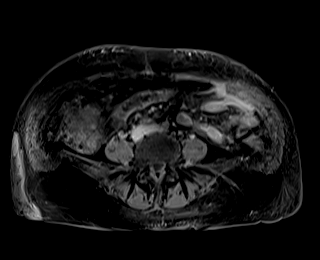
[im 48/96]
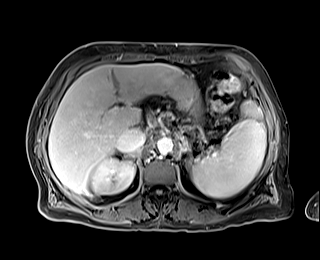
[im 96/96]
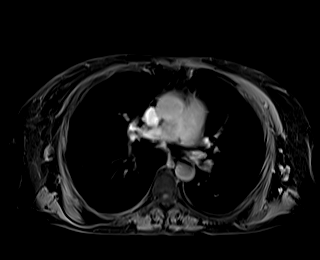

[Series 100: sub_arterial · axial · 3.0mm · 1.25mm/px · z∈[-148,+137]mm · 3 of 96 slices shown]
[im 1/96]
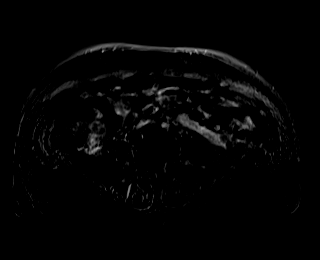
[im 48/96]
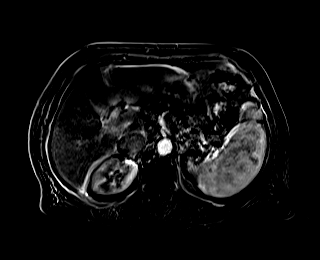
[im 96/96]
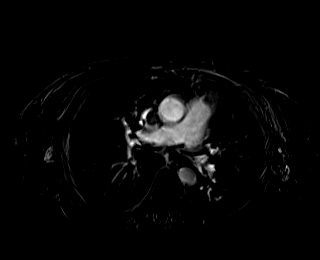

[Series 101: sub_(id) · axial · 3.0mm · 1.25mm/px · z∈[-148,+137]mm · 3 of 96 slices shown (1 of 2)]
[im 1/96]
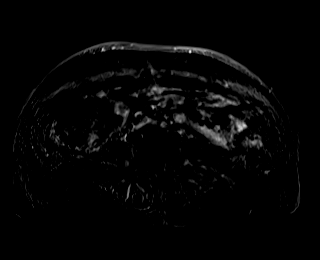
[im 48/96]
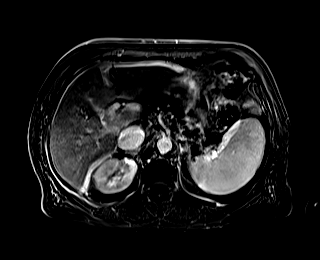
[im 96/96]
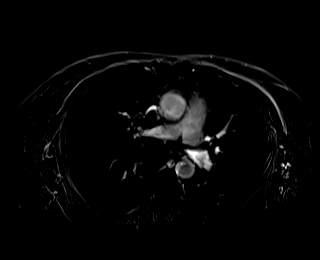

[Series 102: sub_(id) · axial · 3.0mm · 1.25mm/px · z∈[-148,+137]mm · 3 of 96 slices shown (2 of 2)]
[im 1/96]
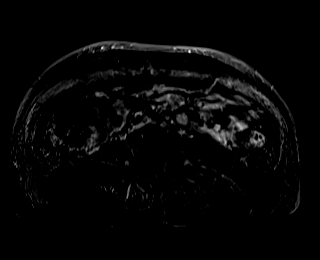
[im 48/96]
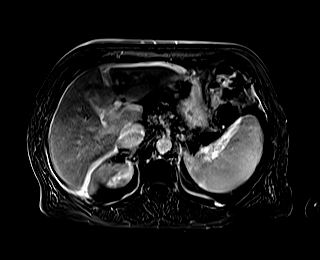
[im 96/96]
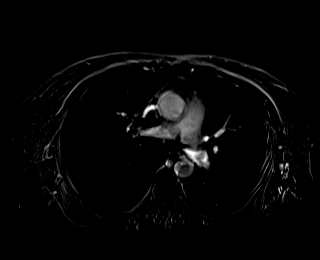

[Series 103: sub_delay · axial · 3.0mm · 1.25mm/px · z∈[-148,+137]mm · 3 of 96 slices shown]
[im 1/96]
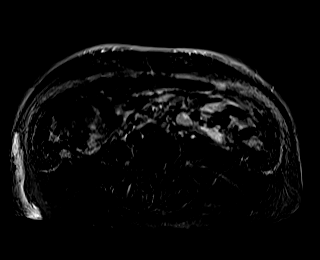
[im 48/96]
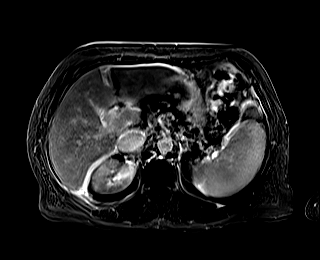
[im 96/96]
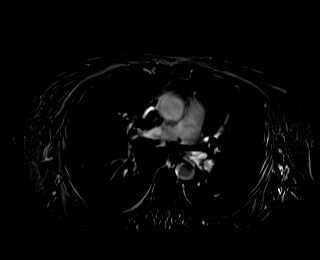

[48 of 48 positions shown; findings below may reference images not displayed]

FINDINGS: Lower chest: Mild cardiomegaly, without pericardial or pleural
effusion.

Hepatobiliary: Moderate cirrhosis.  No suspicious liver lesion.

Normal gallbladder, without biliary ductal dilatation.

Pancreas:  Normal, without mass or ductal dilatation.

Spleen:  Splenomegaly, 14.2 cm craniocaudal.

Adrenals/Urinary Tract: Normal adrenal glands. Normal kidneys,
without hydronephrosis.

Stomach/Bowel: Proximal gastric underdistention. Normal abdominal
bowel loops.

Vascular/Lymphatic: Separate origins of the common hepatic and
splenic arteries. Aortic atherosclerosis. Portosystemic collaterals,
including within the right-side of the abdomen and a recannulized
paraumbilical vein. Prominent abdominal retroperitoneal nodes are
likely reactive and not pathologic by size criteria.

Other:  Trace perihepatic and perisplenic ascites.

Musculoskeletal: T2 hyperintense sacral and lower thoracic vertebral
body lesions are likely hemangiomas.
IMPRESSION: 1. Cirrhosis and portal venous hypertension, without evidence of
hepatocellular carcinoma.
2. Trace abdominal ascites.
3.  Aortic Atherosclerosis ([id number].N).

## 2022-03-13 ENCOUNTER — Encounter: Payer: Self-pay | Admitting: Family Medicine

## 2022-03-13 ENCOUNTER — Ambulatory Visit: Payer: Medicare Other | Attending: Family Medicine | Admitting: Family Medicine

## 2022-03-13 VITALS — BP 141/81 | HR 94 | Ht 67.0 in | Wt 261.0 lb

## 2022-03-13 DIAGNOSIS — M25551 Pain in right hip: Secondary | ICD-10-CM

## 2022-03-13 DIAGNOSIS — M7502 Adhesive capsulitis of left shoulder: Secondary | ICD-10-CM

## 2022-03-13 DIAGNOSIS — I10 Essential (primary) hypertension: Secondary | ICD-10-CM | POA: Diagnosis not present

## 2022-03-13 DIAGNOSIS — K7030 Alcoholic cirrhosis of liver without ascites: Secondary | ICD-10-CM

## 2022-03-13 DIAGNOSIS — M7501 Adhesive capsulitis of right shoulder: Secondary | ICD-10-CM

## 2022-03-13 DIAGNOSIS — R600 Localized edema: Secondary | ICD-10-CM

## 2022-03-13 DIAGNOSIS — R609 Edema, unspecified: Secondary | ICD-10-CM | POA: Diagnosis not present

## 2022-03-13 DIAGNOSIS — M62838 Other muscle spasm: Secondary | ICD-10-CM

## 2022-03-13 MED ORDER — LISINOPRIL 2.5 MG PO TABS: 2.5000 mg | ORAL_TABLET | Freq: Every day | ORAL | 3 refills | Status: DC

## 2022-03-13 MED ORDER — MELOXICAM 7.5 MG PO TABS: 7.5000 mg | ORAL_TABLET | Freq: Every day | ORAL | 1 refills | Status: DC

## 2022-03-13 MED ORDER — CYCLOBENZAPRINE HCL 10 MG PO TABS: 10.0000 mg | ORAL_TABLET | Freq: Every day | ORAL | 3 refills | Status: DC

## 2022-03-13 NOTE — Progress Notes (Signed)
Subjective:  Patient ID: Shirley Mays, female    DOB: 1955-05-07  Age: 66 y.o. MRN: [id number]  CC: Leg Swelling and Leg Pain   HPI Shirley Mays is a 67 y.o. year old female with a history of alcohol abuse, alcoholic cirrhosis of the liver with ascites here for follow-up visit.   Interval History:   Her right shoulder had been hurting x1 year and was seen by Ortho and she declined surgery but is undergoing Physical Therapy has been beneficial. She has pain in her legs and ankle swelling and she finds it difficult to lift her right lower extremity. Pain occurs in both thighs only when she touches it or moves and it feels deep in her bones with associated right foot numbness.  She also points to her anterior right hip joint as a source of pain.  She also has bilateral ankle swelling.  She also notices some muscle spasms when she tries to move her legs.  Currently takes cyclobenzaprine which has been beneficial but naproxen has not been. She has dyspnea while doing house chores but denies presence of orthopnea, PND, chest pain. She has not followed up with GI in a while for management of her liver cirrhosis.  Not adherent with Lisinopril as it made her dizzy and so she discontinued it. Past Medical History:  Diagnosis Date   Alcohol abuse    Cirrhosis ([place])    Hiatal hernia    Pneumonia    Pyelonephritis    Sigmoid diverticulosis     Past Surgical History:  Procedure Laterality Date   IR PARACENTESIS  10/16/2019   IR TRANSCATHETER BX  02/20/2020   IR US GUIDE VASC ACCESS RIGHT  02/20/2020   IR VENOGRAM HEPATIC W HEMODYNAMIC EVALUATION  02/20/2020   TUBAL LIGATION      Family History  Problem Relation Age of Onset   Liver disease Mother    Kidney disease Mother    Heart disease Mother    Cancer Neg Hx    Anemia Neg Hx    Leukemia Neg Hx    Thrombocytopenia Neg Hx     Social History   Socioeconomic History   Marital status: Single    Spouse name: Not on file   Number of  children: Not on file   Years of education: Not on file   Highest education level: Not on file  Occupational History   Not on file  Tobacco Use   Smoking status: Every Day    Packs/day: 0.50    Years: 30.00    Total pack years: 15.00    Types: Cigarettes   Smokeless tobacco: Never   Tobacco comments:    2-3 a day  Vaping Use   Vaping Use: Never used  Substance and Sexual Activity   Alcohol use: Not Currently    Comment: previously drank heavy about 3-6 drinks/day   Drug use: Never   Sexual activity: Not on file  Other Topics Concern   Not on file  Social History Narrative   Not on file   Social Determinants of Health   Financial Resource Strain: Not on file  Food Insecurity: Not on file  Transportation Needs: Not on file  Physical Activity: Not on file  Stress: Not on file  Social Connections: Not on file    No Known Allergies  Outpatient Medications Prior to Visit  Medication Sig Dispense Refill   furosemide (LASIX) 40 MG tablet TAKE ONE TABLET BY MOUTH DAILY 90 tablet 1  Multiple Vitamin (MULTIVITAMIN WITH MINERALS) TABS tablet Take 1 tablet by mouth daily.     cyclobenzaprine (FLEXERIL) 5 MG tablet TAKE 1/2 TO 1 TABLET BY MOUTH UP TO 3 TIMES A DAY FOR MUSCLE SPASMS 30 tablet 1   Blood Pressure Monitoring (BLOOD PRESSURE MON/AUTO/WRIST) DEVI Use to check blood pressure once daily. 1 each 0   diclofenac Sodium (VOLTAREN) 1 % GEL Apply 4 g topically 4 (four) times daily. 100 g 1   thiamine 100 MG tablet Take 1 tablet (100 mg total) by mouth daily. 90 tablet 1   lisinopril (ZESTRIL) 10 MG tablet Take 1 tablet (10 mg total) by mouth daily. 90 tablet 3   naproxen (NAPROSYN) 500 MG tablet Take 1 tablet (500 mg total) by mouth 2 (two) times daily with a meal. Prn pain 60 tablet 1   No facility-administered medications prior to visit.     ROS Review of Systems  Constitutional:  Negative for activity change and appetite change.  HENT:  Negative for sinus pressure and  sore throat.   Respiratory:  Negative for chest tightness, shortness of breath and wheezing.   Cardiovascular:  Positive for leg swelling. Negative for chest pain and palpitations.  Gastrointestinal:  Negative for abdominal distention, abdominal pain and constipation.  Genitourinary: Negative.   Musculoskeletal:        See HPI  Psychiatric/Behavioral:  Negative for behavioral problems and dysphoric mood.     Objective:  BP (!) 141/81 (BP Location: Left Arm, Patient Position: Sitting, Cuff Size: Large)   Pulse 94   Ht 5' 7" (1.702 m)   Wt 261 lb (118.4 kg)   SpO2 95%   BMI 40.88 kg/m      03/13/2022   11:07 AM 01/06/2022   11:37 AM 12/01/2021   10:51 AM  BP/Weight  Systolic BP 141 143 148  Diastolic BP 81 81 78  Wt. (Lbs) 261    BMI 40.88 kg/m2        Physical Exam Constitutional:      Appearance: She is well-developed.  Cardiovascular:     Rate and Rhythm: Normal rate.     Heart sounds: Normal heart sounds. No murmur heard. Pulmonary:     Effort: Pulmonary effort is normal.     Breath sounds: Normal breath sounds. No wheezing or rales.  Chest:     Chest wall: No tenderness.  Abdominal:     General: Bowel sounds are normal. There is no distension.     Palpations: Abdomen is soft. There is no mass.     Tenderness: There is no abdominal tenderness.  Musculoskeletal:        General: Normal range of motion.     Right lower leg: Edema (1+ non pitting) present.     Left lower leg: Edema (1+ non pitting) present.     Comments: Tenderness on deep palpation of right inguinal region and on range of motion of right lower extremity Movement of left lower extremity does not elicit any pain  Neurological:     Mental Status: She is alert and oriented to person, place, and time.  Psychiatric:        Mood and Affect: Mood normal.        Latest Ref Rng & Units 12/01/2021   11:00 AM 07/26/2020   11:41 AM 06/02/2020   12:08 PM  CMP  Glucose 70 - 99 mg/dL 85   86   BUN 8 - 27  mg/dL 11   13     Creatinine 0.57 - 1.00 mg/dL 0.80   0.81   Sodium 134 - 144 mmol/L 141   139   Potassium 3.5 - 5.2 mmol/L 4.1   4.5   Chloride 96 - 106 mmol/L 106   106   CO2 20 - 29 mmol/L 17   20   Calcium 8.7 - 10.3 mg/dL 9.6   9.6   Total Protein 6.0 - 8.5 g/dL 8.5  8.1    Total Bilirubin 0.0 - 1.2 mg/dL 0.6  1.7    Alkaline Phos 44 - 121 IU/L 113  87    AST 0 - 40 IU/L 24  39    ALT 0 - 32 IU/L 11  18      Lipid Panel  No results found for: "CHOL", "TRIG", "HDL", "CHOLHDL", "VLDL", "LDLCALC", "LDLDIRECT"  CBC    Component Value Date/Time   WBC 10.3 12/01/2021 1100   WBC 7.9 07/26/2020 1141   RBC 4.94 12/01/2021 1100   RBC 4.09 07/26/2020 1141   HGB 14.8 12/01/2021 1100   HCT 42.5 12/01/2021 1100   PLT 214 12/01/2021 1100   MCV 86 12/01/2021 1100   MCH 30.0 12/01/2021 1100   MCH 33.9 03/17/2020 1015   MCHC 34.8 12/01/2021 1100   MCHC 34.2 07/26/2020 1141   RDW 12.2 12/01/2021 1100   LYMPHSABS 3.3 (H) 12/01/2021 1100   MONOABS 0.6 07/26/2020 1141   EOSABS 0.2 12/01/2021 1100   BASOSABS 0.1 12/01/2021 1100    No results found for: "HGBA1C"  Assessment & Plan:  1. Pain of right hip Possibly underlying osteoarthritis We will substitute naproxen with meloxicam If symptoms persist consider imaging - meloxicam (MOBIC) 7.5 MG tablet; Take 1 tablet (7.5 mg total) by mouth daily.  Dispense: 30 tablet; Refill: 1  2. Adhesive capsulitis of both shoulders Improving Continue PT Increased dose of Flexeril - cyclobenzaprine (FLEXERIL) 10 MG tablet; Take 1 tablet (10 mg total) by mouth at bedtime.  Dispense: 30 tablet; Refill: 3  3. Essential hypertension Uncontrolled due to medication nonadherence as a result of dizziness We will decrease lisinopril dose from 10 mg to 2.5 mg as she might have been overmedicated Counseled on blood pressure goal of less than 130/80, low-sodium, DASH diet, medication compliance, 150 minutes of moderate intensity exercise per week. Discussed  medication compliance, adverse effects. - lisinopril (ZESTRIL) 2.5 MG tablet; Take 1 tablet (2.5 mg total) by mouth daily.  Dispense: 30 tablet; Refill: 3  4. Alcoholic cirrhosis of liver without ascites ([place]) Stable She has quit alcohol consumption and has been commended Continue with Lasix Currently not under GI care  5. Pedal edema Could be dependent versus due to alcoholic cirrhosis Continue Lasix - Pro b natriuretic peptide - CMP14+EGFR  6. Muscle spasm We will check potassium due to the fact that she is on chronic diuretics Weight loss will be beneficial - cyclobenzaprine (FLEXERIL) 10 MG tablet; Take 1 tablet (10 mg total) by mouth at bedtime.  Dispense: 30 tablet; Refill: 3   Health Care Maintenance: We will address at next visit. Meds ordered this encounter  Medications   meloxicam (MOBIC) 7.5 MG tablet    Sig: Take 1 tablet (7.5 mg total) by mouth daily.    Dispense:  30 tablet    Refill:  1    Discontinue Naproxen   cyclobenzaprine (FLEXERIL) 10 MG tablet    Sig: Take 1 tablet (10 mg total) by mouth at bedtime.    Dispense:  30 tablet    Refill:  3   lisinopril (ZESTRIL) 2.5 MG tablet    Sig: Take 1 tablet (2.5 mg total) by mouth daily.    Dispense:  30 tablet    Refill:  3    Dose decrease    Follow-up: Return in about 1 month (around 04/12/2022) for Medicare wellness exam.       Charlott Rakes, MD, FAAFP. [facility] [place], [place]   03/13/2022, 12:58 PM

## 2022-03-13 NOTE — Patient Instructions (Signed)

## 2022-03-14 LAB — CMP14+EGFR
ALT: 13 IU/L (ref 0–32)
AST: 24 IU/L (ref 0–40)
Albumin/Globulin Ratio: 1.1 — ABNORMAL LOW (ref 1.2–2.2)
Albumin: 4.3 g/dL (ref 3.9–4.9)
Alkaline Phosphatase: 109 IU/L (ref 44–121)
BUN/Creatinine Ratio: 9 — ABNORMAL LOW (ref 12–28)
BUN: 9 mg/dL (ref 8–27)
Bilirubin Total: 0.7 mg/dL (ref 0.0–1.2)
CO2: 23 mmol/L (ref 20–29)
Calcium: 9.9 mg/dL (ref 8.7–10.3)
Chloride: 101 mmol/L (ref 96–106)
Creatinine, Ser: 0.96 mg/dL (ref 0.57–1.00)
Globulin, Total: 3.8 g/dL (ref 1.5–4.5)
Glucose: 92 mg/dL (ref 70–99)
Potassium: 4.6 mmol/L (ref 3.5–5.2)
Sodium: 138 mmol/L (ref 134–144)
Total Protein: 8.1 g/dL (ref 6.0–8.5)
eGFR: 65 mL/min/{1.73_m2} (ref >59)

## 2022-03-14 LAB — PRO B NATRIURETIC PEPTIDE: NT-Pro BNP: 36 pg/mL (ref 0–301)

## 2022-04-03 ENCOUNTER — Ambulatory Visit: Payer: Medicare Other | Admitting: Orthopedic Surgery

## 2022-04-11 ENCOUNTER — Telehealth: Payer: Self-pay | Admitting: *Deleted

## 2022-04-11 NOTE — Telephone Encounter (Signed)
Scheduled an appointment with PCP for 05/18/2021.

## 2022-04-11 NOTE — Telephone Encounter (Signed)
Copied from CRM [id number]. Topic: Appointment Scheduling - Scheduling Inquiry for Clinic >> Apr 11, 2022  1:14 PM De Blanch wrote: Reason for CRM:Pt is requesting to cancel upcoming appointment for tomorrow and is requesting a call back to reschedule sometime in January.  Please advise.

## 2022-04-12 ENCOUNTER — Encounter: Payer: Medicare Other | Admitting: Family Medicine

## 2022-05-18 ENCOUNTER — Ambulatory Visit: Payer: Medicare Other | Admitting: Family Medicine

## 2022-05-24 ENCOUNTER — Emergency Department (HOSPITAL_BASED_OUTPATIENT_CLINIC_OR_DEPARTMENT_OTHER): Payer: Medicare Other | Admitting: Radiology

## 2022-05-24 ENCOUNTER — Emergency Department (HOSPITAL_BASED_OUTPATIENT_CLINIC_OR_DEPARTMENT_OTHER): Payer: Medicare Other

## 2022-05-24 ENCOUNTER — Other Ambulatory Visit: Payer: Self-pay

## 2022-05-24 ENCOUNTER — Encounter (HOSPITAL_BASED_OUTPATIENT_CLINIC_OR_DEPARTMENT_OTHER): Payer: Self-pay | Admitting: *Deleted

## 2022-05-24 ENCOUNTER — Encounter (HOSPITAL_COMMUNITY): Payer: Self-pay

## 2022-05-24 ENCOUNTER — Observation Stay (HOSPITAL_BASED_OUTPATIENT_CLINIC_OR_DEPARTMENT_OTHER)
Admission: EM | Admit: 2022-05-24 | Discharge: 2022-05-25 | Disposition: A | Discharge status: Home / Self Care | Payer: Medicare Other | Attending: Internal Medicine | Admitting: Internal Medicine

## 2022-05-24 DIAGNOSIS — I10 Essential (primary) hypertension: Secondary | ICD-10-CM | POA: Diagnosis not present

## 2022-05-24 DIAGNOSIS — R911 Solitary pulmonary nodule: Secondary | ICD-10-CM | POA: Diagnosis not present

## 2022-05-24 DIAGNOSIS — R0602 Shortness of breath: Secondary | ICD-10-CM | POA: Insufficient documentation

## 2022-05-24 DIAGNOSIS — Z79899 Other long term (current) drug therapy: Secondary | ICD-10-CM | POA: Insufficient documentation

## 2022-05-24 DIAGNOSIS — X58XXXA Exposure to other specified factors, initial encounter: Secondary | ICD-10-CM | POA: Diagnosis not present

## 2022-05-24 DIAGNOSIS — R079 Chest pain, unspecified: Secondary | ICD-10-CM | POA: Diagnosis present

## 2022-05-24 DIAGNOSIS — F17210 Nicotine dependence, cigarettes, uncomplicated: Secondary | ICD-10-CM | POA: Insufficient documentation

## 2022-05-24 DIAGNOSIS — S2231XA Fracture of one rib, right side, initial encounter for closed fracture: Secondary | ICD-10-CM | POA: Diagnosis not present

## 2022-05-24 DIAGNOSIS — J189 Pneumonia, unspecified organism: Secondary | ICD-10-CM | POA: Diagnosis not present

## 2022-05-24 DIAGNOSIS — J9811 Atelectasis: Secondary | ICD-10-CM | POA: Insufficient documentation

## 2022-05-24 DIAGNOSIS — K7031 Alcoholic cirrhosis of liver with ascites: Secondary | ICD-10-CM

## 2022-05-24 DIAGNOSIS — Z1152 Encounter for screening for COVID-19: Secondary | ICD-10-CM | POA: Diagnosis not present

## 2022-05-24 DIAGNOSIS — S2239XA Fracture of one rib, unspecified side, initial encounter for closed fracture: Secondary | ICD-10-CM | POA: Diagnosis present

## 2022-05-24 DIAGNOSIS — K7460 Unspecified cirrhosis of liver: Secondary | ICD-10-CM | POA: Diagnosis present

## 2022-05-24 LAB — COMPREHENSIVE METABOLIC PANEL
ALT: 13 U/L (ref 0–44)
AST: 21 U/L (ref 15–41)
Albumin: 4.3 g/dL (ref 3.5–5.0)
Alkaline Phosphatase: 66 U/L (ref 38–126)
Anion gap: 10 (ref 5–15)
BUN: 14 mg/dL (ref 8–23)
CO2: 25 mmol/L (ref 22–32)
Calcium: 10.2 mg/dL (ref 8.9–10.3)
Chloride: 103 mmol/L (ref 98–111)
Creatinine, Ser: 0.85 mg/dL (ref 0.44–1.00)
GFR, Estimated: >60 mL/min (ref >60)
Glucose, Bld: 95 mg/dL (ref 70–99)
Potassium: 4.4 mmol/L (ref 3.5–5.1)
Sodium: 138 mmol/L (ref 135–145)
Total Bilirubin: 1.4 mg/dL — ABNORMAL HIGH (ref 0.3–1.2)
Total Protein: 9 g/dL — ABNORMAL HIGH (ref 6.5–8.1)

## 2022-05-24 LAB — CBC WITH DIFFERENTIAL/PLATELET
Abs Immature Granulocytes: 0.03 10*3/uL (ref 0.00–0.07)
Basophils Absolute: 0.1 10*3/uL (ref 0.0–0.1)
Basophils Relative: 0 %
Eosinophils Absolute: 0.1 10*3/uL (ref 0.0–0.5)
Eosinophils Relative: 1 %
HCT: 42.4 % (ref 36.0–46.0)
Hemoglobin: 13.9 g/dL (ref 12.0–15.0)
Immature Granulocytes: 0 %
Lymphocytes Relative: 31 %
Lymphs Abs: 3.9 10*3/uL (ref 0.7–4.0)
MCH: 29.3 pg (ref 26.0–34.0)
MCHC: 32.8 g/dL (ref 30.0–36.0)
MCV: 89.3 fL (ref 80.0–100.0)
Monocytes Absolute: 1 10*3/uL (ref 0.1–1.0)
Monocytes Relative: 8 %
Neutro Abs: 7.4 10*3/uL (ref 1.7–7.7)
Neutrophils Relative %: 60 %
Platelets: 274 10*3/uL (ref 150–400)
RBC: 4.75 MIL/uL (ref 3.87–5.11)
RDW: 13.6 % (ref 11.5–15.5)
WBC: 12.4 10*3/uL — ABNORMAL HIGH (ref 4.0–10.5)
nRBC: 0 % (ref 0.0–0.2)

## 2022-05-24 LAB — PROCALCITONIN: Procalcitonin: <0.1 ng/mL

## 2022-05-24 LAB — URINALYSIS, ROUTINE W REFLEX MICROSCOPIC
Bilirubin Urine: NEGATIVE
Glucose, UA: NEGATIVE mg/dL
Hgb urine dipstick: NEGATIVE
Ketones, ur: NEGATIVE mg/dL
Leukocytes,Ua: NEGATIVE
Nitrite: NEGATIVE
Protein, ur: NEGATIVE mg/dL
Specific Gravity, Urine: 1.017 (ref 1.005–1.030)
pH: 5 (ref 5.0–8.0)

## 2022-05-24 LAB — LACTIC ACID, PLASMA
Lactic Acid, Venous: 1.8 mmol/L (ref 0.5–1.9)
Lactic Acid, Venous: 0.8 mmol/L (ref 0.5–1.9)

## 2022-05-24 LAB — RESP PANEL BY RT-PCR (RSV, FLU A&B, COVID)  RVPGX2
Influenza A by PCR: NEGATIVE
Influenza B by PCR: NEGATIVE
Resp Syncytial Virus by PCR: NEGATIVE
SARS Coronavirus 2 by RT PCR: NEGATIVE

## 2022-05-24 LAB — TROPONIN I (HIGH SENSITIVITY)
Troponin I (High Sensitivity): 2 ng/L (ref <18)
Troponin I (High Sensitivity): 2 ng/L (ref <18)

## 2022-05-24 LAB — LIPASE, BLOOD: Lipase: 27 U/L (ref 11–51)

## 2022-05-24 LAB — BRAIN NATRIURETIC PEPTIDE: B Natriuretic Peptide: 11.3 pg/mL (ref 0.0–100.0)

## 2022-05-24 MED ORDER — SODIUM CHLORIDE 0.9 % IV SOLN
500.0000 mg | Freq: Once | INTRAVENOUS | Status: AC
  Administered 2022-05-24: 500 mg via INTRAVENOUS
  Filled 2022-05-24: qty 5

## 2022-05-24 MED ORDER — FENTANYL CITRATE PF 50 MCG/ML IJ SOSY
50.0000 ug | PREFILLED_SYRINGE | Freq: Once | INTRAMUSCULAR | Status: AC
  Administered 2022-05-24: 50 ug via INTRAVENOUS
  Filled 2022-05-24: qty 1

## 2022-05-24 MED ORDER — SODIUM CHLORIDE 0.9 % IV SOLN
1.0000 g | Freq: Once | INTRAVENOUS | Status: AC
  Administered 2022-05-24: 1 g via INTRAVENOUS
  Filled 2022-05-24: qty 10

## 2022-05-24 MED ORDER — IOHEXOL 350 MG/ML SOLN
100.0000 mL | Freq: Once | INTRAVENOUS | Status: AC | PRN
  Administered 2022-05-24: 75 mL via INTRAVENOUS

## 2022-05-24 MED ORDER — HYDROMORPHONE HCL 1 MG/ML IJ SOLN
0.5000 mg | INTRAMUSCULAR | Status: DC | PRN
  Administered 2022-05-24: 0.5 mg via INTRAVENOUS
  Filled 2022-05-24: qty 1

## 2022-05-24 MED ORDER — ONDANSETRON HCL 4 MG/2ML IJ SOLN
4.0000 mg | Freq: Once | INTRAMUSCULAR | Status: AC
  Administered 2022-05-24: 4 mg via INTRAVENOUS
  Filled 2022-05-24: qty 2

## 2022-05-24 MED ORDER — METOCLOPRAMIDE HCL 5 MG/ML IJ SOLN
10.0000 mg | Freq: Once | INTRAMUSCULAR | Status: AC
  Administered 2022-05-24: 10 mg via INTRAVENOUS
  Filled 2022-05-24: qty 2

## 2022-05-24 MED ORDER — SODIUM CHLORIDE 0.9 % IV SOLN: INTRAVENOUS | Status: DC | PRN

## 2022-05-24 NOTE — ED Notes (Addendum)
Pt just provided broth and soda crackers - per request; pt now sitting up to edge of bed preparing to eat

## 2022-05-24 NOTE — Hospital Course (Signed)
67 year old female with history of liver cirrhosis with ascites, previous pneumonia/previous pyelonephritis recently diagnosed with influenza coming in with right-sided chest pain/pleuritic pain, chills fatigue chest tightness, back pain.  She was given Diflucan and also Cipro by her PCP given her liver disease despite that she has had worsening her pain, with pleuritic discomfort.  EKG no acute ST elevation MI.  In the ED negative for COVID flu RSV troponin negative x 2 UA normal BNP normal lactic acid normal labs with leukocytosis x-ray with acute rib fracture, oxygen saturation 92%, underwent CT PE due to ongoing pain, no PE, showed complete atelectasis of RML, multifocal areas of opacification and high-grade narrowing of multiple lobar and more distal branches of right middle lobe airways may be secondary to mucous plugging difficult to exclude obstructing endobronchial lesion, 10 mm nodule on right lower lobe needing 3 months follow-up, mildly displaced acute fracture of the posterior right seventh rib. Started on ceftriaxone azithromycin. Admission requested for further management. Coughing for few weeks>felts crack eysteray> now has broken rib> now w/ atelectasis/suspecting pneumonia.? Underlying pathology> asked ed to check Procal. Will need Pulm eval.

## 2022-05-24 NOTE — ED Triage Notes (Signed)
BIB family from home for rib pain s/p cough and sneeze concurrently yesterday. Cough is productive/clear. States, "feel like I broke a rib". Pinpoints pain to R lateral rib and across R mid back. Endorses seen by PCP Kevan Ny dx positive flu, negative covid, taking cipro and tamiflu. Alert, NAD, calm, interactive.

## 2022-05-24 NOTE — ED Notes (Addendum)
Pt up oob to hall bathroom independently; gait steady.  Awaiting [facility]

## 2022-05-24 NOTE — ED Notes (Signed)
Patient transported to CT 

## 2022-05-24 NOTE — ED Notes (Signed)
Patients pulse drops into mid - upper 80's with falling asleep  Placed on O2 at 2 L via [place]

## 2022-05-24 NOTE — Progress Notes (Signed)
RT ambulated the patient to check he O2 SAT. O2 SAT was 92% sitting and standing. The Pt's SATS were 93% while walking.T will continue to monitor

## 2022-05-24 NOTE — ED Notes (Signed)
Sitting upright on side of bed, alert, NAD, calm, interactive. States, "Feel better".

## 2022-05-24 NOTE — ED Provider Notes (Signed)
EMERGENCY DEPARTMENT AT [facility] Provider Note   CSN: [id number] Arrival date & time: 05/24/22  1696     History  No chief complaint on file.   Shirley Mays is a 67 y.o. female.  The history is provided by the patient and medical records. No language interpreter was used.  Chest Pain Pain location:  R lateral chest and R chest Pain quality: aching and sharp   Pain radiates to:  Does not radiate Pain severity:  Severe Onset quality:  Sudden Duration:  2 days Timing:  Constant Progression:  Unchanged Chronicity:  New Relieved by:  Nothing Worsened by:  Coughing, deep breathing and certain positions Ineffective treatments:  None tried Associated symptoms: back pain, cough, fatigue and shortness of breath   Associated symptoms: no abdominal pain, no altered mental status, no diaphoresis, no fever, no headache, no nausea, no palpitations and no vomiting        Home Medications Prior to Admission medications   Medication Sig Start Date End Date Taking? Authorizing Provider  Blood Pressure Monitoring (BLOOD PRESSURE MON/AUTO/WRIST) DEVI Use to check blood pressure once daily. 01/06/22   Hoy Register, MD  cyclobenzaprine (FLEXERIL) 10 MG tablet Take 1 tablet (10 mg total) by mouth at bedtime. 03/13/22   Hoy Register, MD  diclofenac Sodium (VOLTAREN) 1 % GEL Apply 4 g topically 4 (four) times daily. 06/02/20   Hoy Register, MD  furosemide (LASIX) 40 MG tablet TAKE ONE TABLET BY MOUTH DAILY 12/23/21   Hoy Register, MD  lisinopril (ZESTRIL) 2.5 MG tablet Take 1 tablet (2.5 mg total) by mouth daily. 03/13/22   Hoy Register, MD  meloxicam (MOBIC) 7.5 MG tablet Take 1 tablet (7.5 mg total) by mouth daily. 03/13/22   Hoy Register, MD  Multiple Vitamin (MULTIVITAMIN WITH MINERALS) TABS tablet Take 1 tablet by mouth daily. 10/24/19   Almon Hercules, MD  thiamine 100 MG tablet Take 1 tablet (100 mg total) by mouth daily. 10/24/19   Almon Hercules, MD       Allergies    Patient has no known allergies.    Review of Systems   Review of Systems  Constitutional:  Positive for chills and fatigue. Negative for diaphoresis and fever.  HENT:  Negative for congestion.   Respiratory:  Positive for cough, chest tightness and shortness of breath. Negative for wheezing.   Cardiovascular:  Positive for chest pain. Negative for palpitations and leg swelling.  Gastrointestinal:  Negative for abdominal pain, constipation, diarrhea, nausea and vomiting.  Genitourinary:  Negative for dysuria and flank pain.  Musculoskeletal:  Positive for back pain. Negative for neck pain and neck stiffness.  Skin:  Negative for rash and wound.  Neurological:  Negative for light-headedness and headaches.  Psychiatric/Behavioral:  Negative for agitation and confusion.   All other systems reviewed and are negative.   Physical Exam Updated Vital Signs BP (!) 137/98 (BP Location: Right Arm)   Pulse 92   Temp 97.9 F (36.6 C)   Resp (!) 21   SpO2 100%  Physical Exam Vitals and nursing note reviewed.  Constitutional:      General: She is not in acute distress.    Appearance: She is well-developed. She is ill-appearing. She is not toxic-appearing or diaphoretic.  HENT:     Head: Normocephalic and atraumatic.     Nose: Nose normal.  Eyes:     Conjunctiva/sclera: Conjunctivae normal.  Cardiovascular:     Rate and Rhythm: Normal rate and regular rhythm.  Heart sounds: No murmur heard. Pulmonary:     Breath sounds: Rhonchi present. No wheezing.  Chest:     Chest wall: Tenderness present.  Abdominal:     General: Abdomen is flat.     Palpations: Abdomen is soft.     Tenderness: There is no abdominal tenderness. There is no guarding or rebound.  Musculoskeletal:        General: Tenderness present. No swelling.     Cervical back: Neck supple. No tenderness.     Right lower leg: No edema.     Left lower leg: No edema.  Skin:    General: Skin is warm and dry.      Capillary Refill: Capillary refill takes less than 2 seconds.     Findings: No erythema or rash.  Neurological:     General: No focal deficit present.     Mental Status: She is alert.  Psychiatric:        Mood and Affect: Mood normal.     ED Results / Procedures / Treatments   Labs (all labs ordered are listed, but only abnormal results are displayed) Labs Reviewed  CBC WITH DIFFERENTIAL/PLATELET - Abnormal; Notable for the following components:      Result Value   WBC 12.4 (*)    All other components within normal limits  COMPREHENSIVE METABOLIC PANEL - Abnormal; Notable for the following components:   Total Protein 9.0 (*)    Total Bilirubin 1.4 (*)    All other components within normal limits  RESP PANEL BY RT-PCR (RSV, FLU A&B, COVID)  [id number]  URINE CULTURE  LIPASE, BLOOD  BRAIN NATRIURETIC PEPTIDE  LACTIC ACID, PLASMA  LACTIC ACID, PLASMA  URINALYSIS, ROUTINE W REFLEX MICROSCOPIC  TROPONIN I (HIGH SENSITIVITY)  TROPONIN I (HIGH SENSITIVITY)    EKG EKG Interpretation  Date/Time:  Wednesday May 24 2022 09:17:27 EST Ventricular Rate:  83 PR Interval:  150 QRS Duration: 95 QT Interval:  397 QTC Calculation: 467 R Axis:   27 Text Interpretation: Sinus rhythm when compared to prior, similar appearance with slower rate. No STEMI Confirmed by Antony Blackbird [phone] on 05/24/2022 9:30:55 AM  Radiology CT Angio Chest PE W and/or Wo Contrast  Result Date: 05/24/2022 CLINICAL DATA:  Pulmonary embolism suspected. High probability rib fracture. Evaluate for pneumonia, pulmonary embolism, or pneumothorax. Rib pain after coughing and sneezing yesterday. Patient states "I feel like I broke a rib." Pinpoints pain to right lateral rib and across right mid back. Recently diagnosed by PCP with flu. EXAM: CT ANGIOGRAPHY CHEST WITH CONTRAST TECHNIQUE: Multidetector CT imaging of the chest was performed using the standard protocol during bolus administration of intravenous  contrast. Multiplanar CT image reconstructions and MIPs were obtained to evaluate the vascular anatomy. RADIATION DOSE REDUCTION: This exam was performed according to the departmental dose-optimization program which includes automated exposure control, adjustment of the mA and/or kV according to patient size and/or use of iterative reconstruction technique. CONTRAST:  58mL OMNIPAQUE IOHEXOL 350 MG/ML SOLN COMPARISON:  PA chest and bilateral rib radiographs 05/24/2022, PA chest and right rib radiographs 01/02/2020; CT abdomen and pelvis 10/21/2019 FINDINGS: Cardiovascular: The main pulmonary artery is opacified up to 191 Hounsfield units. No filling defect is seen to indicate an acute pulmonary embolism. Evaluation of the distal segmental and more distal vessels is limited by contrast bolus timing. Heart size is mildly enlarged. No pericardial effusion. There is a normal variant aberrant origin of the right subclavian artery, originating from the distal aspect of the  aortic arch and coursing posterior to the trachea and esophagus (axial series 4, image 29). No thoracic aortic aneurysm. Mild atherosclerotic calcifications within the thoracic aorta. Mediastinum/Nodes: No axillary, pathologically enlarged lymph nodes by CT criteria. There is a 1.6 cm short axis right paratracheal lymph node (axial series 4, image 44), possibly reactive. Smaller but still enlarged more inferior pretracheal and right peribronchial lymph nodes are also noted. The thyroid is unremarkable. The the esophagus follows a normal course of normal caliber. Lungs/Pleura: There is complete atelectasis of the right middle lobe, with multifocal areas of opacification and high-grade narrowing of multiple lobar and more distal branches of the right middle lobe airways. Moderate right and mild left superior paraseptal emphysematous changes. Small right pleural effusion. There is a 10 x 9 x 9 mm (transverse by craniocaudal, axial series 6, image 62 and  sagittal series 8, image 57) nodule within the posterosuperior lateral aspect of the right lower lobe. This region likely was not included on prior 10/21/2019 CT abdomen and pelvis. There is atelectasis of the posteroinferior lateral lingula and curvilinear atelectasis within adjacent portions of the inferior aspect of the left lower lobe. No left pleural effusion. Upper Abdomen: Mildly nodular liver contour is unchanged, again compatible with cirrhosis. No upper abdominal ascites is seen, and there appears to be resolution of upper abdominal ascites seen on prior 10/21/2019 CT. Musculoskeletal: Mild-to-moderate multilevel degenerative disc changes of the thoracic spine. There is a mildly displaced acute fracture of the posterolateral right seventh rib as seen on today's radiographs. Review of the MIP images confirms the above findings. IMPRESSION: 1. No pulmonary embolism is seen. Evaluation of the distal segmental and more distal vessels is limited by contrast bolus timing. 2. There is complete atelectasis of the right middle lobe, with multifocal areas of opacification and high-grade narrowing of multiple lobar and more distal branches of the right middle lobe airways. This may be secondary to mucous plugging. It is difficult to completely exclude an obstructing endobronchial lesion. 3. There is a 10 mm nodule within the posterosuperior lateral aspect of the right lower lobe. This region likely was not included on prior 10/21/2019 CT abdomen and pelvis. Comparison with prior CT chest imaging would be helpful to assess for longer term stability. If no prior imaging is available for comparison, consider one of the following in 3 months for both low-risk and high-risk individuals: (a) repeat chest CT, (b) follow-up PET-CT, or (c) tissue sampling. This recommendation follows the consensus statement: Guidelines for Management of Incidental Pulmonary Nodules Detected on CT Images: From the Fleischner Society 2017;  Radiology 2017; 284:228-243. 4. Mildly displaced acute fracture of the posterolateral right seventh rib as seen on radiographs today. No pneumothorax. Aortic Atherosclerosis (ICD10-I70.0) and Emphysema (ICD10-J43.9). Electronically Signed   By: Neita Garnet M.D.   On: 05/24/2022 13:13   DG Ribs Unilateral W/Chest Right  Result Date: 05/24/2022 CLINICAL DATA:  Cough, shortness of breath and chest pain. Rib pain on the right. EXAM: RIGHT RIBS AND CHEST - 3+ VIEW COMPARISON:  01/02/2020 FINDINGS: Heart size upper limits of normal. Chronic interstitial lung markings appear similar without evidence of acute consolidation, collapse or effusion. No pneumothorax or hemothorax. There is an acute fracture the right posterolateral seventh rib, displaced about 5 mm. Mild adjacent extrapleural thickening. IMPRESSION: Acute fracture of the right posterolateral seventh rib, displaced about 5 mm. No pneumothorax or hemothorax. Electronically Signed   By: Paulina Fusi M.D.   On: 05/24/2022 09:57    Procedures Procedures  CRITICAL CARE Performed by: Gwenyth Allegra Teddy Rebstock Total critical care time: 30 minutes Critical care time was exclusive of separately billable procedures and treating other patients. Critical care was necessary to treat or prevent imminent or life-threatening deterioration. Critical care was time spent personally by me on the following activities: development of treatment plan with patient and/or surrogate as well as nursing, discussions with consultants, evaluation of patient's response to treatment, examination of patient, obtaining history from patient or surrogate, ordering and performing treatments and interventions, ordering and review of laboratory studies, ordering and review of radiographic studies, pulse oximetry and re-evaluation of patient's condition.  Medications Ordered in ED Medications  cefTRIAXone (ROCEPHIN) 1 g in sodium chloride 0.9 % 100 mL IVPB (has no administration in  time range)  azithromycin (ZITHROMAX) 500 mg in sodium chloride 0.9 % 250 mL IVPB (has no administration in time range)  fentaNYL (SUBLIMAZE) injection 50 mcg (50 mcg Intravenous Given 05/24/22 0931)  fentaNYL (SUBLIMAZE) injection 50 mcg (50 mcg Intravenous Given 05/24/22 1200)  iohexol (OMNIPAQUE) 350 MG/ML injection 100 mL (75 mLs Intravenous Contrast Given 05/24/22 1215)    ED Course/ Medical Decision Making/ A&P                             Medical Decision Making Amount and/or Complexity of Data Reviewed Labs: ordered. Radiology: ordered.  Risk Prescription drug management. Decision regarding hospitalization.    Shirley Mays is a 67 y.o. female with a past medical history significant for cirrhosis and chronic ascites, previous pneumonia, previous pyelonephritis, and recent diagnosis of influenza who presents with severe right-sided pleuritic back and chest pain with shortness of breath and persistent cough.  According to patient, for the last 3 weeks she has had URI symptoms and cough that started with some fevers and chills.  The fevers have improved but she is having some mild chills intermittently.  She reports that she tested negative for COVID at first but then tested positive for influenza last week.  She took Tamiflu and was also started on Cipro per her PCP given her liver disease.  She says that over the last few days she has had worsened pain in her right chest and right back after having a large episode where she coughed and sneezed at the same time.  She reports extreme pleuritic discomfort up to 10 out of 10 at this time.  She otherwise denies nausea or vomiting, constipation, or diarrhea.  She is not noting any urinary changes but is having the pain in her right flank and right upper back.  She reports she has a history of rib fractures as a child and think she rebroke her right ribs.  She denies other trauma and denies any skin changes to suggest shingles.  She denies other  complaints.  On exam, patient has coarse breath sounds bilaterally.  No significant wheezing.  Chest was tender on the right lateral chest and right back was quite tender.  I do not appreciate crepitance and the breath sounds were sounding symmetric initially.  No midline back tenderness on exam.  Legs were edematous bilaterally she reports that is chronic as she does not take any medicine for her fluid overload.  She had no abdominal tenderness and had normal bowel sounds.  Patient is tachypneic on arrival and is borderline tachycardic with a rate about 99 when I was evaluating her.  EKG revealed no STEMI  Clinically I do feel need to get  imaging to rule out initially pneumonia or rib fractures.  Will get a right rib chest x-ray and central chest x-ray however if it is totally reassuring and she still having severe right-sided chest discomfort, will consider CT PE study to both rule out occult pneumo, occult fracture, or pulmonary embolism.  Will get screening labs and give her some pain medicine.  Will recheck viral testing to make sure she does not have COVID or RSV on top of her influenza.  Anticipate reassessment after workup to determine disposition.  12:00 PM Patient's labs again returned.  She is negative for COVID/flu/RSV.  Troponin negative x 2.  Urinalysis reassuring.  BNP normal and lactic acid nonelevated.  She does have a new leukocytosis and normal hemoglobin.  Metabolic panel overall reassuring.  Her x-ray does show evidence of acute rib fracture where she is hurting.  Given the patient's oxygen saturation about 92% I am still concerned there could be underlying pneumonia or a occult pneumothorax.  Given the age and pleuritic discomfort, we will get CT PE study to rule out pulmonary embolism, occult pneumo, pneumonia, or other rib fractures along with this 1.  Will also ambulate with pulse oximeter to make sure he does not get hypoxic.  If workup remains reassuring and she remains in the  90s on room air, she may be able to go home with pain regimen.  If she gets hypoxic or we find other concerning findings, we will discuss possible admission.  2:23 PM Patient reassessed and she is still having oxygen saturation about 90% and still having severe pain in her right lateral chest with coughing.  Her CT scan is concerning for multifocal consolidations, possible cancer, and middle lobe complete atelectasis/collapse.  No pneumothorax and she still has the rib fracture seen.  Given the leukocytosis, oxygen saturation 90%, inability to lay flat due to the shortness of breath, and now evidence of acute pneumonia, will give antibiotics and call for admission for further management of rib fracture, pneumonia, complete middle lobe atelectasis, and possible cancer.         Final Clinical Impression(s) / ED Diagnoses Final diagnoses:  Pneumonia of right middle lobe due to infectious organism  Closed fracture of one rib of right side, initial encounter  Atelectasis  Right-sided chest pain     Clinical Impression: 1. Pneumonia of right middle lobe due to infectious organism   2. Closed fracture of one rib of right side, initial encounter   3. Atelectasis   4. Right-sided chest pain     Disposition: Admit  This note was prepared with assistance of Dragon voice recognition software. Occasional wrong-word or sound-a-like substitutions may have occurred due to the inherent limitations of voice recognition software.     Kayden Hutmacher, Canary Brim, MD 05/24/22 [phone]

## 2022-05-25 ENCOUNTER — Encounter (HOSPITAL_COMMUNITY): Payer: Self-pay | Admitting: Family Medicine

## 2022-05-25 ENCOUNTER — Other Ambulatory Visit: Payer: Self-pay | Admitting: Internal Medicine

## 2022-05-25 DIAGNOSIS — J9811 Atelectasis: Secondary | ICD-10-CM | POA: Diagnosis present

## 2022-05-25 DIAGNOSIS — R911 Solitary pulmonary nodule: Secondary | ICD-10-CM

## 2022-05-25 DIAGNOSIS — S2239XA Fracture of one rib, unspecified side, initial encounter for closed fracture: Secondary | ICD-10-CM | POA: Diagnosis not present

## 2022-05-25 DIAGNOSIS — S2231XA Fracture of one rib, right side, initial encounter for closed fracture: Secondary | ICD-10-CM | POA: Diagnosis not present

## 2022-05-25 DIAGNOSIS — I10 Essential (primary) hypertension: Secondary | ICD-10-CM | POA: Diagnosis present

## 2022-05-25 DIAGNOSIS — K7460 Unspecified cirrhosis of liver: Secondary | ICD-10-CM | POA: Diagnosis present

## 2022-05-25 LAB — HIV ANTIBODY (ROUTINE TESTING W REFLEX): HIV Screen 4th Generation wRfx: NONREACTIVE

## 2022-05-25 LAB — COMPREHENSIVE METABOLIC PANEL
ALT: 17 U/L (ref 0–44)
AST: 28 U/L (ref 15–41)
Albumin: 3.7 g/dL (ref 3.5–5.0)
Alkaline Phosphatase: 67 U/L (ref 38–126)
Anion gap: 10 (ref 5–15)
BUN: 11 mg/dL (ref 8–23)
CO2: 21 mmol/L — ABNORMAL LOW (ref 22–32)
Calcium: 9.3 mg/dL (ref 8.9–10.3)
Chloride: 105 mmol/L (ref 98–111)
Creatinine, Ser: 0.99 mg/dL (ref 0.44–1.00)
GFR, Estimated: >60 mL/min (ref >60)
Glucose, Bld: 115 mg/dL — ABNORMAL HIGH (ref 70–99)
Potassium: 4.3 mmol/L (ref 3.5–5.1)
Sodium: 136 mmol/L (ref 135–145)
Total Bilirubin: 1.2 mg/dL (ref 0.3–1.2)
Total Protein: 8.1 g/dL (ref 6.5–8.1)

## 2022-05-25 LAB — CBC
HCT: 38.9 % (ref 36.0–46.0)
Hemoglobin: 13 g/dL (ref 12.0–15.0)
MCH: 29.6 pg (ref 26.0–34.0)
MCHC: 33.4 g/dL (ref 30.0–36.0)
MCV: 88.6 fL (ref 80.0–100.0)
Platelets: 253 10*3/uL (ref 150–400)
RBC: 4.39 MIL/uL (ref 3.87–5.11)
RDW: 13.5 % (ref 11.5–15.5)
WBC: 12.1 10*3/uL — ABNORMAL HIGH (ref 4.0–10.5)
nRBC: 0 % (ref 0.0–0.2)

## 2022-05-25 LAB — URINE CULTURE: Culture: NO GROWTH

## 2022-05-25 MED ORDER — IPRATROPIUM-ALBUTEROL 0.5-2.5 (3) MG/3ML IN SOLN
RESPIRATORY_TRACT | Status: AC
  Administered 2022-05-25: 3 mL
  Filled 2022-05-25: qty 3

## 2022-05-25 MED ORDER — ONDANSETRON HCL 4 MG PO TABS: 4.0000 mg | ORAL_TABLET | Freq: Four times a day (QID) | ORAL | Status: DC | PRN

## 2022-05-25 MED ORDER — OXYCODONE HCL 5 MG PO TABS: 5.0000 mg | ORAL_TABLET | ORAL | 0 refills | Status: AC | PRN

## 2022-05-25 MED ORDER — SODIUM CHLORIDE 0.9% FLUSH
3.0000 mL | Freq: Two times a day (BID) | INTRAVENOUS | Status: DC
  Administered 2022-05-25: 3 mL via INTRAVENOUS

## 2022-05-25 MED ORDER — ACETAMINOPHEN 325 MG PO TABS: 650.0000 mg | ORAL_TABLET | Freq: Four times a day (QID) | ORAL | Status: DC | PRN

## 2022-05-25 MED ORDER — FUROSEMIDE 40 MG PO TABS
40.0000 mg | ORAL_TABLET | Freq: Every day | ORAL | Status: DC
  Administered 2022-05-25: 40 mg via ORAL
  Filled 2022-05-25: qty 1

## 2022-05-25 MED ORDER — BISACODYL 5 MG PO TBEC: 5.0000 mg | DELAYED_RELEASE_TABLET | Freq: Every day | ORAL | Status: DC | PRN

## 2022-05-25 MED ORDER — ACETYLCYSTEINE 20 % IN SOLN
600.0000 mg | Freq: Three times a day (TID) | RESPIRATORY_TRACT | Status: DC
  Administered 2022-05-25: 600 mg via RESPIRATORY_TRACT
  Filled 2022-05-25 (×2): qty 4

## 2022-05-25 MED ORDER — POLYETHYLENE GLYCOL 3350 17 G PO PACK: 17.0000 g | PACK | Freq: Every day | ORAL | Status: DC | PRN

## 2022-05-25 MED ORDER — ONDANSETRON HCL 4 MG/2ML IJ SOLN: 4.0000 mg | Freq: Four times a day (QID) | INTRAMUSCULAR | Status: DC | PRN

## 2022-05-25 MED ORDER — GUAIFENESIN-DM 100-10 MG/5ML PO SYRP: 10.0000 mL | ORAL_SOLUTION | ORAL | 0 refills | Status: AC | PRN

## 2022-05-25 MED ORDER — HYDROMORPHONE HCL 1 MG/ML IJ SOLN: 0.5000 mg | INTRAMUSCULAR | Status: DC | PRN

## 2022-05-25 MED ORDER — ENOXAPARIN SODIUM 40 MG/0.4ML IJ SOSY
40.0000 mg | PREFILLED_SYRINGE | Freq: Every day | INTRAMUSCULAR | Status: DC
  Filled 2022-05-25: qty 0.4

## 2022-05-25 MED ORDER — LISINOPRIL 5 MG PO TABS
2.5000 mg | ORAL_TABLET | Freq: Every day | ORAL | Status: DC
  Administered 2022-05-25: 2.5 mg via ORAL
  Filled 2022-05-25: qty 1

## 2022-05-25 MED ORDER — LIDOCAINE 5 % EX PTCH: 1.0000 | MEDICATED_PATCH | Freq: Two times a day (BID) | CUTANEOUS | 0 refills | Status: AC

## 2022-05-25 MED ORDER — ACETAMINOPHEN 650 MG RE SUPP: 650.0000 mg | Freq: Four times a day (QID) | RECTAL | Status: DC | PRN

## 2022-05-25 MED ORDER — OXYCODONE HCL 5 MG PO TABS: 5.0000 mg | ORAL_TABLET | ORAL | Status: DC | PRN

## 2022-05-25 NOTE — H&P (Addendum)
History and Physical    Shirley Mays [id number] DOB: 12-26-1955 DOA: 05/24/2022  PCP: Charlott Rakes, MD   Patient coming from: Home   Chief Complaint: "I think I broke a rib"   HPI: Shirley Mays is a pleasant 67 y.o. female with medical history significant for alcohol abuse in remission, liver cirrhosis, hypertension, and BMI 40 who presents to the emergency department with severe right lateral chest pain after a cough.  Patient reports that she developed upper respiratory illness a couple weeks ago, was diagnosed with influenza and treated with Tamiflu, continued to have residual cough, and then experienced acute onset of severe right chest pain after a forceful cough and sneeze on 05/23/2022.  Pain continued to be severe, has limited her ability to take a deep breath or cough, and eventually prompted her presentation to the ED.  [facility] ED Course: Upon arrival to the ED, patient is found to be afebrile and saturating low 90s on room air with mild tachypnea and stable blood pressure.  CTA chest is negative for PE but concerning for complete atelectasis of right middle lobe, acute fracture of the right posterolateral seventh rib, and 10 mm right lower lobe nodule.  Patient was treated with Zofran, Reglan, fentanyl, Dilaudid, Rocephin, and azithromycin in the emergency department.  She was transferred to [facility] for admission.  Review of Systems:  All other systems reviewed and apart from HPI, are negative.  Past Medical History:  Diagnosis Date   Alcohol abuse    Cirrhosis ([place])    Hiatal hernia    Pneumonia    Pyelonephritis    Sigmoid diverticulosis     Past Surgical History:  Procedure Laterality Date   IR PARACENTESIS  10/16/2019   IR TRANSCATHETER BX  02/20/2020   IR US GUIDE VASC ACCESS RIGHT  02/20/2020   IR VENOGRAM HEPATIC W HEMODYNAMIC EVALUATION  02/20/2020   TUBAL LIGATION      Social History:   reports that she has been smoking cigarettes.  She has a 15.00 pack-year smoking history. She has never used smokeless tobacco. She reports that she does not currently use alcohol. She reports that she does not use drugs.  Allergies  Allergen Reactions   Prednisone     Swelling - "everything .. .joints mostly"    Family History  Problem Relation Age of Onset   Liver disease Mother    Kidney disease Mother    Heart disease Mother    Cancer Neg Hx    Anemia Neg Hx    Leukemia Neg Hx    Thrombocytopenia Neg Hx      Prior to Admission medications   Medication Sig Start Date End Date Taking? Authorizing Provider  Blood Pressure Monitoring (BLOOD PRESSURE MON/AUTO/WRIST) DEVI Use to check blood pressure once daily. 01/06/22   Charlott Rakes, MD  ciprofloxacin (CIPRO) 500 MG tablet Take 500 mg by mouth 2 (two) times daily. 05/09/22   [provider]  cyclobenzaprine (FLEXERIL) 10 MG tablet Take 1 tablet (10 mg total) by mouth at bedtime. 03/13/22   Charlott Rakes, MD  diclofenac Sodium (VOLTAREN) 1 % GEL Apply 4 g topically 4 (four) times daily. 06/02/20   Charlott Rakes, MD  furosemide (LASIX) 40 MG tablet TAKE ONE TABLET BY MOUTH DAILY 12/23/21   Charlott Rakes, MD  lisinopril (ZESTRIL) 2.5 MG tablet Take 1 tablet (2.5 mg total) by mouth daily. 03/13/22   Charlott Rakes, MD  meloxicam (MOBIC) 7.5 MG tablet Take 1 tablet (7.5 mg  total) by mouth daily. 03/13/22   Charlott Rakes, MD  Multiple Vitamin (MULTIVITAMIN WITH MINERALS) TABS tablet Take 1 tablet by mouth daily. 10/24/19   Mercy Riding, MD  oseltamivir (TAMIFLU) 75 MG capsule Take 75 mg by mouth 2 (two) times daily. 05/13/22   [provider]  thiamine 100 MG tablet Take 1 tablet (100 mg total) by mouth daily. 10/24/19   Mercy Riding, MD    Physical Exam: Vitals:   05/24/22 2024 05/24/22 2135 05/24/22 2344 05/25/22 0210  BP: 128/76  122/62 (!) 136/106  Pulse: 73  70 86  Resp: 18 20 18 18   Temp: 97.9 F (36.6 C) 97.8 F (36.6 C) 97.9 F (36.6 C) 98 F  (36.7 C)  TempSrc: Oral  Oral   SpO2: 99% 95% 99% 96%  Weight:    117.2 kg  Height:    5\' 7"  (1.702 m)    Constitutional: NAD, calm  Eyes: PERTLA, lids and conjunctivae normal ENMT: Mucous membranes are moist. Posterior pharynx clear of any exudate or lesions.   Neck: supple, no masses  Respiratory: no wheezing, no crackles. No accessory muscle use.  Cardiovascular: S1 & S2 heard, regular rate and rhythm. No JVD. Abdomen: No distension, no tenderness, soft. Bowel sounds active.  Musculoskeletal: no clubbing / cyanosis. No joint deformity upper and lower extremities.   Skin: no significant rashes, lesions, ulcers. Warm, dry, well-perfused. Neurologic: CN 2-12 grossly intact. Moving all extremities. Alert and oriented.  Psychiatric: Pleasant. Cooperative.    Labs and Imaging on Admission: I have personally reviewed following labs and imaging studies  CBC: Recent Labs  Lab 05/24/22 0926  WBC 12.4*  NEUTROABS 7.4  HGB 13.9  HCT 42.4  MCV 89.3  PLT 010   Basic Metabolic Panel: Recent Labs  Lab 05/24/22 0926  NA 138  K 4.4  CL 103  CO2 25  GLUCOSE 95  BUN 14  CREATININE 0.85  CALCIUM 10.2   GFR: Estimated Creatinine Clearance: 85 mL/min (by C-G formula based on SCr of 0.85 mg/dL). Liver Function Tests: Recent Labs  Lab 05/24/22 0926  AST 21  ALT 13  ALKPHOS 66  BILITOT 1.4*  PROT 9.0*  ALBUMIN 4.3   Recent Labs  Lab 05/24/22 0926  LIPASE 27   No results for input(s): "AMMONIA" in the last 168 hours. Coagulation Profile: No results for input(s): "INR", "PROTIME" in the last 168 hours. Cardiac Enzymes: No results for input(s): "CKTOTAL", "CKMB", "CKMBINDEX", "TROPONINI" in the last 168 hours. BNP (last 3 results) Recent Labs    03/13/22 1207  PROBNP 36   HbA1C: No results for input(s): "HGBA1C" in the last 72 hours. CBG: No results for input(s): "GLUCAP" in the last 168 hours. Lipid Profile: No results for input(s): "CHOL", "HDL", "LDLCALC",  "TRIG", "CHOLHDL", "LDLDIRECT" in the last 72 hours. Thyroid Function Tests: No results for input(s): "TSH", "T4TOTAL", "FREET4", "T3FREE", "THYROIDAB" in the last 72 hours. Anemia Panel: No results for input(s): "VITAMINB12", "FOLATE", "FERRITIN", "TIBC", "IRON", "RETICCTPCT" in the last 72 hours. Urine analysis:    Component Value Date/Time   COLORURINE YELLOW 05/24/2022 1140   APPEARANCEUR CLEAR 05/24/2022 1140   LABSPEC 1.017 05/24/2022 1140   PHURINE 5.0 05/24/2022 1140   GLUCOSEU NEGATIVE 05/24/2022 1140   HGBUR NEGATIVE 05/24/2022 1140   BILIRUBINUR NEGATIVE 05/24/2022 1140   KETONESUR NEGATIVE 05/24/2022 1140   PROTEINUR NEGATIVE 05/24/2022 1140   NITRITE NEGATIVE 05/24/2022 1140   LEUKOCYTESUR NEGATIVE 05/24/2022 1140   Sepsis Labs: @LABRCNTIP (procalcitonin:4,lacticidven:4) )  Recent Results (from the past 240 hour(s))  Resp panel by RT-PCR (RSV, Flu A&B, Covid) Anterior Nasal Swab     Status: None   Collection Time: 05/24/22  9:26 AM   Specimen: Anterior Nasal Swab  Result Value Ref Range Status   SARS Coronavirus 2 by RT PCR NEGATIVE NEGATIVE Final    Comment: (NOTE) SARS-CoV-2 target nucleic acids are NOT DETECTED.  The SARS-CoV-2 RNA is generally detectable in upper respiratory specimens during the acute phase of infection. The lowest concentration of SARS-CoV-2 viral copies this assay can detect is 138 copies/mL. A negative result does not preclude SARS-Cov-2 infection and should not be used as the sole basis for treatment or other patient management decisions. A negative result may occur with  improper specimen collection/handling, submission of specimen other than nasopharyngeal swab, presence of viral mutation(s) within the areas targeted by this assay, and inadequate number of viral copies(<138 copies/mL). A negative result must be combined with clinical observations, patient history, and epidemiological information. The expected result is Negative.  Fact  Sheet for Patients:  EntrepreneurPulse.com.au  Fact Sheet for Healthcare Providers:  IncredibleEmployment.be  This test is no t yet approved or cleared by the [place] FDA and  has been authorized for detection and/or diagnosis of SARS-CoV-2 by FDA under an Emergency Use Authorization (EUA). This EUA will remain  in effect (meaning this test can be used) for the duration of the COVID-19 declaration under Section 564(b)(1) of the Act, 21 U.S.C.section 360bbb-3(b)(1), unless the authorization is terminated  or revoked sooner.       Influenza A by PCR NEGATIVE NEGATIVE Final   Influenza B by PCR NEGATIVE NEGATIVE Final    Comment: (NOTE) The Xpert Xpress SARS-CoV-2/FLU/RSV plus assay is intended as an aid in the diagnosis of influenza from Nasopharyngeal swab specimens and should not be used as a sole basis for treatment. Nasal washings and aspirates are unacceptable for Xpert Xpress SARS-CoV-2/FLU/RSV testing.  Fact Sheet for Patients: EntrepreneurPulse.com.au  Fact Sheet for Healthcare Providers: IncredibleEmployment.be  This test is not yet approved or cleared by the [place] FDA and has been authorized for detection and/or diagnosis of SARS-CoV-2 by FDA under an Emergency Use Authorization (EUA). This EUA will remain in effect (meaning this test can be used) for the duration of the COVID-19 declaration under Section 564(b)(1) of the Act, 21 U.S.C. section 360bbb-3(b)(1), unless the authorization is terminated or revoked.     Resp Syncytial Virus by PCR NEGATIVE NEGATIVE Final    Comment: (NOTE) Fact Sheet for Patients: EntrepreneurPulse.com.au  Fact Sheet for Healthcare Providers: IncredibleEmployment.be  This test is not yet approved or cleared by the [place] FDA and has been authorized for detection and/or diagnosis of SARS-CoV-2 by FDA under an  Emergency Use Authorization (EUA). This EUA will remain in effect (meaning this test can be used) for the duration of the COVID-19 declaration under Section 564(b)(1) of the Act, 21 U.S.C. section 360bbb-3(b)(1), unless the authorization is terminated or revoked.  Performed at [facility], [address] [place], [place] [address]      Radiological Exams on Admission: CT Angio Chest PE W and/or Wo Contrast  Result Date: 05/24/2022 CLINICAL DATA:  Pulmonary embolism suspected. High probability rib fracture. Evaluate for pneumonia, pulmonary embolism, or pneumothorax. Rib pain after coughing and sneezing yesterday. Patient states "I feel like I broke a rib." Pinpoints pain to right lateral rib and across right mid back. Recently diagnosed by PCP with flu. EXAM: CT ANGIOGRAPHY  CHEST WITH CONTRAST TECHNIQUE: Multidetector CT imaging of the chest was performed using the standard protocol during bolus administration of intravenous contrast. Multiplanar CT image reconstructions and MIPs were obtained to evaluate the vascular anatomy. RADIATION DOSE REDUCTION: This exam was performed according to the departmental dose-optimization program which includes automated exposure control, adjustment of the mA and/or kV according to patient size and/or use of iterative reconstruction technique. CONTRAST:  64mL OMNIPAQUE IOHEXOL 350 MG/ML SOLN COMPARISON:  PA chest and bilateral rib radiographs 05/24/2022, PA chest and right rib radiographs 01/02/2020; CT abdomen and pelvis 10/21/2019 FINDINGS: Cardiovascular: The main pulmonary artery is opacified up to 191 Hounsfield units. No filling defect is seen to indicate an acute pulmonary embolism. Evaluation of the distal segmental and more distal vessels is limited by contrast bolus timing. Heart size is mildly enlarged. No pericardial effusion. There is a normal variant aberrant origin of the right subclavian artery, originating from the distal aspect  of the aortic arch and coursing posterior to the trachea and esophagus (axial series 4, image 29). No thoracic aortic aneurysm. Mild atherosclerotic calcifications within the thoracic aorta. Mediastinum/Nodes: No axillary, pathologically enlarged lymph nodes by CT criteria. There is a 1.6 cm short axis right paratracheal lymph node (axial series 4, image 44), possibly reactive. Smaller but still enlarged more inferior pretracheal and right peribronchial lymph nodes are also noted. The thyroid is unremarkable. The the esophagus follows a normal course of normal caliber. Lungs/Pleura: There is complete atelectasis of the right middle lobe, with multifocal areas of opacification and high-grade narrowing of multiple lobar and more distal branches of the right middle lobe airways. Moderate right and mild left superior paraseptal emphysematous changes. Small right pleural effusion. There is a 10 x 9 x 9 mm (transverse by craniocaudal, axial series 6, image 62 and sagittal series 8, image 57) nodule within the posterosuperior lateral aspect of the right lower lobe. This region likely was not included on prior 10/21/2019 CT abdomen and pelvis. There is atelectasis of the posteroinferior lateral lingula and curvilinear atelectasis within adjacent portions of the inferior aspect of the left lower lobe. No left pleural effusion. Upper Abdomen: Mildly nodular liver contour is unchanged, again compatible with cirrhosis. No upper abdominal ascites is seen, and there appears to be resolution of upper abdominal ascites seen on prior 10/21/2019 CT. Musculoskeletal: Mild-to-moderate multilevel degenerative disc changes of the thoracic spine. There is a mildly displaced acute fracture of the posterolateral right seventh rib as seen on today's radiographs. Review of the MIP images confirms the above findings. IMPRESSION: 1. No pulmonary embolism is seen. Evaluation of the distal segmental and more distal vessels is limited by contrast  bolus timing. 2. There is complete atelectasis of the right middle lobe, with multifocal areas of opacification and high-grade narrowing of multiple lobar and more distal branches of the right middle lobe airways. This may be secondary to mucous plugging. It is difficult to completely exclude an obstructing endobronchial lesion. 3. There is a 10 mm nodule within the posterosuperior lateral aspect of the right lower lobe. This region likely was not included on prior 10/21/2019 CT abdomen and pelvis. Comparison with prior CT chest imaging would be helpful to assess for longer term stability. If no prior imaging is available for comparison, consider one of the following in 3 months for both low-risk and high-risk individuals: (a) repeat chest CT, (b) follow-up PET-CT, or (c) tissue sampling. This recommendation follows the consensus statement: Guidelines for Management of Incidental Pulmonary Nodules Detected on  CT Images: From the Fleischner Society 2017; Radiology 2017; 284:228-243. 4. Mildly displaced acute fracture of the posterolateral right seventh rib as seen on radiographs today. No pneumothorax. Aortic Atherosclerosis (ICD10-I70.0) and Emphysema (ICD10-J43.9). Electronically Signed   By: Neita Garnet M.D.   On: 05/24/2022 13:13   DG Ribs Unilateral W/Chest Right  Result Date: 05/24/2022 CLINICAL DATA:  Cough, shortness of breath and chest pain. Rib pain on the right. EXAM: RIGHT RIBS AND CHEST - 3+ VIEW COMPARISON:  01/02/2020 FINDINGS: Heart size upper limits of normal. Chronic interstitial lung markings appear similar without evidence of acute consolidation, collapse or effusion. No pneumothorax or hemothorax. There is an acute fracture the right posterolateral seventh rib, displaced about 5 mm. Mild adjacent extrapleural thickening. IMPRESSION: Acute fracture of the right posterolateral seventh rib, displaced about 5 mm. No pneumothorax or hemothorax. Electronically Signed   By: Paulina Fusi M.D.   On:  05/24/2022 09:57    EKG: Independently reviewed. Sinus rhythm.   Assessment/Plan   1. Rib fracture  - Presents with acute-onset severe right lateral chest pain after a cough and is found to have acute fracture involving posterolateral 7th rib  - She is requiring IV Dilaudid for pain-control  - Continue pain-control, incentive spirometry, transition to oral medications as able    2. Atelectasis  - CT notable for complete atelectasis of RML, suspected secondary to mucous plugging   - Continue supportive care, trial Mucomyst, discuss with pulmonology in am    3. Cirrhosis  - Appears compensated  - Continue Lasix   4. Hypertension  - Continue lisinopril    5. Lung nodule  - 10 mm RLL nodule noted on CT in ED  - Discussed with patient, outpatient follow-up recommended    DVT prophylaxis: Lovenox  Code Status: Full  Level of Care: Level of care: Telemetry Family Communication: None present  Disposition Plan:  Patient is from: Home  Anticipated d/c is to: Home Anticipated d/c date is: 05/26/22  Patient currently: Pending pain-control, transition to oral medications  Consults called: none  Admission status: Observation     Briscoe Deutscher, MD [facility]  05/25/2022, 3:19 AM

## 2022-05-25 NOTE — Plan of Care (Signed)

## 2022-05-25 NOTE — Progress Notes (Signed)
   05/25/22 1000  Mobility  Activity Ambulated independently in hallway  Level of Assistance Independent  Assistive Device None  Distance Ambulated (ft) 300 ft  Activity Response Tolerated well  Mobility Referral Yes  $Mobility charge 1 Mobility   Mobility Specialist Progress Note  Pt was in bed and agreeable. Had no c/o pain. Left EOB w/ all needs met and call bell in reach  [place] Specialist  Please contact via [place] or Rehab office at [phone]

## 2022-05-25 NOTE — TOC Transition Note (Addendum)
Transition of Care [facility]) - CM/SW Discharge Note   Patient Details  Name: Shirley Mays MRN: [id number] Date of Birth: [birth year]  [facility]) CM/SW Contact:  Zenon Mayo, RN Phone Number: 05/25/2022, 10:17 AM   Clinical Narrative:    Patient is for dc today, she has no needs. Her daughter will transport her home today.         Patient Goals and CMS Choice      Discharge Placement                         Discharge Plan and Services Additional resources added to the After Visit Summary for                                       Social Determinants of Health (SDOH) Interventions SDOH Screenings   Depression (PHQ2-9): Medium Risk (12/01/2021)  Tobacco Use: High Risk (05/25/2022)     Readmission Risk Interventions     No data to display

## 2022-05-25 NOTE — TOC Progression Note (Signed)
Transition of Care [facility]) - Progression Note    Patient Details  Name: Shirley Mays MRN: [id number] Date of Birth: [birth year]  [facility]) CM/SW Contact  Zenon Mayo, RN Phone Number: 05/25/2022, 7:11 AM  Clinical Narrative:    From home , presents with rib fx, atx secondary to suspected  mucous plugg, lung nodule noted on CT in ED per MD note.  Conts on iv dilaudid for pain control, received iv abx in ED.  TOC following.         Expected Discharge Plan and Services                                               Social Determinants of Health (SDOH) Interventions SDOH Screenings   Depression (PHQ2-9): Medium Risk (12/01/2021)  Tobacco Use: High Risk (05/25/2022)    Readmission Risk Interventions     No data to display

## 2022-05-25 NOTE — Discharge Summary (Signed)
Physician Discharge Summary  [facility] DZH:[id number] DOB: 1955/07/16 DOA: 05/24/2022  PCP: Hoy Register, MD  Admit date: 05/24/2022 Discharge date: 05/25/2022  Admitted From: Home Disposition: Home  Recommendations for Outpatient Follow-up:  Follow up with PCP in 1-2 weeks She will continue to use incentive spirometry flutter valve and mucolytic's at home.  Home Health: N/A Equipment/Devices: N/A  Discharge Condition: Stable CODE STATUS: Full code Diet recommendation: Low-salt diet  Discharge summary:  67 year old female with history of liver cirrhosis, hypertension who suffered from influenza about 3 weeks ago, continued to have residual cough and had sudden onset of right posterior chest wall pain that was severe and catchy after an episode of forceful coughing and sneezing.  She tried to stay home and manage symptoms however it was very painful so came to emergency room.  She was on room air.  CT angiogram of the chest was negative for pulmonary embolism which showed atelectasis of the right middle lobe, right posterolateral seventh rib fracture and 10 mm right lower lobe nodule.  Patient was treated with Zofran, fentanyl and Dilaudid Rocephin azithromycin.  Her pain was uncontrolled so was sent for observation.  Overnight patient did fairly well.  She is currently asymptomatic.  Pain is controlled.  Moving around in the hallway.  On room air.  She was started on Mucomyst and was able to expectorate a lot of sputum with improvement of symptoms.  Postviral cough, cough induced rib fracture, middle lobe atelectasis. -Continue ciprofloxacin to complete 7 days of therapy. -She will continue to use mucolytic's, incentive spirometry and flutter valve therapy at home. -Tylenol, oxycodone for moderate to severe pain, local lidocaine patch for rib fracture.  Already improving. -She has pulmonary nodule, will need follow-up.  will send referral to pulmonary clinic to schedule follow-up and  further recommendations. Referral sent to [facility] pulmonary.  Stable for discharge.     Discharge Diagnoses:  Principal Problem:   Rib fracture Active Problems:   Cirrhosis (HCC)   Hypertension   Atelectasis   Lung nodule    Discharge Instructions  Discharge Instructions     Call MD for:  difficulty breathing, headache or visual disturbances   Complete by: As directed    Call MD for:  severe uncontrolled pain   Complete by: As directed    Diet - low sodium heart healthy   Complete by: As directed    Increase activity slowly   Complete by: As directed       Allergies as of 05/25/2022       Reactions   Prednisone    Swelling - "everything .. .joints mostly"        Medication List     STOP taking these medications    oseltamivir 75 MG capsule Commonly known as: TAMIFLU   thiamine 100 MG tablet Commonly known as: VITAMIN B1       TAKE these medications    ciprofloxacin 500 MG tablet Commonly known as: CIPRO Take 500 mg by mouth 2 (two) times daily. For 10 days   furosemide 40 MG tablet Commonly known as: LASIX TAKE ONE TABLET BY MOUTH DAILY   guaiFENesin-dextromethorphan 100-10 MG/5ML syrup Commonly known as: ROBITUSSIN DM Take 10 mLs by mouth every 4 (four) hours as needed for cough.   lidocaine 5 % Commonly known as: Lidoderm Place 1 patch onto the skin every 12 (twelve) hours for 14 days. Remove & Discard patch within 12 hours or as directed by MD   lisinopril 2.5 MG tablet  Commonly known as: ZESTRIL Take 1 tablet (2.5 mg total) by mouth daily.   meloxicam 7.5 MG tablet Commonly known as: MOBIC Take 1 tablet (7.5 mg total) by mouth daily. What changed:  when to take this reasons to take this   multivitamin with minerals Tabs tablet Take 1 tablet by mouth daily.   oxyCODONE 5 MG immediate release tablet Commonly known as: Oxy IR/ROXICODONE Take 1 tablet (5 mg total) by mouth every 4 (four) hours as needed for up to 5 days for  moderate pain.        Follow-up Information     Hoy Register, MD Follow up.   Specialty: Family Medicine Why: The office will call patient. Contact information: [address] [place] [address] [phone]                Allergies  Allergen Reactions   Prednisone     Swelling - "everything .. .joints mostly"    Consultations: None   Procedures/Studies: CT Angio Chest PE W and/or Wo Contrast  Result Date: 05/24/2022 CLINICAL DATA:  Pulmonary embolism suspected. High probability rib fracture. Evaluate for pneumonia, pulmonary embolism, or pneumothorax. Rib pain after coughing and sneezing yesterday. Patient states "I feel like I broke a rib." Pinpoints pain to right lateral rib and across right mid back. Recently diagnosed by PCP with flu. EXAM: CT ANGIOGRAPHY CHEST WITH CONTRAST TECHNIQUE: Multidetector CT imaging of the chest was performed using the standard protocol during bolus administration of intravenous contrast. Multiplanar CT image reconstructions and MIPs were obtained to evaluate the vascular anatomy. RADIATION DOSE REDUCTION: This exam was performed according to the departmental dose-optimization program which includes automated exposure control, adjustment of the mA and/or kV according to patient size and/or use of iterative reconstruction technique. CONTRAST:  72mL OMNIPAQUE IOHEXOL 350 MG/ML SOLN COMPARISON:  PA chest and bilateral rib radiographs 05/24/2022, PA chest and right rib radiographs 01/02/2020; CT abdomen and pelvis 10/21/2019 FINDINGS: Cardiovascular: The main pulmonary artery is opacified up to 191 Hounsfield units. No filling defect is seen to indicate an acute pulmonary embolism. Evaluation of the distal segmental and more distal vessels is limited by contrast bolus timing. Heart size is mildly enlarged. No pericardial effusion. There is a normal variant aberrant origin of the right subclavian artery, originating from the distal  aspect of the aortic arch and coursing posterior to the trachea and esophagus (axial series 4, image 29). No thoracic aortic aneurysm. Mild atherosclerotic calcifications within the thoracic aorta. Mediastinum/Nodes: No axillary, pathologically enlarged lymph nodes by CT criteria. There is a 1.6 cm short axis right paratracheal lymph node (axial series 4, image 44), possibly reactive. Smaller but still enlarged more inferior pretracheal and right peribronchial lymph nodes are also noted. The thyroid is unremarkable. The the esophagus follows a normal course of normal caliber. Lungs/Pleura: There is complete atelectasis of the right middle lobe, with multifocal areas of opacification and high-grade narrowing of multiple lobar and more distal branches of the right middle lobe airways. Moderate right and mild left superior paraseptal emphysematous changes. Small right pleural effusion. There is a 10 x 9 x 9 mm (transverse by craniocaudal, axial series 6, image 62 and sagittal series 8, image 57) nodule within the posterosuperior lateral aspect of the right lower lobe. This region likely was not included on prior 10/21/2019 CT abdomen and pelvis. There is atelectasis of the posteroinferior lateral lingula and curvilinear atelectasis within adjacent portions of the inferior aspect of the left lower lobe.  No left pleural effusion. Upper Abdomen: Mildly nodular liver contour is unchanged, again compatible with cirrhosis. No upper abdominal ascites is seen, and there appears to be resolution of upper abdominal ascites seen on prior 10/21/2019 CT. Musculoskeletal: Mild-to-moderate multilevel degenerative disc changes of the thoracic spine. There is a mildly displaced acute fracture of the posterolateral right seventh rib as seen on today's radiographs. Review of the MIP images confirms the above findings. IMPRESSION: 1. No pulmonary embolism is seen. Evaluation of the distal segmental and more distal vessels is limited by  contrast bolus timing. 2. There is complete atelectasis of the right middle lobe, with multifocal areas of opacification and high-grade narrowing of multiple lobar and more distal branches of the right middle lobe airways. This may be secondary to mucous plugging. It is difficult to completely exclude an obstructing endobronchial lesion. 3. There is a 10 mm nodule within the posterosuperior lateral aspect of the right lower lobe. This region likely was not included on prior 10/21/2019 CT abdomen and pelvis. Comparison with prior CT chest imaging would be helpful to assess for longer term stability. If no prior imaging is available for comparison, consider one of the following in 3 months for both low-risk and high-risk individuals: (a) repeat chest CT, (b) follow-up PET-CT, or (c) tissue sampling. This recommendation follows the consensus statement: Guidelines for Management of Incidental Pulmonary Nodules Detected on CT Images: From the Fleischner Society 2017; Radiology 2017; 284:228-243. 4. Mildly displaced acute fracture of the posterolateral right seventh rib as seen on radiographs today. No pneumothorax. Aortic Atherosclerosis (ICD10-I70.0) and Emphysema (ICD10-J43.9). Electronically Signed   By: Yvonne Kendall M.D.   On: 05/24/2022 13:13   DG Ribs Unilateral W/Chest Right  Result Date: 05/24/2022 CLINICAL DATA:  Cough, shortness of breath and chest pain. Rib pain on the right. EXAM: RIGHT RIBS AND CHEST - 3+ VIEW COMPARISON:  01/02/2020 FINDINGS: Heart size upper limits of normal. Chronic interstitial lung markings appear similar without evidence of acute consolidation, collapse or effusion. No pneumothorax or hemothorax. There is an acute fracture the right posterolateral seventh rib, displaced about 5 mm. Mild adjacent extrapleural thickening. IMPRESSION: Acute fracture of the right posterolateral seventh rib, displaced about 5 mm. No pneumothorax or hemothorax. Electronically Signed   By: Nelson Chimes  M.D.   On: 05/24/2022 09:57   (Echo, Carotid, EGD, Colonoscopy, ERCP)    Subjective: Patient seen in the morning rounds.  Pain is minimal and has not needed anything since admitted from the emergency room.  Able to take deep breaths in.  After Mucomyst inhalation, she had large amount of phlegm and able to cough easily now.  She was very happy to take deep breaths and feel better.  Afebrile overnight.  Eager to go home.   Discharge Exam: Vitals:   05/25/22 0811 05/25/22 0915  BP: 135/81   Pulse: 90   Resp: 20   Temp: 98.1 F (36.7 C)   SpO2: 93% 96%   Vitals:   05/25/22 0210 05/25/22 0435 05/25/22 0811 05/25/22 0915  BP: (!) 136/106 134/68 135/81   Pulse: 86 88 90   Resp: 18 20 20    Temp: 98 F (36.7 C) 98 F (36.7 C) 98.1 F (36.7 C)   TempSrc:  Oral Oral   SpO2: 96% 96% 93% 96%  Weight: 117.2 kg     Height: 5\' 7"  (1.702 m)       General: Pt is alert, awake, not in acute distress Walking in the hallway.  On  room air. Cardiovascular: RRR, S1/S2 +, no rubs, no gallops Respiratory: CTA bilaterally, no wheezing, no rhonchi, no added sounds. Abdominal: Soft, NT, ND, bowel sounds + Extremities: no edema, no cyanosis    The results of significant diagnostics from this hospitalization (including imaging, microbiology, ancillary and laboratory) are listed below for reference.     Microbiology: Recent Results (from the past 240 hour(s))  Resp panel by RT-PCR (RSV, Flu A&B, Covid) Anterior Nasal Swab     Status: None   Collection Time: 05/24/22  9:26 AM   Specimen: Anterior Nasal Swab  Result Value Ref Range Status   SARS Coronavirus 2 by RT PCR NEGATIVE NEGATIVE Final    Comment: (NOTE) SARS-CoV-2 target nucleic acids are NOT DETECTED.  The SARS-CoV-2 RNA is generally detectable in upper respiratory specimens during the acute phase of infection. The lowest concentration of SARS-CoV-2 viral copies this assay can detect is 138 copies/mL. A negative result does not  preclude SARS-Cov-2 infection and should not be used as the sole basis for treatment or other patient management decisions. A negative result may occur with  improper specimen collection/handling, submission of specimen other than nasopharyngeal swab, presence of viral mutation(s) within the areas targeted by this assay, and inadequate number of viral copies(<138 copies/mL). A negative result must be combined with clinical observations, patient history, and epidemiological information. The expected result is Negative.  Fact Sheet for Patients:  BloggerCourse.com  Fact Sheet for Healthcare Providers:  SeriousBroker.it  This test is no t yet approved or cleared by the [place] FDA and  has been authorized for detection and/or diagnosis of SARS-CoV-2 by FDA under an Emergency Use Authorization (EUA). This EUA will remain  in effect (meaning this test can be used) for the duration of the COVID-19 declaration under Section 564(b)(1) of the Act, 21 U.S.C.section 360bbb-3(b)(1), unless the authorization is terminated  or revoked sooner.       Influenza A by PCR NEGATIVE NEGATIVE Final   Influenza B by PCR NEGATIVE NEGATIVE Final    Comment: (NOTE) The Xpert Xpress SARS-CoV-2/FLU/RSV plus assay is intended as an aid in the diagnosis of influenza from Nasopharyngeal swab specimens and should not be used as a sole basis for treatment. Nasal washings and aspirates are unacceptable for Xpert Xpress SARS-CoV-2/FLU/RSV testing.  Fact Sheet for Patients: BloggerCourse.com  Fact Sheet for Healthcare Providers: SeriousBroker.it  This test is not yet approved or cleared by the [place] FDA and has been authorized for detection and/or diagnosis of SARS-CoV-2 by FDA under an Emergency Use Authorization (EUA). This EUA will remain in effect (meaning this test can be used) for the  duration of the COVID-19 declaration under Section 564(b)(1) of the Act, 21 U.S.C. section 360bbb-3(b)(1), unless the authorization is terminated or revoked.     Resp Syncytial Virus by PCR NEGATIVE NEGATIVE Final    Comment: (NOTE) Fact Sheet for Patients: BloggerCourse.com  Fact Sheet for Healthcare Providers: SeriousBroker.it  This test is not yet approved or cleared by the [place] FDA and has been authorized for detection and/or diagnosis of SARS-CoV-2 by FDA under an Emergency Use Authorization (EUA). This EUA will remain in effect (meaning this test can be used) for the duration of the COVID-19 declaration under Section 564(b)(1) of the Act, 21 U.S.C. section 360bbb-3(b)(1), unless the authorization is terminated or revoked.  Performed at [facility], [address], [place], [place] [address]      Labs: BNP (last 3 results) Recent Labs  05/24/22 0926  BNP 40.9   Basic Metabolic Panel: Recent Labs  Lab 05/24/22 0926 05/25/22 0803  NA 138 136  K 4.4 4.3  CL 103 105  CO2 25 21*  GLUCOSE 95 115*  BUN 14 11  CREATININE 0.85 0.99  CALCIUM 10.2 9.3   Liver Function Tests: Recent Labs  Lab 05/24/22 0926 05/25/22 0803  AST 21 28  ALT 13 17  ALKPHOS 66 67  BILITOT 1.4* 1.2  PROT 9.0* 8.1  ALBUMIN 4.3 3.7   Recent Labs  Lab 05/24/22 0926  LIPASE 27   No results for input(s): "AMMONIA" in the last 168 hours. CBC: Recent Labs  Lab 05/24/22 0926 05/25/22 0803  WBC 12.4* 12.1*  NEUTROABS 7.4  --   HGB 13.9 13.0  HCT 42.4 38.9  MCV 89.3 88.6  PLT 274 253   Cardiac Enzymes: No results for input(s): "CKTOTAL", "CKMB", "CKMBINDEX", "TROPONINI" in the last 168 hours. BNP: Invalid input(s): "POCBNP" CBG: No results for input(s): "GLUCAP" in the last 168 hours. D-Dimer No results for input(s): "DDIMER" in the last 72 hours. Hgb A1c No results for input(s): "HGBA1C" in  the last 72 hours. Lipid Profile No results for input(s): "CHOL", "HDL", "LDLCALC", "TRIG", "CHOLHDL", "LDLDIRECT" in the last 72 hours. Thyroid function studies No results for input(s): "TSH", "T4TOTAL", "T3FREE", "THYROIDAB" in the last 72 hours.  Invalid input(s): "FREET3" Anemia work up No results for input(s): "VITAMINB12", "FOLATE", "FERRITIN", "TIBC", "IRON", "RETICCTPCT" in the last 72 hours. Urinalysis    Component Value Date/Time   COLORURINE YELLOW 05/24/2022 1140   APPEARANCEUR CLEAR 05/24/2022 1140   LABSPEC 1.017 05/24/2022 1140   PHURINE 5.0 05/24/2022 1140   GLUCOSEU NEGATIVE 05/24/2022 1140   HGBUR NEGATIVE 05/24/2022 1140   BILIRUBINUR NEGATIVE 05/24/2022 1140   KETONESUR NEGATIVE 05/24/2022 1140   PROTEINUR NEGATIVE 05/24/2022 1140   NITRITE NEGATIVE 05/24/2022 1140   LEUKOCYTESUR NEGATIVE 05/24/2022 1140   Sepsis Labs Recent Labs  Lab 05/24/22 0926 05/25/22 0803  WBC 12.4* 12.1*   Microbiology Recent Results (from the past 240 hour(s))  Resp panel by RT-PCR (RSV, Flu A&B, Covid) Anterior Nasal Swab     Status: None   Collection Time: 05/24/22  9:26 AM   Specimen: Anterior Nasal Swab  Result Value Ref Range Status   SARS Coronavirus 2 by RT PCR NEGATIVE NEGATIVE Final    Comment: (NOTE) SARS-CoV-2 target nucleic acids are NOT DETECTED.  The SARS-CoV-2 RNA is generally detectable in upper respiratory specimens during the acute phase of infection. The lowest concentration of SARS-CoV-2 viral copies this assay can detect is 138 copies/mL. A negative result does not preclude SARS-Cov-2 infection and should not be used as the sole basis for treatment or other patient management decisions. A negative result may occur with  improper specimen collection/handling, submission of specimen other than nasopharyngeal swab, presence of viral mutation(s) within the areas targeted by this assay, and inadequate number of viral copies(<138 copies/mL). A negative  result must be combined with clinical observations, patient history, and epidemiological information. The expected result is Negative.  Fact Sheet for Patients:  EntrepreneurPulse.com.au  Fact Sheet for Healthcare Providers:  IncredibleEmployment.be  This test is no t yet approved or cleared by the [place] FDA and  has been authorized for detection and/or diagnosis of SARS-CoV-2 by FDA under an Emergency Use Authorization (EUA). This EUA will remain  in effect (meaning this test can be used) for the duration of the COVID-19 declaration under Section 564(b)(1) of the  Act, 21 U.S.C.section 360bbb-3(b)(1), unless the authorization is terminated  or revoked sooner.       Influenza A by PCR NEGATIVE NEGATIVE Final   Influenza B by PCR NEGATIVE NEGATIVE Final    Comment: (NOTE) The Xpert Xpress SARS-CoV-2/FLU/RSV plus assay is intended as an aid in the diagnosis of influenza from Nasopharyngeal swab specimens and should not be used as a sole basis for treatment. Nasal washings and aspirates are unacceptable for Xpert Xpress SARS-CoV-2/FLU/RSV testing.  Fact Sheet for Patients: BloggerCourse.comhttps://www.fda.gov/media/152166/download  Fact Sheet for Healthcare Providers: SeriousBroker.ithttps://www.fda.gov/media/152162/download  This test is not yet approved or cleared by the [place] FDA and has been authorized for detection and/or diagnosis of SARS-CoV-2 by FDA under an Emergency Use Authorization (EUA). This EUA will remain in effect (meaning this test can be used) for the duration of the COVID-19 declaration under Section 564(b)(1) of the Act, 21 U.S.C. section 360bbb-3(b)(1), unless the authorization is terminated or revoked.     Resp Syncytial Virus by PCR NEGATIVE NEGATIVE Final    Comment: (NOTE) Fact Sheet for Patients: BloggerCourse.comhttps://www.fda.gov/media/152166/download  Fact Sheet for Healthcare Providers: SeriousBroker.ithttps://www.fda.gov/media/152162/download  This  test is not yet approved or cleared by the [place] FDA and has been authorized for detection and/or diagnosis of SARS-CoV-2 by FDA under an Emergency Use Authorization (EUA). This EUA will remain in effect (meaning this test can be used) for the duration of the COVID-19 declaration under Section 564(b)(1) of the Act, 21 U.S.C. section 360bbb-3(b)(1), unless the authorization is terminated or revoked.  Performed at [facility], [address], [place], [place] [id number]      Time coordinating discharge: 35 minutes  SIGNED:   Dorcas CarrowKuber Tommy Minichiello, MD  [facility] 05/25/2022, 1:56 PM

## 2022-05-25 NOTE — Progress Notes (Signed)
Explained discharge instructions to patient. Reviewed follow up appointment and next medication administration times. Also reviewed education. Patient verbalized having an understanding for instructions given. All belongings are in the patient's possession. IV and telemetry were removed. CCMD was notified. No other needs verbalized. Transported downstairs to the discharge lounge until her ride comes.

## 2022-05-29 ENCOUNTER — Telehealth: Payer: Self-pay

## 2022-05-29 NOTE — Telephone Encounter (Signed)
Transition Care Management Unsuccessful Follow-up Telephone Call  Date of discharge and from where:  05/25/2022, [facility]  Attempts:  1st Attempt  Reason for unsuccessful TCM follow-up call:  Unable to reach patient-  [phone], the recording stated that the call cannot be completed at this time.   Patient has an appointment at [facility]: 06/07/2022 with Epimenio Sarin, PA

## 2022-05-30 ENCOUNTER — Telehealth: Payer: Self-pay

## 2022-05-30 NOTE — Telephone Encounter (Signed)
Transition Care Management Follow-up Telephone Call  The patient returned my call. She said her phone is wet and she is not able to accept calls but she can make them.  Date of discharge and from where: 05/25/2022, [facility] How have you been since you were released from the hospital? She said she is feeling much better, breathing better, less pain . She stated she is able to laugh!  She has been using the flutter valve and incentive spirometer.  Any questions or concerns? No  Items Reviewed: Did the pt receive and understand the discharge instructions provided? Yes  Medications obtained and verified? Yes - she said she has all of her medications and did not have any questions about the med regime.  Other? No  Any new allergies since your discharge? No  Dietary orders reviewed? Yes Do you have support at home? Yes   Home Care and Equipment/Supplies: Were home health services ordered? no If so, what is the name of the agency? N/a  Has the agency set up a time to come to the patient's home? not applicable Were any new equipment or medical supplies ordered?  No What is the name of the medical supply agency? N/a Were you able to get the supplies/equipment? not applicable Do you have any questions related to the use of the equipment or supplies? No  Functional Questionnaire: (I = Independent and D = Dependent) ADLs: independent  Follow up appointments reviewed:  PCP Hospital f/u appt confirmed? Yes  Scheduled to see Epimenio Sarin, PA - 06/07/2022.  [facility] f/u appt confirmed?  None scheduled at this time.    Are transportation arrangements needed? No  I reminded her that Medicaid will pay for transportation to medical appointments. She needs to call DSS to register for transportation and then give a 2-3 day notice when scheduling a ride for an appointment  If their condition worsens, is the pt aware to call PCP or go to the Emergency Dept.? Yes Was the patient  provided with contact information for the PCP's office or ED? Yes Was to pt encouraged to call back with questions or concerns? Yes

## 2022-05-30 NOTE — Telephone Encounter (Signed)
Transition Care Management Unsuccessful Follow-up Telephone Call  Date of discharge and from where:  05/25/2022, [facility]  Attempts:  2nd Attempt  Reason for unsuccessful TCM follow-up call:  Unable to reach patient- I called  [phone] and the recording stated that the call cannot be completed at this time.    Patient has an appointment at [facility]: 06/07/2022 with Epimenio Sarin, PA

## 2022-06-05 NOTE — Progress Notes (Unsigned)
Patient ID: Shirley Mays, female   DOB: 1955-07-05, 67 y.o.   MRN: [id number]  After overnight hospitalization 1/24-1/25/2024 Discharge summary:   67 year old female with history of liver cirrhosis, hypertension who suffered from influenza about 3 weeks ago, continued to have residual cough and had sudden onset of right posterior chest wall pain that was severe and catchy after an episode of forceful coughing and sneezing.  She tried to stay home and manage symptoms however it was very painful so came to emergency room.  She was on room air.  CT angiogram of the chest was negative for pulmonary embolism which showed atelectasis of the right middle lobe, right posterolateral seventh rib fracture and 10 mm right lower lobe nodule.  Patient was treated with Zofran, fentanyl and Dilaudid Rocephin azithromycin.  Her pain was uncontrolled so was sent for observation.   Overnight patient did fairly well.  She is currently asymptomatic.  Pain is controlled.  Moving around in the hallway.  On room air.  She was started on Mucomyst and was able to expectorate a lot of sputum with improvement of symptoms.   Postviral cough, cough induced rib fracture, middle lobe atelectasis. -Continue ciprofloxacin to complete 7 days of therapy. -She will continue to use mucolytic's, incentive spirometry and flutter valve therapy at home. -Tylenol, oxycodone for moderate to severe pain, local lidocaine patch for rib fracture.  Already improving. -She has pulmonary nodule, will need follow-up.  will send referral to pulmonary clinic to schedule follow-up and further recommendations. Referral sent to [facility] pulmonary.  Discharge Diagnoses:  Principal Problem:   Rib fracture Active Problems:   Cirrhosis (HCC)   Hypertension   Atelectasis   Lung nodule

## 2022-06-07 ENCOUNTER — Ambulatory Visit: Payer: Medicare Other | Attending: Physician Assistant | Admitting: Physician Assistant

## 2022-06-07 ENCOUNTER — Encounter: Payer: Self-pay | Admitting: Physician Assistant

## 2022-06-07 VITALS — BP 136/86 | HR 82 | Ht 67.0 in | Wt 257.8 lb

## 2022-06-07 DIAGNOSIS — Z760 Encounter for issue of repeat prescription: Secondary | ICD-10-CM | POA: Diagnosis not present

## 2022-06-07 DIAGNOSIS — R911 Solitary pulmonary nodule: Secondary | ICD-10-CM | POA: Diagnosis not present

## 2022-06-07 DIAGNOSIS — M8448XD Pathological fracture, other site, subsequent encounter for fracture with routine healing: Secondary | ICD-10-CM | POA: Diagnosis not present

## 2022-06-07 DIAGNOSIS — I10 Essential (primary) hypertension: Secondary | ICD-10-CM | POA: Insufficient documentation

## 2022-06-07 DIAGNOSIS — K7460 Unspecified cirrhosis of liver: Secondary | ICD-10-CM | POA: Diagnosis not present

## 2022-06-07 DIAGNOSIS — D72829 Elevated white blood cell count, unspecified: Secondary | ICD-10-CM | POA: Diagnosis not present

## 2022-06-07 DIAGNOSIS — J9811 Atelectasis: Secondary | ICD-10-CM | POA: Insufficient documentation

## 2022-06-07 DIAGNOSIS — Z09 Encounter for follow-up examination after completed treatment for conditions other than malignant neoplasm: Secondary | ICD-10-CM | POA: Insufficient documentation

## 2022-06-07 DIAGNOSIS — S2231XD Fracture of one rib, right side, subsequent encounter for fracture with routine healing: Secondary | ICD-10-CM

## 2022-06-07 MED ORDER — LISINOPRIL 2.5 MG PO TABS: 2.5000 mg | ORAL_TABLET | Freq: Every day | ORAL | 3 refills | Status: AC

## 2022-06-07 MED ORDER — TRAMADOL HCL 50 MG PO TABS: 50.0000 mg | ORAL_TABLET | Freq: Three times a day (TID) | ORAL | 0 refills | Status: AC | PRN

## 2022-06-07 MED ORDER — CYCLOBENZAPRINE HCL 5 MG PO TABS: 5.0000 mg | ORAL_TABLET | Freq: Three times a day (TID) | ORAL | 1 refills | Status: AC | PRN

## 2022-06-10 NOTE — Progress Notes (Unsigned)
Synopsis: Referred for RLL nodule by Barb Merino, MD  Subjective:   PATIENT ID: Shirley Mays GENDER: female DOB: 1955/10/28, MRN: [id number]  No chief complaint on file.  67yF with history of alcohol use, cirrhosis referred for RLL 1cm solid pulmonary nodule   She was seen at [facility] 1/24 for post influenza cough and found to have RML atelectasis suspected due to mucus plugging, RML nodule, LLL nodular consolidation, rib fx. Sent with cipro course  Otherwise pertinent review of systems is negative.  Past Medical History:  Diagnosis Date   Alcohol abuse    Cirrhosis ([place])    Hiatal hernia    Pneumonia    Pyelonephritis    Sigmoid diverticulosis      Family History  Problem Relation Age of Onset   Liver disease Mother    Kidney disease Mother    Heart disease Mother    Cancer Neg Hx    Anemia Neg Hx    Leukemia Neg Hx    Thrombocytopenia Neg Hx      Past Surgical History:  Procedure Laterality Date   IR PARACENTESIS  10/16/2019   IR TRANSCATHETER BX  02/20/2020   IR US GUIDE VASC ACCESS RIGHT  02/20/2020   IR VENOGRAM HEPATIC W HEMODYNAMIC EVALUATION  02/20/2020   TUBAL LIGATION      Social History   Socioeconomic History   Marital status: Single    Spouse name: Not on file   Number of children: Not on file   Years of education: Not on file   Highest education level: Not on file  Occupational History   Not on file  Tobacco Use   Smoking status: Every Day    Packs/day: 0.50    Years: 30.00    Total pack years: 15.00    Types: Cigarettes   Smokeless tobacco: Never   Tobacco comments:    2-3 a day  Vaping Use   Vaping Use: Never used  Substance and Sexual Activity   Alcohol use: Not Currently    Comment: previously drank heavy about 3-6 drinks/day   Drug use: Never   Sexual activity: Not on file  Other Topics Concern   Not on file  Social History Narrative   Not on file   Social Determinants of Health   Financial Resource Strain: Not on file  Food  Insecurity: Not on file  Transportation Needs: Not on file  Physical Activity: Not on file  Stress: Not on file  Social Connections: Not on file  Intimate Partner Violence: Not on file     Allergies  Allergen Reactions   Prednisone     Swelling - "everything .. .joints mostly"     Outpatient Medications Prior to Visit  Medication Sig Dispense Refill   ciprofloxacin (CIPRO) 500 MG tablet Take 500 mg by mouth 2 (two) times daily. For 10 days     cyclobenzaprine (FLEXERIL) 5 MG tablet Take 1 tablet (5 mg total) by mouth 3 (three) times daily as needed for muscle spasms. 30 tablet 1   furosemide (LASIX) 40 MG tablet TAKE ONE TABLET BY MOUTH DAILY 90 tablet 1   guaiFENesin-dextromethorphan (ROBITUSSIN DM) 100-10 MG/5ML syrup Take 10 mLs by mouth every 4 (four) hours as needed for cough. 118 mL 0   lisinopril (ZESTRIL) 2.5 MG tablet Take 1 tablet (2.5 mg total) by mouth daily. 30 tablet 3   meloxicam (MOBIC) 7.5 MG tablet Take 1 tablet (7.5 mg total) by mouth daily. (Patient taking differently: Take 7.5  mg by mouth daily as needed for pain.) 30 tablet 1   Multiple Vitamin (MULTIVITAMIN WITH MINERALS) TABS tablet Take 1 tablet by mouth daily.     traMADol (ULTRAM) 50 MG tablet Take 1 tablet (50 mg total) by mouth every 8 (eight) hours as needed for up to 5 days. 15 tablet 0   No facility-administered medications prior to visit.       Objective:   Physical Exam:  General appearance: 67 y.o., female, NAD, conversant  Eyes: anicteric sclerae; PERRL, tracking appropriately HENT: NCAT; MMM Neck: Trachea midline; no lymphadenopathy, no JVD Lungs: CTAB, no crackles, no wheeze, with normal respiratory effort CV: RRR, no murmur  Abdomen: Soft, non-tender; non-distended, BS present  Extremities: No peripheral edema, warm Skin: Normal turgor and texture; no rash Psych: Appropriate affect Neuro: Alert and oriented to person and place, no focal deficit     There were no vitals filed for  this visit.   on *** LPM *** RA BMI Readings from Last 3 Encounters:  06/07/22 40.38 kg/m  05/25/22 40.47 kg/m  03/13/22 40.88 kg/m   Wt Readings from Last 3 Encounters:  06/07/22 257 lb 12.8 oz (116.9 kg)  05/25/22 258 lb 6.1 oz (117.2 kg)  03/13/22 261 lb (118.4 kg)     CBC    Component Value Date/Time   WBC 12.1 (H) 05/25/2022 0803   RBC 4.39 05/25/2022 0803   HGB 13.0 05/25/2022 0803   HGB 14.8 12/01/2021 1100   HCT 38.9 05/25/2022 0803   HCT 42.5 12/01/2021 1100   PLT 253 05/25/2022 0803   PLT 214 12/01/2021 1100   MCV 88.6 05/25/2022 0803   MCV 86 12/01/2021 1100   MCH 29.6 05/25/2022 0803   MCHC 33.4 05/25/2022 0803   RDW 13.5 05/25/2022 0803   RDW 12.2 12/01/2021 1100   LYMPHSABS 3.9 05/24/2022 0926   LYMPHSABS 3.3 (H) 12/01/2021 1100   MONOABS 1.0 05/24/2022 0926   EOSABS 0.1 05/24/2022 0926   EOSABS 0.2 12/01/2021 1100   BASOSABS 0.1 05/24/2022 0926   BASOSABS 0.1 12/01/2021 1100    ***  Chest Imaging: CTA Chest 05/24/22 reviewed by me with 1cm solid RLL nodule, RML atelectasis, nodular consolidation LLL   Pulmonary Functions Testing Results:     No data to display          FeNO: ***  Pathology: ***  Echocardiogram: ***  Heart Catheterization: ***    Assessment & Plan:    Plan:      Maryjane Hurter, MD [place] [facility] 06/10/2022 4:38 PM

## 2022-06-12 ENCOUNTER — Encounter: Payer: Self-pay | Admitting: Student

## 2022-06-12 ENCOUNTER — Ambulatory Visit (INDEPENDENT_AMBULATORY_CARE_PROVIDER_SITE_OTHER): Payer: Medicare Other | Admitting: Student

## 2022-06-12 VITALS — BP 128/76 | HR 80 | Temp 97.6°F | Ht 67.0 in | Wt 259.0 lb

## 2022-06-12 DIAGNOSIS — J432 Centrilobular emphysema: Secondary | ICD-10-CM

## 2022-06-12 DIAGNOSIS — R911 Solitary pulmonary nodule: Secondary | ICD-10-CM

## 2022-06-12 MED ORDER — GUAIFENESIN ER 600 MG PO TB12: 600.0000 mg | ORAL_TABLET | Freq: Two times a day (BID) | ORAL | 1 refills | Status: AC

## 2022-06-12 MED ORDER — STIOLTO RESPIMAT 2.5-2.5 MCG/ACT IN AERS: 2.0000 | INHALATION_SPRAY | Freq: Every day | RESPIRATORY_TRACT | 0 refills | Status: DC

## 2022-06-12 NOTE — Addendum Note (Signed)
Addended by: Valerie Salts on: 06/12/2022 03:33 PM   Modules accepted: Orders

## 2022-06-12 NOTE — Addendum Note (Signed)
Addended by: Rosana Berger on: 06/12/2022 03:20 PM   Modules accepted: Orders

## 2022-06-12 NOTE — Patient Instructions (Addendum)
-   CT Chest on or around 07/19/22 you will be called to schedule - mucinex 600 mg twice daily when you have chest congestion to thin mucus  - can try stiolto 2 puffs once daily or 1 puff twice daily FOLLOWED BY - flutter valve 10 slow but firm puffs twice daily

## 2022-06-28 ENCOUNTER — Ambulatory Visit: Payer: Medicare Other | Attending: Family Medicine

## 2022-07-19 ENCOUNTER — Telehealth: Payer: Self-pay | Admitting: Family Medicine

## 2022-07-19 ENCOUNTER — Ambulatory Visit (HOSPITAL_COMMUNITY)
Admission: RE | Admit: 2022-07-19 | Discharge: 2022-07-19 | Disposition: A | Discharge status: Home / Self Care | Payer: Medicare Other | Source: Ambulatory Visit | Attending: Student | Admitting: Student

## 2022-07-19 ENCOUNTER — Encounter (HOSPITAL_COMMUNITY): Payer: Self-pay

## 2022-07-19 DIAGNOSIS — R911 Solitary pulmonary nodule: Secondary | ICD-10-CM | POA: Insufficient documentation

## 2022-07-19 NOTE — Telephone Encounter (Signed)
Contacted [facility] to schedule their annual wellness visit. Appointment made for 07/26/22.  Barkley Boards AWV direct phone # [phone]

## 2022-07-26 ENCOUNTER — Ambulatory Visit: Payer: Medicare Other | Attending: Family Medicine

## 2022-07-26 VITALS — Ht 67.0 in | Wt 250.0 lb

## 2022-07-26 DIAGNOSIS — Z0000 Encounter for general adult medical examination without abnormal findings: Secondary | ICD-10-CM

## 2022-07-26 NOTE — Progress Notes (Signed)
I connected with  Shirley Mays on 07/26/22 by a audio enabled telemedicine application and verified that I am speaking with the correct person using two identifiers.  Patient Location: Home  Provider Location: Office/Clinic  I discussed the limitations of evaluation and management by telemedicine. The patient expressed understanding and agreed to proceed.  Subjective:   Shirley Mays is a 67 y.o. female who presents for an Initial Medicare Annual Wellness Visit.  Review of Systems     Cardiac Risk Factors include: advanced age (>71men, >6 women);smoking/ tobacco exposure;hypertension;obesity (BMI >30kg/m2)     Objective:    Today's Vitals   07/26/22 1626  Weight: 250 lb (113.4 kg)  Height: 5\' 7"  (1.702 m)   Body mass index is 39.16 kg/m.     07/26/2022    4:32 PM 05/24/2022    8:56 AM 01/09/2022   11:17 AM 03/17/2020   10:43 AM 02/20/2020    8:20 AM 01/02/2020   11:15 AM 12/04/2019    1:29 PM  Advanced Directives  Does Patient Have a Medical Advance Directive? No No No No No No No  Would patient like information on creating a medical advance directive?   No - Patient declined No - Patient declined No - Patient declined No - Patient declined     Current Medications (verified) Outpatient Encounter Medications as of 07/26/2022  Medication Sig   cyclobenzaprine (FLEXERIL) 5 MG tablet Take 1 tablet (5 mg total) by mouth 3 (three) times daily as needed for muscle spasms.   furosemide (LASIX) 40 MG tablet TAKE ONE TABLET BY MOUTH DAILY   guaiFENesin (MUCINEX) 600 MG 12 hr tablet Take 1 tablet (600 mg total) by mouth 2 (two) times daily. (Patient taking differently: Take 600 mg by mouth 2 (two) times daily as needed.)   guaiFENesin-dextromethorphan (ROBITUSSIN DM) 100-10 MG/5ML syrup Take 10 mLs by mouth every 4 (four) hours as needed for cough.   lisinopril (ZESTRIL) 2.5 MG tablet Take 1 tablet (2.5 mg total) by mouth daily.   meloxicam (MOBIC) 7.5 MG tablet Take 1 tablet (7.5 mg  total) by mouth daily. (Patient taking differently: Take 7.5 mg by mouth daily as needed for pain.)   Multiple Vitamin (MULTIVITAMIN WITH MINERALS) TABS tablet Take 1 tablet by mouth daily.   Tiotropium Bromide-Olodaterol (STIOLTO RESPIMAT) 2.5-2.5 MCG/ACT AERS Inhale 2 puffs into the lungs daily.   No facility-administered encounter medications on file as of 07/26/2022.    Allergies (verified) Prednisone   History: Past Medical History:  Diagnosis Date   Alcohol abuse    Cirrhosis ([place])    Hiatal hernia    Pneumonia    Pyelonephritis    Sigmoid diverticulosis    Past Surgical History:  Procedure Laterality Date   IR PARACENTESIS  10/16/2019   IR TRANSCATHETER BX  02/20/2020   IR US GUIDE VASC ACCESS RIGHT  02/20/2020   IR VENOGRAM HEPATIC W HEMODYNAMIC EVALUATION  02/20/2020   TUBAL LIGATION     Family History  Problem Relation Age of Onset   Liver disease Mother    Kidney disease Mother    Heart disease Mother    Cancer Neg Hx    Anemia Neg Hx    Leukemia Neg Hx    Thrombocytopenia Neg Hx    Social History   Socioeconomic History   Marital status: Single    Spouse name: Not on file   Number of children: Not on file   Years of education: Not on file   Highest education  level: Not on file  Occupational History   Not on file  Tobacco Use   Smoking status: Every Day    Packs/day: 1.00    Years: 50.00    Additional pack years: 0.00    Total pack years: 50.00    Types: Cigarettes   Smokeless tobacco: Never   Tobacco comments:    2-3 a day 06/12/22   Vaping Use   Vaping Use: Never used  Substance and Sexual Activity   Alcohol use: Not Currently    Comment: previously drank heavy about 3-6 drinks/day   Drug use: Never   Sexual activity: Not on file  Other Topics Concern   Not on file  Social History Narrative   Not on file   Social Determinants of Health   Financial Resource Strain: Low Risk  (07/26/2022)   Overall Financial Resource Strain (CARDIA)     Difficulty of Paying Living Expenses: Not hard at all  Food Insecurity: No Food Insecurity (07/26/2022)   Hunger Vital Sign    Worried About Running Out of Food in the Last Year: Never true    Ran Out of Food in the Last Year: Never true  Transportation Needs: No Transportation Needs (07/26/2022)   PRAPARE - Hydrologist (Medical): No    Lack of Transportation (Non-Medical): No  Physical Activity: Insufficiently Active (07/26/2022)   Exercise Vital Sign    Days of Exercise per Week: 7 days    Minutes of Exercise per Session: 10 min  Stress: No Stress Concern Present (07/26/2022)   [place]    Feeling of Stress : Not at all  Social Connections: Not on file    Tobacco Counseling Ready to quit: Yes Counseling given: Not Answered Tobacco comments: 2-3 a day 06/12/22    Clinical Intake:  Pre-visit preparation completed: Yes  Pain : No/denies pain     Nutritional Status: BMI > 30  Obese Nutritional Risks: None Diabetes: No  How often do you need to have someone help you when you read instructions, pamphlets, or other written materials from your doctor or pharmacy?: 1 - Never  Diabetic? no  Interpreter Needed?: No  Information entered by :: NAllen LPN   Activities of Daily Living    07/26/2022    4:33 PM  In your present state of health, do you have any difficulty performing the following activities:  Hearing? 0  Vision? 0  Difficulty concentrating or making decisions? 1  Walking or climbing stairs? 0  Dressing or bathing? 0  Doing errands, shopping? 0  Preparing Food and eating ? N  Using the Toilet? N  In the past six months, have you accidently leaked urine? Y  Comment due to coughing  Do you have problems with loss of bowel control? N  Managing your Medications? N  Managing your Finances? N  Housekeeping or managing your Housekeeping? N    Patient Care  Team: Charlott Rakes, MD as PCP - General (Family Medicine)  Indicate any recent Medical Services you may have received from other than [facility] providers in the past year (date may be approximate).     Assessment:   This is a routine wellness examination for Shirley Mays.  Hearing/Vision screen Vision Screening - Comments:: Regular eye exams, [facility]  Dietary issues and exercise activities discussed: Current Exercise Habits: Home exercise routine, Type of exercise: stretching, Frequency (Times/Week): 7   Goals Addressed  This Visit's Progress    Patient Stated       07/26/2022, wants to lose more weight       Depression Screen    07/26/2022    4:33 PM 12/01/2021   11:15 AM 06/02/2020   11:29 AM 01/07/2020   10:38 AM 11/19/2019    1:43 PM  PHQ 2/9 Scores  PHQ - 2 Score 0 2 0 0 2  PHQ- 9 Score   4 7 9     Fall Risk    07/26/2022    4:32 PM 06/07/2022   10:22 AM 03/13/2022   11:10 AM 12/01/2021   10:42 AM 06/02/2020   11:25 AM  [place] in the past year? 0 0 0 0 0  Number falls in past yr: 0 0 0 0 0  Injury with Fall? 0 0 0 0 0  Risk for fall due to : Medication side effect No Fall Risks No Fall Risks No Fall Risks   Follow up Falls prevention discussed;Education provided;Falls evaluation completed        FALL RISK PREVENTION PERTAINING TO THE HOME:  Any stairs in or around the home? No  If so, are there any without handrails? N/a Home free of loose throw rugs in walkways, pet beds, electrical cords, etc? Yes  Adequate lighting in your home to reduce risk of falls? Yes   ASSISTIVE DEVICES UTILIZED TO PREVENT FALLS:  Life alert? No  Use of a cane, walker or w/c? No  Grab bars in the bathroom? No  Shower chair or bench in shower? No  Elevated toilet seat or a handicapped toilet? Yes   TIMED UP AND GO:  Was the test performed? No .      Cognitive Function:        07/26/2022    4:34 PM  6CIT Screen  What Year? 0 points  What month? 0  points  What time? 0 points  Count back from 20 0 points  Months in reverse 0 points  Repeat phrase 0 points  Total Score 0 points    Immunizations Immunization History  Administered Date(s) Administered   Hep A / Hep B 12/29/2019, 01/29/2020, 06/30/2020    TDAP status: Due, Education has been provided regarding the importance of this vaccine. Advised may receive this vaccine at local pharmacy or Health Dept. Aware to provide a copy of the vaccination record if obtained from local pharmacy or Health Dept. Verbalized acceptance and understanding.  Flu Vaccine status: Declined, Education has been provided regarding the importance of this vaccine but patient still declined. Advised may receive this vaccine at local pharmacy or Health Dept. Aware to provide a copy of the vaccination record if obtained from local pharmacy or Health Dept. Verbalized acceptance and understanding.  Pneumococcal vaccine status: Declined,  Education has been provided regarding the importance of this vaccine but patient still declined. Advised may receive this vaccine at local pharmacy or Health Dept. Aware to provide a copy of the vaccination record if obtained from local pharmacy or Health Dept. Verbalized acceptance and understanding.   Covid-19 vaccine status: Declined, Education has been provided regarding the importance of this vaccine but patient still declined. Advised may receive this vaccine at local pharmacy or Health Dept.or vaccine clinic. Aware to provide a copy of the vaccination record if obtained from local pharmacy or Health Dept. Verbalized acceptance and understanding.  Qualifies for Shingles Vaccine? Yes   Zostavax completed No   Shingrix Completed?: No.  Education has been provided regarding the importance of this vaccine. Patient has been advised to call insurance company to determine out of pocket expense if they have not yet received this vaccine. Advised may also receive vaccine at local  pharmacy or Health Dept. Verbalized acceptance and understanding.  Screening Tests Health Maintenance  Topic Date Due   Medicare Annual Wellness (AWV)  Never done   COVID-19 Vaccine (1) Never done   DTaP/Tdap/Td (1 - Tdap) Never done   COLONOSCOPY (Pts 45-64yrs Insurance coverage will need to be confirmed)  Never done   MAMMOGRAM  Never done   Zoster Vaccines- Shingrix (1 of 2) Never done   DEXA SCAN  Never done   INFLUENZA VACCINE  07/30/2022 (Originally 11/29/2021)   Pneumonia Vaccine 73+ Years old (1 of 2 - PCV) 03/14/2023 (Originally 05/09/1961)   Lung Cancer Screening  07/19/2023   Hepatitis C Screening  Completed   HPV VACCINES  Aged Out    Health Maintenance  Health Maintenance Due  Topic Date Due   Medicare Annual Wellness (AWV)  Never done   COVID-19 Vaccine (1) Never done   DTaP/Tdap/Td (1 - Tdap) Never done   COLONOSCOPY (Pts 45-58yrs Insurance coverage will need to be confirmed)  Never done   MAMMOGRAM  Never done   Zoster Vaccines- Shingrix (1 of 2) Never done   DEXA SCAN  Never done    Colorectal cancer screening: due  Mammogram status: due  Bone Density status: due  Lung Cancer Screening: (Low Dose CT Chest recommended if Age 93-80 years, 30 pack-year currently smoking OR have quit w/in 15years.) does qualify.   Lung Cancer Screening Referral: CT scan 07/19/2022  Additional Screening:  Hepatitis C Screening: does qualify; Completed 10/17/2019  Vision Screening: Recommended annual ophthalmology exams for early detection of glaucoma and other disorders of the eye. Is the patient up to date with their annual eye exam?  Yes  Who is the provider or what is the name of the office in which the patient attends annual eye exams? [facility] If pt is not established with a provider, would they like to be referred to a provider to establish care? No .   Dental Screening: Recommended annual dental exams for proper oral hygiene  Community Resource Referral / Chronic  Care Management: CRR required this visit?  No   CCM required this visit?  No      Plan:     I have personally reviewed and noted the following in the patient's chart:   Medical and social history Use of alcohol, tobacco or illicit drugs  Current medications and supplements including opioid prescriptions. Patient is not currently taking opioid prescriptions. Functional ability and status Nutritional status Physical activity Advanced directives List of other physicians Hospitalizations, surgeries, and ER visits in previous 12 months Vitals Screenings to include cognitive, depression, and falls Referrals and appointments  In addition, I have reviewed and discussed with patient certain preventive protocols, quality metrics, and best practice recommendations. A written personalized care plan for preventive services as well as general preventive health recommendations were provided to patient.     Kellie Simmering, LPN   [id number]   Nurse Notes: none  Due to this being a virtual visit, the after visit summary with patients personalized plan was offered to patient via mail or my-chart. to pick up at office at next visit

## 2022-07-26 NOTE — Patient Instructions (Signed)
Shirley Mays , Thank you for taking time to come for your Medicare Wellness Visit. I appreciate your ongoing commitment to your health goals. Please review the following plan we discussed and let me know if I can assist you in the future.   These are the goals we discussed:  Goals      Patient Stated     07/26/2022, wants to lose more weight        This is a list of the screening recommended for you and due dates:  Health Maintenance  Topic Date Due   COVID-19 Vaccine (1) Never done   DTaP/Tdap/Td vaccine (1 - Tdap) Never done   Colon Cancer Screening  Never done   Mammogram  Never done   Zoster (Shingles) Vaccine (1 of 2) Never done   DEXA scan (bone density measurement)  Never done   Flu Shot  07/30/2022*   Pneumonia Vaccine (1 of 2 - PCV) 03/14/2023*   Screening for Lung Cancer  07/19/2023   Medicare Annual Wellness Visit  07/26/2023   Hepatitis C Screening: USPSTF Recommendation to screen - Ages 18-79 yo.  Completed   HPV Vaccine  Aged Out  *Topic was postponed. The date shown is not the original due date.    Advanced directives: Advance directive discussed with you today.   Conditions/risks identified: smoking  Next appointment: Follow up in one year for your annual wellness visit    Preventive Care 67 Years and Older, Female Preventive care refers to lifestyle choices and visits with your health care provider that can promote health and wellness. What does preventive care include? A yearly physical exam. This is also called an annual well check. Dental exams once or twice a year. Routine eye exams. Ask your health care provider how often you should have your eyes checked. Personal lifestyle choices, including: Daily care of your teeth and gums. Regular physical activity. Eating a healthy diet. Avoiding tobacco and drug use. Limiting alcohol use. Practicing safe sex. Taking low-dose aspirin every day. Taking vitamin and mineral supplements as recommended by your  health care provider. What happens during an annual well check? The services and screenings done by your health care provider during your annual well check will depend on your age, overall health, lifestyle risk factors, and family history of disease. Counseling  Your health care provider may ask you questions about your: Alcohol use. Tobacco use. Drug use. Emotional well-being. Home and relationship well-being. Sexual activity. Eating habits. History of falls. Memory and ability to understand (cognition). Work and work Statistician. Reproductive health. Screening  You may have the following tests or measurements: Height, weight, and BMI. Blood pressure. Lipid and cholesterol levels. These may be checked every 5 years, or more frequently if you are over 49 years old. Skin check. Lung cancer screening. You may have this screening every year starting at age 33 if you have a 30-pack-year history of smoking and currently smoke or have quit within the past 15 years. Fecal occult blood test (FOBT) of the stool. You may have this test every year starting at age 66. Flexible sigmoidoscopy or colonoscopy. You may have a sigmoidoscopy every 5 years or a colonoscopy every 10 years starting at age 24. Hepatitis C blood test. Hepatitis B blood test. Sexually transmitted disease (STD) testing. Diabetes screening. This is done by checking your blood sugar (glucose) after you have not eaten for a while (fasting). You may have this done every 1-3 years. Bone density scan. This is done to  screen for osteoporosis. You may have this done starting at age 93. Mammogram. This may be done every 1-2 years. Talk to your health care provider about how often you should have regular mammograms. Talk with your health care provider about your test results, treatment options, and if necessary, the need for more tests. Vaccines  Your health care provider may recommend certain vaccines, such as: Influenza vaccine.  This is recommended every year. Tetanus, diphtheria, and acellular pertussis (Tdap, Td) vaccine. You may need a Td booster every 10 years. Zoster vaccine. You may need this after age 59. Pneumococcal 13-valent conjugate (PCV13) vaccine. One dose is recommended after age 39. Pneumococcal polysaccharide (PPSV23) vaccine. One dose is recommended after age 10. Talk to your health care provider about which screenings and vaccines you need and how often you need them. This information is not intended to replace advice given to you by your health care provider. Make sure you discuss any questions you have with your health care provider. Document Released: 05/14/2015 Document Revised: 01/05/2016 Document Reviewed: 02/16/2015 Elsevier Interactive Patient Education  2017 [employer] Prevention in the Home Falls can cause injuries. They can happen to people of all ages. There are many things you can do to make your home safe and to help prevent falls. What can I do on the outside of my home? Regularly fix the edges of walkways and driveways and fix any cracks. Remove anything that might make you trip as you walk through a door, such as a raised step or threshold. Trim any bushes or trees on the path to your home. Use bright outdoor lighting. Clear any walking paths of anything that might make someone trip, such as rocks or tools. Regularly check to see if handrails are loose or broken. Make sure that both sides of any steps have handrails. Any raised decks and porches should have guardrails on the edges. Have any leaves, snow, or ice cleared regularly. Use sand or salt on walking paths during winter. Clean up any spills in your garage right away. This includes oil or grease spills. What can I do in the bathroom? Use night lights. Install grab bars by the toilet and in the tub and shower. Do not use towel bars as grab bars. Use non-skid mats or decals in the tub or shower. If you need to sit  down in the shower, use a plastic, non-slip stool. Keep the floor dry. Clean up any water that spills on the floor as soon as it happens. Remove soap buildup in the tub or shower regularly. Attach bath mats securely with double-sided non-slip rug tape. Do not have throw rugs and other things on the floor that can make you trip. What can I do in the bedroom? Use night lights. Make sure that you have a light by your bed that is easy to reach. Do not use any sheets or blankets that are too big for your bed. They should not hang down onto the floor. Have a firm chair that has side arms. You can use this for support while you get dressed. Do not have throw rugs and other things on the floor that can make you trip. What can I do in the kitchen? Clean up any spills right away. Avoid walking on wet floors. Keep items that you use a lot in easy-to-reach places. If you need to reach something above you, use a strong step stool that has a grab bar. Keep electrical cords out of the way.  Do not use floor polish or wax that makes floors slippery. If you must use wax, use non-skid floor wax. Do not have throw rugs and other things on the floor that can make you trip. What can I do with my stairs? Do not leave any items on the stairs. Make sure that there are handrails on both sides of the stairs and use them. Fix handrails that are broken or loose. Make sure that handrails are as long as the stairways. Check any carpeting to make sure that it is firmly attached to the stairs. Fix any carpet that is loose or worn. Avoid having throw rugs at the top or bottom of the stairs. If you do have throw rugs, attach them to the floor with carpet tape. Make sure that you have a light switch at the top of the stairs and the bottom of the stairs. If you do not have them, ask someone to add them for you. What else can I do to help prevent falls? Wear shoes that: Do not have high heels. Have rubber bottoms. Are  comfortable and fit you well. Are closed at the toe. Do not wear sandals. If you use a stepladder: Make sure that it is fully opened. Do not climb a closed stepladder. Make sure that both sides of the stepladder are locked into place. Ask someone to hold it for you, if possible. Clearly mark and make sure that you can see: Any grab bars or handrails. First and last steps. Where the edge of each step is. Use tools that help you move around (mobility aids) if they are needed. These include: Canes. Walkers. Scooters. Crutches. Turn on the lights when you go into a dark area. Replace any light bulbs as soon as they burn out. Set up your furniture so you have a clear path. Avoid moving your furniture around. If any of your floors are uneven, fix them. If there are any pets around you, be aware of where they are. Review your medicines with your doctor. Some medicines can make you feel dizzy. This can increase your chance of falling. Ask your doctor what other things that you can do to help prevent falls. This information is not intended to replace advice given to you by your health care provider. Make sure you discuss any questions you have with your health care provider. Document Released: 02/11/2009 Document Revised: 09/23/2015 Document Reviewed: 05/22/2014 Elsevier Interactive Patient Education  2017 [employer].

## 2022-07-31 ENCOUNTER — Ambulatory Visit: Payer: Medicare Other | Admitting: Family Medicine

## 2022-08-01 NOTE — Progress Notes (Unsigned)
Synopsis: Referred for RLL nodule by Charlott Rakes, MD  Subjective:   PATIENT ID: Shirley Mays GENDER: female DOB: Apr 28, 1956, MRN: [id number]  No chief complaint on file.  67yF with history of alcohol use, cirrhosis referred for RLL 1cm solid pulmonary nodule   She was seen at [facility] for post influenza cough and found to have RML atelectasis suspected due to mucus plugging, RML nodule, LLL nodular consolidation, rib fx. Did have a little hemoptysis at that time. Sent with cipro course  Less rib pain since hospitalization. Still coughs some, productive of thick whitish sputum. Some DOE post hospitalization.   She smoked for 30 years 0.5ppd, down to 2-3 cigarettes per day.   No family history of lung cancer  She worked as a Human resources officer in past. She has lived in [place] years ago, has been in [place] since 1992. No MJ, vaping.   Interval HPI  CT Chest Super D 07/20/22 with stable/slightly smaller RLL nodule, resolution RML atelectasis and LLL opacities, interval small R pleural effusion. Centrilobular and paraseptal emphysema  Started on stiolto, mucinex, flutter valve last visit    Otherwise pertinent review of systems is negative.  Past Medical History:  Diagnosis Date   Alcohol abuse    Cirrhosis ([place])    Hiatal hernia    Pneumonia    Pyelonephritis    Sigmoid diverticulosis      Family History  Problem Relation Age of Onset   Liver disease Mother    Kidney disease Mother    Heart disease Mother    Cancer Neg Hx    Anemia Neg Hx    Leukemia Neg Hx    Thrombocytopenia Neg Hx      Past Surgical History:  Procedure Laterality Date   IR PARACENTESIS  10/16/2019   IR TRANSCATHETER BX  02/20/2020   IR US GUIDE VASC ACCESS RIGHT  02/20/2020   IR VENOGRAM HEPATIC W HEMODYNAMIC EVALUATION  02/20/2020   TUBAL LIGATION      Social History   Socioeconomic History   Marital status: Single    Spouse name: Not on file   Number of children: Not on file   Years of education:  Not on file   Highest education level: Not on file  Occupational History   Not on file  Tobacco Use   Smoking status: Every Day    Packs/day: 1.00    Years: 50.00    Additional pack years: 0.00    Total pack years: 50.00    Types: Cigarettes   Smokeless tobacco: Never   Tobacco comments:    2-3 a day 06/12/22   Vaping Use   Vaping Use: Never used  Substance and Sexual Activity   Alcohol use: Not Currently    Comment: previously drank heavy about 3-6 drinks/day   Drug use: Never   Sexual activity: Not on file  Other Topics Concern   Not on file  Social History Narrative   Not on file   Social Determinants of Health   Financial Resource Strain: Low Risk  (07/26/2022)   Overall Financial Resource Strain (CARDIA)    Difficulty of Paying Living Expenses: Not hard at all  Food Insecurity: No Food Insecurity (07/26/2022)   Hunger Vital Sign    Worried About Running Out of Food in the Last Year: Never true    Ran Out of Food in the Last Year: Never true  Transportation Needs: No Transportation Needs (07/26/2022)   PRAPARE - Transportation    Lack of  Transportation (Medical): No    Lack of Transportation (Non-Medical): No  Physical Activity: Insufficiently Active (07/26/2022)   Exercise Vital Sign    Days of Exercise per Week: 7 days    Minutes of Exercise per Session: 10 min  Stress: No Stress Concern Present (07/26/2022)   [place]    Feeling of Stress : Not at all  Social Connections: Not on file  Intimate Partner Violence: Not on file     Allergies  Allergen Reactions   Prednisone     Swelling - "everything .. .joints mostly"     Outpatient Medications Prior to Visit  Medication Sig Dispense Refill   cyclobenzaprine (FLEXERIL) 5 MG tablet Take 1 tablet (5 mg total) by mouth 3 (three) times daily as needed for muscle spasms. 30 tablet 1   furosemide (LASIX) 40 MG tablet TAKE ONE TABLET BY MOUTH DAILY  90 tablet 1   guaiFENesin (MUCINEX) 600 MG 12 hr tablet Take 1 tablet (600 mg total) by mouth 2 (two) times daily. (Patient taking differently: Take 600 mg by mouth 2 (two) times daily as needed.) 60 tablet 1   guaiFENesin-dextromethorphan (ROBITUSSIN DM) 100-10 MG/5ML syrup Take 10 mLs by mouth every 4 (four) hours as needed for cough. 118 mL 0   lisinopril (ZESTRIL) 2.5 MG tablet Take 1 tablet (2.5 mg total) by mouth daily. 30 tablet 3   meloxicam (MOBIC) 7.5 MG tablet Take 1 tablet (7.5 mg total) by mouth daily. (Patient taking differently: Take 7.5 mg by mouth daily as needed for pain.) 30 tablet 1   Multiple Vitamin (MULTIVITAMIN WITH MINERALS) TABS tablet Take 1 tablet by mouth daily.     Tiotropium Bromide-Olodaterol (STIOLTO RESPIMAT) 2.5-2.5 MCG/ACT AERS Inhale 2 puffs into the lungs daily. 8 g 0   No facility-administered medications prior to visit.       Objective:   Physical Exam:  General appearance: 67 y.o., female, NAD, conversant  Eyes: anicteric sclerae; PERRL, tracking appropriately HENT: NCAT; MMM Neck: Trachea midline; no lymphadenopathy, no JVD Lungs: CTAB, no crackles, no wheeze, with normal respiratory effort CV: RRR, no murmur  Abdomen: Soft, non-tender; non-distended, BS present  Extremities: No peripheral edema, warm Skin: Normal turgor and texture; no rash Psych: Appropriate affect Neuro: Alert and oriented to person and place, no focal deficit     There were no vitals filed for this visit.    on RA BMI Readings from Last 3 Encounters:  07/26/22 39.16 kg/m  06/12/22 40.57 kg/m  06/07/22 40.38 kg/m   Wt Readings from Last 3 Encounters:  07/26/22 250 lb (113.4 kg)  06/12/22 259 lb (117.5 kg)  06/07/22 257 lb 12.8 oz (116.9 kg)     CBC    Component Value Date/Time   WBC 12.1 (H) 05/25/2022 0803   RBC 4.39 05/25/2022 0803   HGB 13.0 05/25/2022 0803   HGB 14.8 12/01/2021 1100   HCT 38.9 05/25/2022 0803   HCT 42.5 12/01/2021 1100   PLT  253 05/25/2022 0803   PLT 214 12/01/2021 1100   MCV 88.6 05/25/2022 0803   MCV 86 12/01/2021 1100   MCH 29.6 05/25/2022 0803   MCHC 33.4 05/25/2022 0803   RDW 13.5 05/25/2022 0803   RDW 12.2 12/01/2021 1100   LYMPHSABS 3.9 05/24/2022 0926   LYMPHSABS 3.3 (H) 12/01/2021 1100   MONOABS 1.0 05/24/2022 0926   EOSABS 0.1 05/24/2022 0926   EOSABS 0.2 12/01/2021 1100   BASOSABS 0.1 05/24/2022 0926  BASOSABS 0.1 12/01/2021 1100      Chest Imaging: CTA Chest 05/24/22 reviewed by me with 1cm solid RLL nodule, RML atelectasis, nodular consolidation LLL   CT Chest Super D 07/20/22 with stable/slightly smaller RLL nodule, resolution RML atelectasis and LLL opacities, interval small R pleural effusion. Centrilobular and paraseptal emphysema  Pulmonary Functions Testing Results:     No data to display              Assessment & Plan:   # RLL 1cm solid nodule # RML atelectasis  # recent multifocal pneumonia, influenza A infection  # emphysema # bronchial wall thickening At risk for copd given smoking history  Plan: - CT Chest on or around 07/19/22 you will be called to schedule - mucinex 600 mg twice daily when you have chest congestion to thin mucus  - can try stiolto 2 puffs once daily or 1 puff twice daily FOLLOWED BY - flutter valve 10 slow but firm puffs twice daily       Maryjane Hurter, MD [facility] 08/01/2022 7:02 AM

## 2022-08-02 ENCOUNTER — Telehealth: Payer: Self-pay | Admitting: Student

## 2022-08-02 ENCOUNTER — Encounter: Payer: Self-pay | Admitting: Student

## 2022-08-02 ENCOUNTER — Ambulatory Visit (INDEPENDENT_AMBULATORY_CARE_PROVIDER_SITE_OTHER): Payer: Medicare Other | Admitting: Student

## 2022-08-02 VITALS — BP 122/70 | HR 84 | Temp 98.4°F | Ht 67.0 in | Wt 263.0 lb

## 2022-08-02 DIAGNOSIS — R911 Solitary pulmonary nodule: Secondary | ICD-10-CM | POA: Diagnosis not present

## 2022-08-02 DIAGNOSIS — J432 Centrilobular emphysema: Secondary | ICD-10-CM

## 2022-08-02 MED ORDER — STIOLTO RESPIMAT 2.5-2.5 MCG/ACT IN AERS: 2.0000 | INHALATION_SPRAY | Freq: Every day | RESPIRATORY_TRACT | 0 refills | Status: DC

## 2022-08-02 MED ORDER — ALBUTEROL SULFATE HFA 108 (90 BASE) MCG/ACT IN AERS: 1.0000 | INHALATION_SPRAY | Freq: Four times a day (QID) | RESPIRATORY_TRACT | 11 refills | Status: AC | PRN

## 2022-08-02 NOTE — Telephone Encounter (Signed)
What is preferred laba/lama for her insurer?  Thanks!

## 2022-08-02 NOTE — Patient Instructions (Signed)
-   CT Chest in 6 months - can try stiolto 2 puffs once daily  - when you have sensation of chest congestion, productive cough: - mucinex 600 mg twice daily to thin mucus  - flutter valve 10 slow but firm puffs twice daily

## 2022-08-03 ENCOUNTER — Other Ambulatory Visit (HOSPITAL_COMMUNITY): Payer: Self-pay

## 2022-08-03 NOTE — Telephone Encounter (Signed)
Covered LABA/LAMA at this time are:  [employer] Ell[employer]a- $4.60 [employer] Aerosphere- $4.60

## 2022-08-04 MED ORDER — ANORO ELLIPTA 62.5-25 MCG/ACT IN AEPB: 1.0000 | INHALATION_SPRAY | Freq: Every day | RESPIRATORY_TRACT | 11 refills | Status: AC

## 2022-10-16 ENCOUNTER — Other Ambulatory Visit: Payer: Self-pay | Admitting: Family Medicine

## 2022-10-16 DIAGNOSIS — M25551 Pain in right hip: Secondary | ICD-10-CM

## 2022-12-06 ENCOUNTER — Ambulatory Visit: Payer: Medicare Other | Admitting: Family Medicine

## 2023-02-07 ENCOUNTER — Ambulatory Visit
Admission: RE | Admit: 2023-02-07 | Discharge: 2023-02-07 | Disposition: A | Discharge status: Home / Self Care | Payer: Medicare Other | Source: Ambulatory Visit | Attending: Student | Admitting: Student

## 2023-02-07 DIAGNOSIS — R911 Solitary pulmonary nodule: Secondary | ICD-10-CM

## 2023-07-31 ENCOUNTER — Ambulatory Visit: Payer: Medicare Other | Attending: Family Medicine
